# Patient Record
Sex: Female | Born: 1960 | Race: White | Hispanic: No | Marital: Married | Smoking: Never smoker
Health system: Southern US, Community
[De-identification: ages and names within clinical notes are randomized; demographics above are authoritative.]

## PROBLEM LIST (undated history)

## (undated) DIAGNOSIS — G478 Other sleep disorders: Secondary | ICD-10-CM

## (undated) DIAGNOSIS — R569 Unspecified convulsions: Secondary | ICD-10-CM

## (undated) DIAGNOSIS — D219 Benign neoplasm of connective and other soft tissue, unspecified: Secondary | ICD-10-CM

## (undated) DIAGNOSIS — G43909 Migraine, unspecified, not intractable, without status migrainosus: Secondary | ICD-10-CM

## (undated) DIAGNOSIS — L718 Other rosacea: Secondary | ICD-10-CM

## (undated) DIAGNOSIS — R748 Abnormal levels of other serum enzymes: Secondary | ICD-10-CM

## (undated) DIAGNOSIS — L439 Lichen planus, unspecified: Secondary | ICD-10-CM

## (undated) DIAGNOSIS — I341 Nonrheumatic mitral (valve) prolapse: Secondary | ICD-10-CM

## (undated) DIAGNOSIS — K5790 Diverticulosis of intestine, part unspecified, without perforation or abscess without bleeding: Secondary | ICD-10-CM

## (undated) DIAGNOSIS — M1990 Unspecified osteoarthritis, unspecified site: Secondary | ICD-10-CM

## (undated) DIAGNOSIS — K760 Fatty (change of) liver, not elsewhere classified: Secondary | ICD-10-CM

## (undated) DIAGNOSIS — T8859XA Other complications of anesthesia, initial encounter: Secondary | ICD-10-CM

## (undated) DIAGNOSIS — K529 Noninfective gastroenteritis and colitis, unspecified: Secondary | ICD-10-CM

## (undated) DIAGNOSIS — T4145XA Adverse effect of unspecified anesthetic, initial encounter: Secondary | ICD-10-CM

## (undated) DIAGNOSIS — K900 Celiac disease: Secondary | ICD-10-CM

## (undated) DIAGNOSIS — R011 Cardiac murmur, unspecified: Secondary | ICD-10-CM

## (undated) DIAGNOSIS — D510 Vitamin B12 deficiency anemia due to intrinsic factor deficiency: Secondary | ICD-10-CM

## (undated) DIAGNOSIS — G629 Polyneuropathy, unspecified: Secondary | ICD-10-CM

## (undated) DIAGNOSIS — J399 Disease of upper respiratory tract, unspecified: Secondary | ICD-10-CM

## (undated) HISTORY — DX: Vitamin B12 deficiency anemia due to intrinsic factor deficiency: D51.0

## (undated) HISTORY — DX: Noninfective gastroenteritis and colitis, unspecified: K52.9

## (undated) HISTORY — DX: Fatty (change of) liver, not elsewhere classified: K76.0

## (undated) HISTORY — PX: KNEE SURGERY: SHX244

## (undated) HISTORY — DX: Polyneuropathy, unspecified: G62.9

## (undated) HISTORY — DX: Abnormal levels of other serum enzymes: R74.8

## (undated) HISTORY — DX: Celiac disease: K90.0

## (undated) HISTORY — DX: Migraine, unspecified, not intractable, without status migrainosus: G43.909

## (undated) HISTORY — DX: Nonrheumatic mitral (valve) prolapse: I34.1

## (undated) HISTORY — DX: Benign neoplasm of connective and other soft tissue, unspecified: D21.9

## (undated) HISTORY — PX: TONSILLECTOMY: SUR1361

## (undated) HISTORY — DX: Diverticulosis of intestine, part unspecified, without perforation or abscess without bleeding: K57.90

## (undated) HISTORY — DX: Cardiac murmur, unspecified: R01.1

## (undated) HISTORY — DX: Other rosacea: L71.8

## (undated) HISTORY — PX: MOHS SURGERY: SUR867

## (undated) HISTORY — PX: ANKLE SURGERY: SHX546

## (undated) HISTORY — PX: INCISION AND DRAINAGE: SHX5863

## (undated) HISTORY — DX: Lichen planus, unspecified: L43.9

---

## 2002-12-13 ENCOUNTER — Other Ambulatory Visit: Admission: RE | Admit: 2002-12-13 | Discharge: 2002-12-13 | Payer: Self-pay | Admitting: Obstetrics & Gynecology

## 2003-06-04 ENCOUNTER — Emergency Department (HOSPITAL_COMMUNITY): Admission: AD | Admit: 2003-06-04 | Discharge: 2003-06-04 | Payer: Self-pay | Admitting: Family Medicine

## 2004-07-08 ENCOUNTER — Other Ambulatory Visit: Admission: RE | Admit: 2004-07-08 | Discharge: 2004-07-08 | Payer: Self-pay | Admitting: Obstetrics & Gynecology

## 2004-08-23 ENCOUNTER — Ambulatory Visit (HOSPITAL_COMMUNITY): Admission: RE | Admit: 2004-08-23 | Discharge: 2004-08-23 | Payer: Self-pay | Admitting: Obstetrics & Gynecology

## 2010-07-30 ENCOUNTER — Emergency Department (HOSPITAL_COMMUNITY): Payer: 59

## 2010-07-30 ENCOUNTER — Emergency Department (HOSPITAL_COMMUNITY)
Admission: EM | Admit: 2010-07-30 | Discharge: 2010-07-30 | Disposition: A | Discharge status: Home / Self Care | Payer: 59 | Attending: Emergency Medicine | Admitting: Emergency Medicine

## 2010-07-30 DIAGNOSIS — K900 Celiac disease: Secondary | ICD-10-CM | POA: Insufficient documentation

## 2010-07-30 DIAGNOSIS — M79609 Pain in unspecified limb: Secondary | ICD-10-CM | POA: Insufficient documentation

## 2010-07-30 DIAGNOSIS — M25519 Pain in unspecified shoulder: Secondary | ICD-10-CM | POA: Insufficient documentation

## 2010-07-30 DIAGNOSIS — M719 Bursopathy, unspecified: Secondary | ICD-10-CM | POA: Insufficient documentation

## 2010-07-30 DIAGNOSIS — M67919 Unspecified disorder of synovium and tendon, unspecified shoulder: Secondary | ICD-10-CM | POA: Insufficient documentation

## 2010-07-30 DIAGNOSIS — I059 Rheumatic mitral valve disease, unspecified: Secondary | ICD-10-CM | POA: Insufficient documentation

## 2010-07-30 LAB — CBC
HCT: 44 % (ref 36.0–46.0)
Hemoglobin: 14.8 g/dL (ref 12.0–15.0)
MCH: 34.5 pg — ABNORMAL HIGH (ref 26.0–34.0)
MCHC: 33.6 g/dL (ref 30.0–36.0)
MCV: 102.6 fL — ABNORMAL HIGH (ref 78.0–100.0)
Platelets: 251 10*3/uL (ref 150–400)
RBC: 4.29 MIL/uL (ref 3.87–5.11)
RDW: 12.9 % (ref 11.5–15.5)
WBC: 9.4 10*3/uL (ref 4.0–10.5)

## 2010-07-30 LAB — D-DIMER, QUANTITATIVE (NOT AT ARMC): D-Dimer, Quant: <0.22 ug/mL-FEU (ref 0.00–0.48)

## 2010-11-29 ENCOUNTER — Other Ambulatory Visit: Payer: Self-pay | Admitting: Gastroenterology

## 2010-11-29 DIAGNOSIS — R7989 Other specified abnormal findings of blood chemistry: Secondary | ICD-10-CM

## 2010-12-02 ENCOUNTER — Ambulatory Visit
Admission: RE | Admit: 2010-12-02 | Discharge: 2010-12-02 | Disposition: A | Discharge status: Home / Self Care | Payer: 59 | Source: Ambulatory Visit | Attending: Gastroenterology | Admitting: Gastroenterology

## 2010-12-02 DIAGNOSIS — R7989 Other specified abnormal findings of blood chemistry: Secondary | ICD-10-CM

## 2010-12-09 ENCOUNTER — Ambulatory Visit (HOSPITAL_COMMUNITY)
Admission: RE | Admit: 2010-12-09 | Discharge: 2010-12-09 | Disposition: A | Discharge status: Home / Self Care | Payer: 59 | Source: Ambulatory Visit | Attending: Gastroenterology | Admitting: Gastroenterology

## 2010-12-09 ENCOUNTER — Other Ambulatory Visit: Payer: Self-pay | Admitting: Gastroenterology

## 2010-12-09 DIAGNOSIS — K2990 Gastroduodenitis, unspecified, without bleeding: Secondary | ICD-10-CM | POA: Insufficient documentation

## 2010-12-09 DIAGNOSIS — K2970 Gastritis, unspecified, without bleeding: Secondary | ICD-10-CM | POA: Insufficient documentation

## 2010-12-09 DIAGNOSIS — K2980 Duodenitis without bleeding: Secondary | ICD-10-CM | POA: Insufficient documentation

## 2010-12-09 DIAGNOSIS — K219 Gastro-esophageal reflux disease without esophagitis: Secondary | ICD-10-CM | POA: Insufficient documentation

## 2011-05-01 ENCOUNTER — Other Ambulatory Visit: Payer: Self-pay | Admitting: Dermatology

## 2011-08-13 ENCOUNTER — Encounter (HOSPITAL_COMMUNITY): Payer: Self-pay

## 2011-08-13 ENCOUNTER — Ambulatory Visit (HOSPITAL_COMMUNITY): Admit: 2011-08-13 | Payer: Self-pay | Admitting: Gastroenterology

## 2011-08-13 SURGERY — EGD (ESOPHAGOGASTRODUODENOSCOPY)
Anesthesia: Moderate Sedation

## 2011-10-09 DIAGNOSIS — M76829 Posterior tibial tendinitis, unspecified leg: Secondary | ICD-10-CM | POA: Insufficient documentation

## 2011-10-09 DIAGNOSIS — M722 Plantar fascial fibromatosis: Secondary | ICD-10-CM | POA: Insufficient documentation

## 2012-11-09 ENCOUNTER — Telehealth: Payer: Self-pay | Admitting: Certified Nurse Midwife

## 2012-11-09 NOTE — Telephone Encounter (Signed)
Continue medication one more month and then advise, due scant amount in February

## 2012-11-09 NOTE — Telephone Encounter (Signed)
prescription question for a nurse

## 2012-11-09 NOTE — Telephone Encounter (Signed)
PATIENT STATES HAS HAD 3 MONTH OF NO CYCLE. STATES IN FEB HAD SCANT AMOUNT NOT ENOUGH TO U SE PANTI LINER . STATES IF SHE HAS NO CYCLE BY Saturday SHOULD SHE CONTINUE ON MEDICATION PRESCRIBED BY DR. Tresa Res AFTER PUS WAS DONE. OR STOP MEDICATION OR FOLLOW UP WITH YOU. CHART IN YOUR OFFICE.

## 2012-11-09 NOTE — Telephone Encounter (Signed)
Patient notified of need to continue medication per Ortencia Kick. Patient understands this.

## 2012-12-10 ENCOUNTER — Telehealth: Payer: Self-pay | Admitting: Certified Nurse Midwife

## 2012-12-10 NOTE — Telephone Encounter (Signed)
LMTCB  aa 

## 2012-12-10 NOTE — Telephone Encounter (Signed)
No cycle for 4 months

## 2012-12-10 NOTE — Telephone Encounter (Signed)
Spoke with pt about lack of menses. Pt was told by CR if she went 3 months with no bleeding, she was probably menopausal and would not be having periods anymore. Pt happy about that. Pt had a scant amt in February, and DL had her continue her medication in May just to be sure. No menses in May or June. If pt to continue the medication in July, she would need to start July 1. Pt just wanted to see if she is "done," or if she needs to continue for July.

## 2012-12-11 NOTE — Telephone Encounter (Signed)
Need chart please

## 2012-12-14 NOTE — Telephone Encounter (Signed)
LMTCB  aa 

## 2012-12-14 NOTE — Telephone Encounter (Signed)
Spoke with pt about SM advice. Pt happy. Pt will call with any bleeding or spotting. Pt knows that would be abnormal.

## 2012-12-14 NOTE — Telephone Encounter (Signed)
Reviewed chart.  She was to take Provera monthly.  If she has indeed taken it for three consecutive months and had not had bleeding, she does not need to take it anymore.  If she has any future bleeding, she needs to call as that is considered abnormal.

## 2012-12-29 DIAGNOSIS — H0011 Chalazion right upper eyelid: Secondary | ICD-10-CM | POA: Insufficient documentation

## 2013-02-18 ENCOUNTER — Ambulatory Visit: Payer: Self-pay | Admitting: Certified Nurse Midwife

## 2013-03-31 ENCOUNTER — Encounter: Payer: Self-pay | Admitting: Certified Nurse Midwife

## 2013-04-05 ENCOUNTER — Ambulatory Visit: Payer: Self-pay | Admitting: Certified Nurse Midwife

## 2013-05-04 ENCOUNTER — Ambulatory Visit: Payer: 59 | Admitting: Cardiovascular Disease

## 2013-05-25 ENCOUNTER — Encounter: Payer: Self-pay | Admitting: Cardiovascular Disease

## 2013-05-25 ENCOUNTER — Ambulatory Visit (INDEPENDENT_AMBULATORY_CARE_PROVIDER_SITE_OTHER): Payer: BC Managed Care – PPO | Admitting: Cardiovascular Disease

## 2013-05-25 VITALS — BP 130/90 | HR 88 | Ht 63.0 in | Wt 227.4 lb

## 2013-05-25 DIAGNOSIS — Z8249 Family history of ischemic heart disease and other diseases of the circulatory system: Secondary | ICD-10-CM

## 2013-05-25 DIAGNOSIS — E559 Vitamin D deficiency, unspecified: Secondary | ICD-10-CM

## 2013-05-25 DIAGNOSIS — R002 Palpitations: Secondary | ICD-10-CM

## 2013-05-25 DIAGNOSIS — G4733 Obstructive sleep apnea (adult) (pediatric): Secondary | ICD-10-CM

## 2013-05-25 DIAGNOSIS — I341 Nonrheumatic mitral (valve) prolapse: Secondary | ICD-10-CM

## 2013-05-25 DIAGNOSIS — I059 Rheumatic mitral valve disease, unspecified: Secondary | ICD-10-CM

## 2013-05-25 DIAGNOSIS — Z13220 Encounter for screening for lipoid disorders: Secondary | ICD-10-CM

## 2013-05-25 DIAGNOSIS — D510 Vitamin B12 deficiency anemia due to intrinsic factor deficiency: Secondary | ICD-10-CM

## 2013-05-25 DIAGNOSIS — R0683 Snoring: Secondary | ICD-10-CM

## 2013-05-25 DIAGNOSIS — R0609 Other forms of dyspnea: Secondary | ICD-10-CM

## 2013-05-25 DIAGNOSIS — R0989 Other specified symptoms and signs involving the circulatory and respiratory systems: Secondary | ICD-10-CM

## 2013-05-25 DIAGNOSIS — R5383 Other fatigue: Secondary | ICD-10-CM

## 2013-05-25 DIAGNOSIS — Z136 Encounter for screening for cardiovascular disorders: Secondary | ICD-10-CM

## 2013-05-25 DIAGNOSIS — L719 Rosacea, unspecified: Secondary | ICD-10-CM

## 2013-05-25 DIAGNOSIS — IMO0001 Reserved for inherently not codable concepts without codable children: Secondary | ICD-10-CM

## 2013-05-25 DIAGNOSIS — R5381 Other malaise: Secondary | ICD-10-CM

## 2013-05-25 NOTE — Patient Instructions (Signed)
Your physician recommends that you return for lab work fasting.  Your physician has recommended that you have a sleep study. This test records several body functions during sleep, including: brain activity, eye movement, oxygen and carbon dioxide blood levels, heart rate and rhythm, breathing rate and rhythm, the flow of air through your mouth and nose, snoring, body muscle movements, and chest and belly movement.  Your physician has requested that you have an echocardiogram. Echocardiography is a painless test that uses sound waves to create images of your heart. It provides your doctor with information about the size and shape of your heart and how well your heart's chambers and valves are working. This procedure takes approximately one hour. There are no restrictions for this procedure.  Your physician has requested that you have an exercise tolerance test. For further information please visit [email or web address] Please also follow instruction sheet, as given.  Your physician recommends that you schedule a follow-up appointment in: 6 WEEKS.

## 2013-05-26 LAB — TSH: TSH: 3.061 u[IU]/mL (ref 0.350–4.500)

## 2013-05-26 LAB — COMPREHENSIVE METABOLIC PANEL
ALT: 105 U/L — ABNORMAL HIGH (ref 0–35)
AST: 92 U/L — ABNORMAL HIGH (ref 0–37)
Albumin: 4 g/dL (ref 3.5–5.2)
Alkaline Phosphatase: 89 U/L (ref 39–117)
BUN: 14 mg/dL (ref 6–23)
CO2: 27 mEq/L (ref 19–32)
Calcium: 9.3 mg/dL (ref 8.4–10.5)
Chloride: 105 mEq/L (ref 96–112)
Creat: 0.66 mg/dL (ref 0.50–1.10)
Glucose, Bld: 99 mg/dL (ref 70–99)
Potassium: 5 mEq/L (ref 3.5–5.3)
Sodium: 140 mEq/L (ref 135–145)
Total Bilirubin: 0.6 mg/dL (ref 0.3–1.2)
Total Protein: 7.4 g/dL (ref 6.0–8.3)

## 2013-05-26 LAB — LIPID PANEL
Cholesterol: 165 mg/dL (ref 0–200)
HDL: 45 mg/dL (ref >39)
LDL Cholesterol: 103 mg/dL — ABNORMAL HIGH (ref 0–99)
Total CHOL/HDL Ratio: 3.7 Ratio
Triglycerides: 85 mg/dL (ref <150)
VLDL: 17 mg/dL (ref 0–40)

## 2013-05-26 LAB — CBC WITH DIFFERENTIAL/PLATELET
Basophils Absolute: 0 10*3/uL (ref 0.0–0.1)
Basophils Relative: 1 % (ref 0–1)
Eosinophils Absolute: 0.2 10*3/uL (ref 0.0–0.7)
Eosinophils Relative: 3 % (ref 0–5)
HCT: 45.4 % (ref 36.0–46.0)
Hemoglobin: 15.3 g/dL — ABNORMAL HIGH (ref 12.0–15.0)
Lymphocytes Relative: 38 % (ref 12–46)
Lymphs Abs: 3.1 10*3/uL (ref 0.7–4.0)
MCH: 34.2 pg — ABNORMAL HIGH (ref 26.0–34.0)
MCHC: 33.7 g/dL (ref 30.0–36.0)
MCV: 101.3 fL — ABNORMAL HIGH (ref 78.0–100.0)
Monocytes Absolute: 0.7 10*3/uL (ref 0.1–1.0)
Monocytes Relative: 8 % (ref 3–12)
Neutro Abs: 4.2 10*3/uL (ref 1.7–7.7)
Neutrophils Relative %: 50 % (ref 43–77)
Platelets: 241 10*3/uL (ref 150–400)
RBC: 4.48 MIL/uL (ref 3.87–5.11)
RDW: 13.5 % (ref 11.5–15.5)
WBC: 8.2 10*3/uL (ref 4.0–10.5)

## 2013-05-26 LAB — SEDIMENTATION RATE: Sed Rate: 15 mm/hr (ref 0–22)

## 2013-05-26 LAB — VITAMIN B12: Vitamin B-12: 368 pg/mL (ref 211–911)

## 2013-05-30 LAB — VITAMIN D 1,25 DIHYDROXY
Vitamin D 1, 25 (OH)2 Total: 51 pg/mL (ref 18–72)
Vitamin D2 1, 25 (OH)2: 27 pg/mL
Vitamin D3 1, 25 (OH)2: 24 pg/mL

## 2013-06-01 DIAGNOSIS — M545 Low back pain, unspecified: Secondary | ICD-10-CM | POA: Insufficient documentation

## 2013-06-02 ENCOUNTER — Telehealth: Payer: Self-pay | Admitting: Cardiovascular Disease

## 2013-06-02 ENCOUNTER — Encounter: Payer: Self-pay | Admitting: Cardiovascular Disease

## 2013-06-02 DIAGNOSIS — Z8249 Family history of ischemic heart disease and other diseases of the circulatory system: Secondary | ICD-10-CM | POA: Insufficient documentation

## 2013-06-02 DIAGNOSIS — R002 Palpitations: Secondary | ICD-10-CM | POA: Insufficient documentation

## 2013-06-02 DIAGNOSIS — D510 Vitamin B12 deficiency anemia due to intrinsic factor deficiency: Secondary | ICD-10-CM | POA: Insufficient documentation

## 2013-06-02 DIAGNOSIS — G4733 Obstructive sleep apnea (adult) (pediatric): Secondary | ICD-10-CM | POA: Insufficient documentation

## 2013-06-02 DIAGNOSIS — IMO0001 Reserved for inherently not codable concepts without codable children: Secondary | ICD-10-CM | POA: Insufficient documentation

## 2013-06-02 DIAGNOSIS — I341 Nonrheumatic mitral (valve) prolapse: Secondary | ICD-10-CM | POA: Insufficient documentation

## 2013-06-02 DIAGNOSIS — E559 Vitamin D deficiency, unspecified: Secondary | ICD-10-CM | POA: Insufficient documentation

## 2013-06-02 DIAGNOSIS — H01009 Unspecified blepharitis unspecified eye, unspecified eyelid: Secondary | ICD-10-CM | POA: Insufficient documentation

## 2013-06-02 NOTE — Progress Notes (Signed)
Patient ID: Helen Jackson, female   DOB: 05/26/1961, 52 y.o.   MRN: [id number]     PATIENT PROFILE: Helen Jackson is a 52 year old female who is self-referred for evaluation of shortness of breath, mitral valve prolapse, concerns for sleep apnea, and family history for peripheral arterial disease.   HPI:  Helen Jackson was diagnosed with mitral valve prolapse in her 25s. While she was in college, she was found to have pernicious anemia. Her mother suffered a stroke in her 5s as did her maternal grandmother and  her mother suffered myocardial infarctions in her 67s and 41s. She states that over the past 10 years, she has gained approximately 80 pounds. She is now menopausal over the past 6 months. 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 Recently, she has noticed episodes of shortness of breath as well as leg swelling. Addition, over the past 6-8 weeks she believes that she has become much more short of breath and has gained 12 pounds over the 6 week period. She also has begun to notice significant difficulty with sleep. Typically she goes to bed at 10:30 and wakes up at 7 AM. She believes her sleep is nonrestorative. 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 He also notes some occasional palpitations. She also has had issues with vitamin D insufficiency and has required 50,000 units of vitamin D supplementation weekly. Because of these multiple concerns, she now presents for evaluation  Past Medical History  Diagnosis Date  . Pernicious anemia   . Celiac disease   . Peripheral neuropathy   . Ocular rosacea   . MVP (mitral valve prolapse)   . Heart murmur     Past Surgical History  Procedure Laterality Date  . Ankle surgery    . Knee surgery      bilateral  . Cesarean section    . Incision and drainage      bartholin cyst  . Tonsillectomy      Allergies  Allergen Reactions  . Almond Oil     almonds  . Eggs Or Egg-Derived Products   . Erythromycin    . Gentamicin   . Indomethacin   . Pyridium [Phenazopyridine Hcl]   . Soybean-Containing Drug Products   . Wheat Bran     Current Outpatient Prescriptions  Medication Sig Dispense Refill  . clotrimazole (MYCELEX) 10 MG troche Take 10 mg by mouth daily.      . Cyanocobalamin (VITAMIN B 12 PO) Take by mouth daily.      . metroNIDAZOLE (METROGEL) 1 % gel Apply 1 application topically at bedtime.      . Cholecalciferol (VITAMIN D PO) Take 5,000 Int'l Units by mouth daily.       No current facility-administered medications for this visit.    Social history is notable in that she is married for 31 years. She lives at home with her spouse and son. There is no history of any tobacco or alcohol use. 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 She does walk but recently has not been able to do this secondary to plantar fasciitis.  Family History  Problem Relation Age of Onset  . Osteoporosis Mother   . CVA Mother   . Cancer Father     pancreatic  . Breast cancer Paternal Aunt     ROS is negative for fever chills or night sweats. She admits to an 80 pound  weight gain over the past 10 years and a 12 pound weight gain over the past 6 weeks. 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 She also had difficulty with dry eyes. She denies hearing changes. She denies lymphadenopathy. She denies cough or increased sputum production. She does admit to shortness of breath particularly with minimal activity. Positive for intermittent palpitations. She denies chest pressure. There is no PND or orthopnea. She admits to obesity. She denies nausea vomiting or diarrhea. She is unaware of blood in her stool or urine. She now is in menopause since June 2014. She denies claudication. She denies myalgias. She does note lower extremity edema. She has musculoskeletal issues with  plantar fasciitis. She also is to see an ankle doctor at [facility]. She is unaware of any seizure activity. Hematologically she does have a history of pernicious anemia. There is also a history of vitamin D insufficiency. She is unaware of diabetes. She is unaware of hypothyroidism. From a sleep prospective she does admit to loud snoring, bruxism, and nonrestorative sleep as well as excessive daytime sleepiness. Other comprehensive 14 point system review is negative.  PE BP 130/90  Pulse 88  Ht 5\' 3"  (1.6 m)  Wt 227 lb 6.4 oz (103.148 kg)  BMI 40.29 kg/m2 General: Alert, oriented, no distress. Morbidly obese by BMI standards. Skin: normal turgor, no rashes HEENT: Normocephalic, atraumatic. Pupils round and reactive; sclera anicteric; Fundi without hemorrhages or exudate Nose without nasal septal hypertrophy Mouth/Parynx benign; Mallinpatti scale 3/4 Neck: No JVD, no carotid bruits Lungs: clear to ausculatation and percussion; no wheezing or rales Chest wall: without tenderness to palpitation Heart: RRR, s1 s2 normal 1/6 systolic murmur. No definitive click. Abdomen: Moderate obesity. soft, nontender; no hepatosplenomehaly, BS+; abdominal aorta nontender and not dilated by palpation. Back: no CVA tenderness Pulses 2+ Extremities: no clubbinbg cyanosis or edema, Homan's sign negative  Neurologic: grossly nonfocal; Cranial nerves grossly wnl Psychologic: Normal mood and affect    ECG: Sinus rhythm at 88 beats per minute several isolated unifocal PVCs with retrograde P wave after the PVC. QTc interval 442 ms  LABS:  BMET    Component Value Date/Time   NA 140 05/25/2013 0802   K 5.0 05/25/2013 0802   CL 105 05/25/2013 0802   CO2 27 05/25/2013 0802   GLUCOSE 99 05/25/2013 0802   BUN 14 05/25/2013 0802   CREATININE 0.66 05/25/2013 0802   CALCIUM 9.3 05/25/2013 0802     Hepatic Function Panel     Component Value Date/Time   PROT 7.4 05/25/2013 0802   ALBUMIN 4.0  05/25/2013 0802   AST 92* 05/25/2013 0802   ALT 105* 05/25/2013 0802   ALKPHOS 89 05/25/2013 0802   BILITOT 0.6 05/25/2013 0802     CBC    Component Value Date/Time   WBC 8.2 05/25/2013 0802   RBC 4.48 05/25/2013 0802   HGB 15.3* 05/25/2013 0802   HCT 45.4 05/25/2013 0802   PLT 241 05/25/2013 0802   MCV 101.3* 05/25/2013 0802   MCH 34.2* 05/25/2013 0802   MCHC 33.7 05/25/2013 0802   RDW 13.5 05/25/2013 0802   LYMPHSABS 3.1 05/25/2013 0802   MONOABS 0.7 05/25/2013 0802   EOSABS 0.2 05/25/2013 0802   BASOSABS 0.0 05/25/2013 0802     BNP No results found for this basename: probnp    Lipid Panel     Component Value Date/Time   CHOL 165 05/25/2013  0802   TRIG 85 05/25/2013 0802   HDL 45 05/25/2013 0802   CHOLHDL 3.7 05/25/2013 0802   VLDL 17 05/25/2013 0802   LDLCALC 103* 05/25/2013 0802     RADIOLOGY: No results found.   ASSESSMENT AND PLAN: I had a lengthy discussion with Helen Jackson. She is a 52 year old morbidly obese female who has a diagnosis of mitral valve prolapse for approximately 25 years, a history of pernicious anemia, who as gained approximately 80 pounds over the last 10 year period. She does have PVCs noted on ECG which may explain her palpitations. She has begun to notice a decline in exercise tolerance with shortness of breath. Her history is also highly suggestive of obstructive sleep apnea particularly now that she is postmenopausal which increases the likelihood for sleep apnea development. Presently, I'm recommending a comprehensive laboratory assessment consisting of B12 level, CBC with differential, CMP, and erythrocyte sedimentation rate, fasting lipid panel, TSH level, and vitamin D level. I am scheduling her for a 2-D echo Doppler study to reassess her LV systolic and diastolic function and mitral valve disorder. I'm scheduling her for a routine treadmill test for initial CAD screening. I'm also scheduling her for a diagnostic polysomnogram to  assess for obstructive sleep apnea. These tests will be done over the next several weeks and I will see her in 6 weeks for followup neurologic evaluation.   Lennette Bihari, MD, [facility] 06/02/2013 2:00 PM

## 2013-06-02 NOTE — Telephone Encounter (Signed)
Spoke with patient informing her that Dr. Tresa Endo hasn't reviewed her studies. Once he has they can be released to her ion my chart. He will typically go over them at her follow up appointment unless they are really abnormal. Patient voiced understanding and will look for them on my chart.

## 2013-06-02 NOTE — Telephone Encounter (Signed)
Would like lab results from 12-11 please.

## 2013-06-03 ENCOUNTER — Other Ambulatory Visit: Payer: Self-pay | Admitting: *Deleted

## 2013-06-03 ENCOUNTER — Telehealth: Payer: Self-pay | Admitting: *Deleted

## 2013-06-03 DIAGNOSIS — R7989 Other specified abnormal findings of blood chemistry: Secondary | ICD-10-CM

## 2013-06-03 NOTE — Telephone Encounter (Signed)
Left message to return phone call.

## 2013-06-03 NOTE — Telephone Encounter (Signed)
Message copied by Gaynelle Cage on Fri Jun 03, 2013 10:08 AM ------      Message from: Nicki Guadalajara A      Created: Thu Jun 02, 2013  5:08 PM       Labs ok X inc LFT's  ? Fatty liver; consider [place] of liver to assess. ------

## 2013-06-06 ENCOUNTER — Ambulatory Visit (HOSPITAL_COMMUNITY)
Admission: RE | Admit: 2013-06-06 | Discharge: 2013-06-06 | Disposition: A | Discharge status: Home / Self Care | Payer: BC Managed Care – PPO | Source: Ambulatory Visit | Attending: Cardiology | Admitting: Cardiology

## 2013-06-06 DIAGNOSIS — I341 Nonrheumatic mitral (valve) prolapse: Secondary | ICD-10-CM

## 2013-06-06 DIAGNOSIS — I059 Rheumatic mitral valve disease, unspecified: Secondary | ICD-10-CM | POA: Insufficient documentation

## 2013-06-06 DIAGNOSIS — Z8249 Family history of ischemic heart disease and other diseases of the circulatory system: Secondary | ICD-10-CM | POA: Insufficient documentation

## 2013-06-06 NOTE — Progress Notes (Signed)
2D Echo Performed 06/06/2013    Sian Rockers, RCS  

## 2013-06-07 ENCOUNTER — Ambulatory Visit (HOSPITAL_COMMUNITY)
Admission: RE | Admit: 2013-06-07 | Discharge: 2013-06-07 | Disposition: A | Discharge status: Home / Self Care | Payer: BC Managed Care – PPO | Source: Ambulatory Visit | Attending: Cardiovascular Disease | Admitting: Cardiovascular Disease

## 2013-06-07 DIAGNOSIS — R5381 Other malaise: Secondary | ICD-10-CM

## 2013-06-07 DIAGNOSIS — R5383 Other fatigue: Secondary | ICD-10-CM

## 2013-06-07 DIAGNOSIS — Z8249 Family history of ischemic heart disease and other diseases of the circulatory system: Secondary | ICD-10-CM

## 2013-06-17 ENCOUNTER — Ambulatory Visit
Admission: RE | Admit: 2013-06-17 | Discharge: 2013-06-17 | Disposition: A | Discharge status: Home / Self Care | Payer: BC Managed Care – PPO | Source: Ambulatory Visit | Attending: Cardiovascular Disease | Admitting: Cardiovascular Disease

## 2013-06-17 DIAGNOSIS — R7989 Other specified abnormal findings of blood chemistry: Secondary | ICD-10-CM

## 2013-06-24 ENCOUNTER — Telehealth: Payer: Self-pay | Admitting: *Deleted

## 2013-06-27 ENCOUNTER — Ambulatory Visit: Payer: Self-pay | Admitting: Cardiovascular Disease

## 2013-07-13 ENCOUNTER — Encounter: Payer: Self-pay | Admitting: Cardiovascular Disease

## 2013-07-13 ENCOUNTER — Ambulatory Visit (INDEPENDENT_AMBULATORY_CARE_PROVIDER_SITE_OTHER): Payer: BC Managed Care – PPO | Admitting: Cardiovascular Disease

## 2013-07-13 VITALS — BP 135/80 | HR 90 | Ht 63.25 in | Wt 229.5 lb

## 2013-07-13 DIAGNOSIS — Z13220 Encounter for screening for lipoid disorders: Secondary | ICD-10-CM

## 2013-07-13 DIAGNOSIS — D510 Vitamin B12 deficiency anemia due to intrinsic factor deficiency: Secondary | ICD-10-CM

## 2013-07-13 DIAGNOSIS — I059 Rheumatic mitral valve disease, unspecified: Secondary | ICD-10-CM

## 2013-07-13 DIAGNOSIS — Z8249 Family history of ischemic heart disease and other diseases of the circulatory system: Secondary | ICD-10-CM

## 2013-07-13 DIAGNOSIS — Z79899 Other long term (current) drug therapy: Secondary | ICD-10-CM

## 2013-07-13 DIAGNOSIS — Z87898 Personal history of other specified conditions: Secondary | ICD-10-CM

## 2013-07-13 DIAGNOSIS — G478 Other sleep disorders: Secondary | ICD-10-CM

## 2013-07-13 DIAGNOSIS — E8881 Metabolic syndrome: Secondary | ICD-10-CM

## 2013-07-13 DIAGNOSIS — I341 Nonrheumatic mitral (valve) prolapse: Secondary | ICD-10-CM

## 2013-07-13 DIAGNOSIS — R002 Palpitations: Secondary | ICD-10-CM

## 2013-07-13 DIAGNOSIS — Z8679 Personal history of other diseases of the circulatory system: Secondary | ICD-10-CM

## 2013-07-13 NOTE — Patient Instructions (Signed)
No changes were made today in your therapy.Your physician recommends that you schedule a follow-up appointment in: 3 months.  Your physician recommends that you return for lab work in: 3 months.

## 2013-07-14 ENCOUNTER — Encounter: Payer: Self-pay | Admitting: Cardiovascular Disease

## 2013-07-14 DIAGNOSIS — G478 Other sleep disorders: Secondary | ICD-10-CM | POA: Insufficient documentation

## 2013-07-14 NOTE — Progress Notes (Signed)
Patient ID: Helen Jackson, female   DOB: 03-17-1961, 53 y.o.   MRN: [id number]     HPI: Helen Jackson is a 53 year old female who presents for followup Cardiologic evaluation. I saw her one month ago for evaluation of shortness of breath, mitral valve prolapse, concerns for sleep apnea, and family history for peripheral arterial disease.   Helen Jackson was diagnosed with mitral valve prolapse in her 32s. While she was in college, she was found to have pernicious anemia. Her mother suffered a stroke in her 67s as did her maternal grandmother and  her mother suffered myocardial infarctions in her 77s and 17s. She states that over the past 10 years, she has gained approximately 80 pounds. She is now menopausal over the past 6 months. 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 Recently, she has noticed episodes of shortness of breath as well as leg swelling. Over the past 2 months she believes that she has become much more short of breath and has gained 12 pounds over the 6 week period. She also has begun to notice significant difficulty with sleep. Typically she goes to bed at 10:30 and wakes up at 7 AM. She believes her sleep is nonrestorative. 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 He also notes some occasional palpitations. She also has had issues with vitamin D insufficiency and has required 50,000 units of vitamin D supplementation weekly. Because of these multiple concerns,  presents for evaluation as month.  She underwent a routine treadmill test which was negative for ischemia. She was able to exercise to an 8.6 met workload and achieved a maximal heart rate of 173 beats per minute. There were nonspecific ST-T changes not diagnostic of ischemia.  An echo Doppler study was done on 06/11/2013. This showed an ejection fraction at 60-65%. There was systolic bowing of her mitral valve without definitive prolapse seen on this study. There is trivial mitral regurgitation.  She had normal diastolic parameters.  She underwent a sleep study which suggested increased upper airway resistance syndrome (UARS) without definitive sleep apnea. Her AHI was 2.0. However, her respiratory disturbance index was 13.9. Her lowest oxygen desaturation was 90%. She did have rare periodic limb movements with an index of 1.1 per hour.  She tells me that she'll be undergoing nerve conduction velocity studies at [facility] to her lower extremities next week.  She also had laboratory which did reveal increased liver function studies with an ALT of 105 and an AST of 92. Her exercise sedimentation rate was 15. Vitamin D level was normal at 51. Her B12 level was normal. However, she did have macrocytic indices. Because of an index of suspicion for fatty liver, artery further for an abdominal ultrasound. The liver did appear echogenic diffusely and was felt consistent with a fatty liver pattern. No acute findings were demonstrated.   Past Medical History  Diagnosis Date  . Pernicious anemia   . Celiac disease   . Peripheral neuropathy   . Ocular rosacea   . MVP (mitral valve prolapse)   . Heart murmur     Past Surgical History  Procedure Laterality Date  . Ankle surgery    . Knee surgery      bilateral  . Cesarean section    . Incision and drainage      bartholin cyst  . Tonsillectomy      Allergies  Allergen Reactions  . Almond Oil     almonds  . Eggs  Or Egg-Derived Products   . Erythromycin   . Gentamicin   . Indomethacin   . Pyridium [Phenazopyridine Hcl]   . Soybean-Containing Drug Products   . Wheat Bran     Current Outpatient Prescriptions  Medication Sig Dispense Refill  . cephALEXin (KEFLEX) 500 MG capsule Take 500 mg by mouth. Take 1 hour dental work      . Cholecalciferol (VITAMIN D PO) Take 5,000 Int'l Units by mouth daily.      . clotrimazole (MYCELEX) 10 MG troche Take 10 mg by mouth as needed.      . metroNIDAZOLE (METROGEL) 1 % gel Apply 1  application topically at bedtime.      . Cyanocobalamin (VITAMIN B 12 PO) Take by mouth daily.       No current facility-administered medications for this visit.    Social history is notable in that she is married for 31 years. She lives at home with her spouse and son. There is no history of any tobacco or alcohol use. 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 She does walk but recently has not been able to do this secondary to plantar fasciitis.  Family History  Problem Relation Age of Onset  . Osteoporosis Mother   . CVA Mother   . Cancer Father     pancreatic  . Breast cancer Paternal Aunt     ROS is negative for fever chills or night sweats. She admits to an 80 pound weight gain over the past 10 years and a 12 pound weight gain over the past 6 weeks. 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 She also had difficulty with dry eyes. She denies hearing changes. She denies lymphadenopathy. She denies cough or increased sputum production. She does admit to shortness of breath particularly with minimal activity. Positive for intermittent palpitations. She denies chest pressure. There is no PND or orthopnea. She admits to obesity. She denies nausea vomiting or diarrhea. She is unaware of blood in her stool or urine. She now is in menopause since June 2014. She denies claudication. She denies myalgias. She does note lower extremity edema. She has musculoskeletal issues with plantar fasciitis. She also is to see an ankle doctor at [facility]. She is unaware of any seizure activity. Hematologically she does have a history of pernicious anemia. There is also a history of vitamin D insufficiency. She is unaware of diabetes. She is unaware of hypothyroidism. From a sleep prospective she does admit to loud snoring, bruxism, and nonrestorative sleep as well  as excessive daytime sleepiness. Other comprehensive 14 point system review is negative.  PE BP 135/80  Pulse 90  Ht 5' 3.25" (1.607 m)  Wt 229 lb 8 oz (104.101 kg)  BMI 40.31 kg/m2 General: Alert, oriented, no distress. Morbidly obese by BMI standards. Skin: normal turgor, no rashes HEENT: Normocephalic, atraumatic. Pupils round and reactive; sclera anicteric; Fundi without hemorrhages or exudate Nose without nasal septal hypertrophy Mouth/Parynx benign; Mallinpatti scale 3/4 Neck: No JVD, no carotid bruits Lungs: clear to ausculatation and percussion; no wheezing or rales Chest wall: without tenderness to palpitation Heart: RRR, s1 s2 normal 1/6 systolic murmur. No definitive click. Abdomen: Moderate obesity. soft, nontender; no hepatosplenomehaly, BS+; abdominal aorta nontender and not dilated by palpation. Back: no CVA tenderness Pulses 2+ Extremities: no clubbinbg cyanosis or edema, Homan's sign negative  Neurologic:  grossly nonfocal; Cranial nerves grossly wnl Psychologic: Normal mood and affect    ECG from 06/06/2013 (independently read by me): Sinus rhythm at 88 beats per minute several isolated unifocal PVCs with retrograde P wave after the PVC. QTc interval 442 ms  LABS:  BMET    Component Value Date/Time   NA 140 05/25/2013 0802   K 5.0 05/25/2013 0802   CL 105 05/25/2013 0802   CO2 27 05/25/2013 0802   GLUCOSE 99 05/25/2013 0802   BUN 14 05/25/2013 0802   CREATININE 0.66 05/25/2013 0802   CALCIUM 9.3 05/25/2013 0802     Hepatic Function Panel     Component Value Date/Time   PROT 7.4 05/25/2013 0802   ALBUMIN 4.0 05/25/2013 0802   AST 92* 05/25/2013 0802   ALT 105* 05/25/2013 0802   ALKPHOS 89 05/25/2013 0802   BILITOT 0.6 05/25/2013 0802     CBC    Component Value Date/Time   WBC 8.2 05/25/2013 0802   RBC 4.48 05/25/2013 0802   HGB 15.3* 05/25/2013 0802   HCT 45.4 05/25/2013 0802   PLT 241 05/25/2013 0802   MCV 101.3* 05/25/2013 0802   MCH  34.2* 05/25/2013 0802   MCHC 33.7 05/25/2013 0802   RDW 13.5 05/25/2013 0802   LYMPHSABS 3.1 05/25/2013 0802   MONOABS 0.7 05/25/2013 0802   EOSABS 0.2 05/25/2013 0802   BASOSABS 0.0 05/25/2013 0802     BNP No results found for this basename: probnp    Lipid Panel     Component Value Date/Time   CHOL 165 05/25/2013 0802   TRIG 85 05/25/2013 0802   HDL 45 05/25/2013 0802   CHOLHDL 3.7 05/25/2013 0802   VLDL 17 05/25/2013 0802   LDLCALC 103* 05/25/2013 0802     RADIOLOGY: No results found.   ASSESSMENT AND PLAN: She is a 53 year old morbidly obese female who has a diagnosis of mitral valve prolapse for approximately 25 years, a history of pernicious anemia, who as gained approximately 80 pounds over the last 10 year period. I did review with her in detail her exercise treadmill test, 2-D echo Doppler study, as well as pulmonary results of her sleep study. Her stress test did not show any ischemic ST-T changes and she was able to exercise to a peak heart rate of 173 beats per minute, representing 102% of age-predicted maximal heart rate heard there was no chest pain or ECG changes of ischemia. Her echo Doppler study confirms normal systolic function and she had trivial mitral regurgitation with systolic bowing of her mitral valve without definitive prolapse on this study. She has gained a significant amount of weight over the past 10 years. Her liver function studies are elevated and most likely due to capacity and areola. I discussed with her the importance of this significant weight loss and exercise. If her liver functions do not improve, be worthwhile to refer her for GI evaluation. In 3 months, I am re\re checking laboratory with a copy has a metabolic panel and we'll also check lipids, and insulin level, as well as CBC. She does have increased upper airway resistance syndrome but at present does not meet criteria for CPAP administration. This should improve with weight loss and  alternatives for snoring may be considered such as an oral dental appliance if necessary. I will see her in the office in 3 months for followup evaluation.  Troy Sine, MD, [facility] 07/14/2013 10:11 PM

## 2013-07-20 ENCOUNTER — Encounter: Payer: Self-pay | Admitting: Cardiovascular Disease

## 2013-08-15 ENCOUNTER — Telehealth: Payer: Self-pay | Admitting: Certified Nurse Midwife

## 2013-08-15 NOTE — Telephone Encounter (Signed)
Patient wants to know when her TSH was last tested and what the results were.

## 2013-08-15 NOTE — Telephone Encounter (Signed)
Spoke with pt who is having some eye issues and was calling around to her different doctors to get TSH results per her eye MD's request. Gave result from [facility] for 05-2013 which was 3.061. Paper chart result from 10-2010 was 3.050. Pt appreciative.

## 2013-10-11 LAB — COMPREHENSIVE METABOLIC PANEL
ALT: 80 U/L — ABNORMAL HIGH (ref 0–35)
AST: 64 U/L — ABNORMAL HIGH (ref 0–37)
Albumin: 3.7 g/dL (ref 3.5–5.2)
Alkaline Phosphatase: 72 U/L (ref 39–117)
BUN: 16 mg/dL (ref 6–23)
CO2: 27 mEq/L (ref 19–32)
Calcium: 9 mg/dL (ref 8.4–10.5)
Chloride: 105 mEq/L (ref 96–112)
Creat: 0.65 mg/dL (ref 0.50–1.10)
Glucose, Bld: 87 mg/dL (ref 70–99)
Potassium: 4.7 mEq/L (ref 3.5–5.3)
Sodium: 139 mEq/L (ref 135–145)
Total Bilirubin: 0.6 mg/dL (ref 0.2–1.2)
Total Protein: 6.5 g/dL (ref 6.0–8.3)

## 2013-10-11 LAB — CBC
HCT: 43.2 % (ref 36.0–46.0)
Hemoglobin: 14.5 g/dL (ref 12.0–15.0)
MCH: 33.5 pg (ref 26.0–34.0)
MCHC: 33.6 g/dL (ref 30.0–36.0)
MCV: 99.8 fL (ref 78.0–100.0)
Platelets: 219 10*3/uL (ref 150–400)
RBC: 4.33 MIL/uL (ref 3.87–5.11)
RDW: 13.7 % (ref 11.5–15.5)
WBC: 7.7 10*3/uL (ref 4.0–10.5)

## 2013-10-11 LAB — LIPID PANEL
Cholesterol: 155 mg/dL (ref 0–200)
HDL: 42 mg/dL (ref >39)
LDL Cholesterol: 95 mg/dL (ref 0–99)
Total CHOL/HDL Ratio: 3.7 Ratio
Triglycerides: 91 mg/dL (ref <150)
VLDL: 18 mg/dL (ref 0–40)

## 2013-10-12 LAB — INSULIN, FASTING: Insulin fasting, serum: 37 u[IU]/mL — ABNORMAL HIGH (ref 3–28)

## 2013-10-14 NOTE — Telephone Encounter (Signed)
Encounter Closed 10/14/13 TP 

## 2013-11-16 ENCOUNTER — Ambulatory Visit: Payer: Self-pay | Admitting: Cardiovascular Disease

## 2014-01-02 ENCOUNTER — Ambulatory Visit (INDEPENDENT_AMBULATORY_CARE_PROVIDER_SITE_OTHER): Payer: BC Managed Care – PPO | Admitting: Cardiovascular Disease

## 2014-01-02 ENCOUNTER — Encounter: Payer: Self-pay | Admitting: Cardiovascular Disease

## 2014-01-02 VITALS — BP 130/84 | HR 79 | Ht 63.25 in | Wt 225.5 lb

## 2014-01-02 DIAGNOSIS — R945 Abnormal results of liver function studies: Secondary | ICD-10-CM

## 2014-01-02 DIAGNOSIS — G4733 Obstructive sleep apnea (adult) (pediatric): Secondary | ICD-10-CM

## 2014-01-02 DIAGNOSIS — I341 Nonrheumatic mitral (valve) prolapse: Secondary | ICD-10-CM

## 2014-01-02 DIAGNOSIS — R0789 Other chest pain: Secondary | ICD-10-CM

## 2014-01-02 DIAGNOSIS — R7989 Other specified abnormal findings of blood chemistry: Secondary | ICD-10-CM

## 2014-01-02 DIAGNOSIS — D510 Vitamin B12 deficiency anemia due to intrinsic factor deficiency: Secondary | ICD-10-CM

## 2014-01-02 DIAGNOSIS — G478 Other sleep disorders: Secondary | ICD-10-CM

## 2014-01-02 DIAGNOSIS — I059 Rheumatic mitral valve disease, unspecified: Secondary | ICD-10-CM

## 2014-01-02 DIAGNOSIS — Z8249 Family history of ischemic heart disease and other diseases of the circulatory system: Secondary | ICD-10-CM

## 2014-01-02 DIAGNOSIS — R002 Palpitations: Secondary | ICD-10-CM

## 2014-01-02 NOTE — Patient Instructions (Addendum)
Your physician recommends that you schedule a follow-up appointment in:SIX Months with Dr. Claiborne Billings  Your physician recommends that you return for lab work.

## 2014-01-02 NOTE — Progress Notes (Signed)
Patient ID: Helen Jackson, female   DOB: 1960/10/13, 53 y.o.   MRN: [id number]     HPI: Helen Jackson is a 53 year old female who presents for 6 month followup Cardiologic evaluation.  Helen Jackson was diagnosed with mitral valve prolapse in her 89s. While  in college, she was found to have pernicious anemia. Her mother suffered a stroke in her 54s as did her maternal grandmother and  her mother suffered myocardial infarctions in her 51s and 65s. She states that over the past 10 years, she has gained approximately 80 pounds. She is now menopausal. 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 Recently, she has noticed episodes of shortness of breath as well as leg swelling. Over the past 2 months she believes that she has become much more short of breath and has gained 12 pounds over the 6 week period. She also has begun to notice significant difficulty with sleep. Typically she goes to bed at 10:30 and wakes up at 7 AM. She believes her sleep is nonrestorative. 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 He also notes some occasional palpitations. She also has had issues with vitamin D insufficiency and has required 50,000 units of vitamin D supplementation weekly. Because of these multiple concerns,  presents for evaluation as month.  She underwent a routine treadmill test which was negative for ischemia. She was able to exercise to an 8.6 met workload and achieved a maximal heart rate of 173 beats per minute. There were nonspecific ST-T changes not diagnostic of ischemia.  An echo Doppler study on 06/11/2013 showed an ejection fraction at 60-65%. There was systolic bowing of her mitral valve without definitive prolapse seen on this study. There is trivial mitral regurgitation. She had normal diastolic parameters.  She underwent a sleep study which suggested increased upper airway resistance syndrome (UARS) without definitive sleep apnea. Her AHI was 2.0. However, her  respiratory disturbance index was 13.9. Her lowest oxygen desaturation was 90%. She did have rare periodic limb movements with an index of 1.1 per hour.  I initially saw her she  had laboratory which did reveal increased liver function studies with an ALT of 105 and an AST of 92. Her exercise sedimentation rate was 15. Vitamin D level was normal at 51. Her B12 level was normal. However, she did have macrocytic indices. Because of an index of suspicion for fatty liver, artery further for an abdominal ultrasound. The liver did appear echogenic diffusely and was felt consistent with a fatty liver pattern. No acute findings were demonstrated.  Over the past 6 months, she has now been exercising regularly.  She has lost 2 inches in approximately 4 pounds.  She still notes some weakness particularly with upper arm exercises.  She denies chest pressure.  She is markedly changed her diet and now significantly tries to avoid carbohydrates and sugars.  She is now moved to the [place] area.  Since I last saw her.  She also underwent nerve conduction studies at [facility].  Repeat blood work was done 2 months ago, which did show elevated insulin level at 37.  LFTs were still elevated, but improved with an ALT at 80, down from 105, an AST of 64, improved from 92.  Hemoglobin 14.5, hematocrit 43.2.   Past Medical History  Diagnosis Date  . Pernicious anemia   . Celiac disease   . Peripheral neuropathy   . Ocular rosacea   . MVP (mitral valve prolapse)   .  Heart murmur     Past Surgical History  Procedure Laterality Date  . Ankle surgery    . Knee surgery      bilateral  . Cesarean section    . Incision and drainage      bartholin cyst  . Tonsillectomy      Allergies  Allergen Reactions  . Almond Oil     almonds  . Eggs Or Egg-Derived Products   . Erythromycin   . Gentamicin   . Indomethacin   . Pyridium [Phenazopyridine Hcl]   . Soybean-Containing Drug Products   . Wheat Bran      Current Outpatient Prescriptions  Medication Sig Dispense Refill  . cephALEXin (KEFLEX) 500 MG capsule Take 500 mg by mouth. Take 1 hour dental work      . Cholecalciferol (VITAMIN D PO) Take 5,000 Int'l Units by mouth daily.      . clotrimazole (MYCELEX) 10 MG troche Take 10 mg by mouth as needed.      . Cyanocobalamin (VITAMIN B 12 PO) Take by mouth daily.      . metroNIDAZOLE (METROGEL) 1 % gel Apply 1 application topically at bedtime.       No current facility-administered medications for this visit.    Social history is notable in that she is married for 31 years. She lives at home with her spouse and son. There is no history of any tobacco or alcohol use. 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 She does walk but recently has not been able to do this secondary to plantar fasciitis.  Family History  Problem Relation Age of Onset  . Osteoporosis Mother   . CVA Mother   . Cancer Father     pancreatic  . Breast cancer Paternal Aunt    ROS General: Negative; No fevers, chills, or night sweats; notable for previous 80 pounds weight gain.  She has lost 4 pounds and recently, 2 inches. HEENT: Positive for ocular rosacea and dry.  Eyes; No changes in hearing, sinus congestion, difficulty swallowing Pulmonary: Negative; No cough, wheezing, shortness of breath, hemoptysis Cardiovascular: Negative; No chest pain, presyncope, syncope, palpaiations GI: Negative; No nausea, vomiting, diarrhea, or abdominal pain GU: Negative; No dysuria, hematuria, or difficulty voiding Musculoskeletal: History of plantar fasciitis.; no myalgias, joint pain, or weakness Hematologic/Oncology: Positive for pernicious anemia; no easy bruising, bleeding Endocrine: Negative; no heat/cold intolerance; no diabetes History of vitamin D insufficiency Neuro: Negative; no changes in balance, headaches Skin: Negative; No rashes or skin  lesions Psychiatric: Negative; No behavioral problems, depression Sleep: Positive for increased upper airway resistance syndrome;  snoring, mild bruxism; no daytime sleepiness, hypersomnolence,  restless legs, hypnogognic hallucinations, no cataplexy Other comprehensive 14 point system review is negative.   PE BP 130/84  Pulse 79  Ht 5' 3.25" (1.607 m)  Wt 225 lb 8 oz (102.286 kg)  BMI 39.61 kg/m2 General: Alert, oriented, no distress. Morbidly obese by BMI standards. Skin: normal turgor, no rashes HEENT: Normocephalic, atraumatic. Pupils round and reactive; sclera anicteric; Fundi without hemorrhages or exudate Nose without nasal septal hypertrophy Mouth/Parynx benign; Mallinpatti scale 3/4 Neck: No JVD, no carotid bruits; normal carotid upstroke Lungs: clear to ausculatation and percussion; no wheezing or rales Chest wall: without tenderness to palpitation Heart: RRR, s1 s2 normal 1/6 systolic murmur. No definitive click. Abdomen: Moderate obesity. soft, nontender; no hepatosplenomehaly, BS+; abdominal aorta nontender and not dilated by palpation. Back: no CVA  tenderness Pulses 2+ Extremities: no clubbinbg cyanosis or edema, Homan's sign negative  Neurologic: grossly nonfocal; Cranial nerves grossly wnl Psychologic: Normal mood and affect  ECG (independently read by me): Normal sinus rhythm at 79 beats per minute.  No ectopy.  ECG from 06/06/2013 (independently read by me): Sinus rhythm at 88 beats per minute several isolated unifocal PVCs with retrograde P wave after the PVC. QTc interval 442 Helen  LABS:  BMET    Component Value Date/Time   NA 139 10/11/2013 0819   K 4.7 10/11/2013 0819   CL 105 10/11/2013 0819   CO2 27 10/11/2013 0819   GLUCOSE 87 10/11/2013 0819   BUN 16 10/11/2013 0819   CREATININE 0.65 10/11/2013 0819   CALCIUM 9.0 10/11/2013 0819     Hepatic Function Panel     Component Value Date/Time   PROT 6.5 10/11/2013 0819   ALBUMIN 3.7 10/11/2013 0819   AST 64*  10/11/2013 0819   ALT 80* 10/11/2013 0819   ALKPHOS 72 10/11/2013 0819   BILITOT 0.6 10/11/2013 0819     CBC    Component Value Date/Time   WBC 7.7 10/11/2013 0819   RBC 4.33 10/11/2013 0819   HGB 14.5 10/11/2013 0819   HCT 43.2 10/11/2013 0819   PLT 219 10/11/2013 0819   MCV 99.8 10/11/2013 0819   MCH 33.5 10/11/2013 0819   MCHC 33.6 10/11/2013 0819   RDW 13.7 10/11/2013 0819   LYMPHSABS 3.1 05/25/2013 0802   MONOABS 0.7 05/25/2013 0802   EOSABS 0.2 05/25/2013 0802   BASOSABS 0.0 05/25/2013 0802     BNP No results found for this basename: probnp    Lipid Panel     Component Value Date/Time   CHOL 155 10/11/2013 0819   TRIG 91 10/11/2013 0819   HDL 42 10/11/2013 0819   CHOLHDL 3.7 10/11/2013 0819   VLDL 18 10/11/2013 0819   LDLCALC 95 10/11/2013 0819     RADIOLOGY: No results found.   ASSESSMENT AND PLAN: Helen Jackson is a 53 year old morbidly obese female who has a diagnosis of mitral valve prolapse for approximately 25 years, a history of pernicious anemia, who as gained approximately 80 pounds over the last 10 year period.  A routine treadmill test was negative for ischemia.  Echo Doppler study showed normal systolic function.  She does have increased upper resistance syndrome without definitive sleep apnea on her diagnostic polysomnogram.  Since I last saw her, she has made a significant contribution in change of diet.  She also has been exercising.  She has lost 2 inches in approximately 4 pounds.  I have recommended additional size, particularly with reference to abdominal core strength.  She did have increased LFTs noted in the past, reflective of hepato-stearrhea contributed by her obesity.  I am recommending followup laboratory be obtained with a conference of metabolic panel for reassessment.  Now that she has lost weight and has increased exercise.  She now is [place] area.  I suggested a referral to [facility] to establish primary care.  We discussed the need for  continued weight loss.  I will contact her regarding her repeat blood work.  I will see her in 6 months for reevaluation.  Troy Sine, MD, [facility] 01/02/2014 10:35 AM

## 2014-01-09 ENCOUNTER — Telehealth: Payer: Self-pay | Admitting: Cardiovascular Disease

## 2014-01-09 NOTE — Telephone Encounter (Signed)
Pt says she just had a sleep study in [place] she still need another one? She said it was on her to do list.

## 2014-01-09 NOTE — Telephone Encounter (Signed)
Returned a call to patient informed her that Dr. Evette Georges 01/02/14 office note does not mention anything about having a repeat sleep study. She can cross it off of her list for now unless he recommends this at a later date.

## 2014-02-07 ENCOUNTER — Telehealth: Payer: Self-pay | Admitting: Certified Nurse Midwife

## 2014-02-07 NOTE — Telephone Encounter (Signed)
Pt rescheduled her appointment for wed at 1:00 giving less than 24 hour notice. She is aware that she will be charged a fee and she said "whatever because something came up at work".

## 2014-02-08 ENCOUNTER — Ambulatory Visit: Payer: Self-pay | Admitting: Certified Nurse Midwife

## 2014-02-21 ENCOUNTER — Ambulatory Visit (INDEPENDENT_AMBULATORY_CARE_PROVIDER_SITE_OTHER): Payer: BC Managed Care – PPO | Admitting: Certified Nurse Midwife

## 2014-02-21 ENCOUNTER — Encounter: Payer: Self-pay | Admitting: Certified Nurse Midwife

## 2014-02-21 VITALS — BP 110/70 | HR 72 | Resp 16 | Ht 63.25 in | Wt 226.0 lb

## 2014-02-21 DIAGNOSIS — Z124 Encounter for screening for malignant neoplasm of cervix: Secondary | ICD-10-CM

## 2014-02-21 DIAGNOSIS — Z01419 Encounter for gynecological examination (general) (routine) without abnormal findings: Secondary | ICD-10-CM

## 2014-02-21 DIAGNOSIS — B3731 Acute candidiasis of vulva and vagina: Secondary | ICD-10-CM

## 2014-02-21 DIAGNOSIS — B373 Candidiasis of vulva and vagina: Secondary | ICD-10-CM

## 2014-02-21 DIAGNOSIS — Z0000 Encounter for general adult medical examination without abnormal findings: Secondary | ICD-10-CM

## 2014-02-21 LAB — POCT URINALYSIS DIPSTICK
Bilirubin, UA: NEGATIVE
Blood, UA: NEGATIVE
Glucose, UA: NEGATIVE
Ketones, UA: NEGATIVE
Leukocytes, UA: NEGATIVE
Nitrite, UA: NEGATIVE
Protein, UA: NEGATIVE
Urobilinogen, UA: NEGATIVE
pH, UA: 5

## 2014-02-21 MED ORDER — FLUCONAZOLE 150 MG PO TABS: ORAL_TABLET | ORAL | Status: DC

## 2014-02-21 NOTE — Patient Instructions (Signed)

## 2014-02-21 NOTE — Progress Notes (Signed)
53 y.o. G29P1001 Married Caucasian Fe here for annual exam. Menopausal no HRT. Denies vaginal bleeding or vaginal dryness. Patient seeing [facility] for aex and Endocrine for insulin resistance. Being followed for fatty liver and being seen at [facility] for Rosacea of her eyes. No new problems this year. Complaining today of vaginal itching with slight increase discharge. No new personal products. Plans colonoscopy this year with Dr. Collene Mares. No other health issues today.   Patient's last menstrual period was 07/17/2012.          Sexually active: Yes.    The current method of family planning is post menopausal status.    Exercising: Yes.    exercise Smoker:  no  Health Maintenance: Pap:  02-18-12 neg HPV HR neg MMG:  03-18-13 composition category a, birads category 1:neg Colonoscopy:  1980"s plans to schedule this year with PCP BMD:   2015 TDaP:  Unsure per patient, UTD with PCP Labs: Poct urine-neg Self breast exam: not done   reports that she has never smoked. She has never used smokeless tobacco. She reports that she does not drink alcohol or use illicit drugs.  Past Medical History  Diagnosis Date  . Pernicious anemia   . Celiac disease   . Peripheral neuropathy   . Ocular rosacea   . MVP (mitral valve prolapse)   . Heart murmur   . Fibroid     Past Surgical History  Procedure Laterality Date  . Ankle surgery    . Knee surgery      bilateral  . Cesarean section    . Incision and drainage      bartholin cyst  . Tonsillectomy      Current Outpatient Prescriptions  Medication Sig Dispense Refill  . cephALEXin (KEFLEX) 500 MG capsule Take 500 mg by mouth. Take 1 hour dental work      . Cholecalciferol (VITAMIN D PO) Take 5,000 Int'l Units by mouth daily.      . Cyanocobalamin (VITAMIN B 12 PO) Take by mouth daily.      . metroNIDAZOLE (METROGEL) 1 % gel Apply 1 application topically at bedtime.      Marland Kitchen EPIPEN 2-PAK 0.3 MG/0.3ML SOAJ injection as needed.       No current  facility-administered medications for this visit.    Family History  Problem Relation Age of Onset  . Osteoporosis Mother   . CVA Mother   . Cancer Father     pancreatic  . Breast cancer Paternal Aunt     ROS:  Pertinent items are noted in HPI.  Otherwise, a comprehensive ROS was negative.  Exam:   BP 110/70  Pulse 72  Resp 16  Ht 5' 3.25" (1.607 m)  Wt 226 lb (102.513 kg)  BMI 39.70 kg/m2  LMP 07/17/2012 Height: 5' 3.25" (160.7 cm)  Ht Readings from Last 3 Encounters:  02/21/14 5' 3.25" (1.607 m)  01/02/14 5' 3.25" (1.607 m)  07/13/13 5' 3.25" (1.607 m)    General appearance: alert, cooperative and appears stated age Head: Normocephalic, without obvious abnormality, atraumatic Neck: no adenopathy, supple, symmetrical, trachea midline and thyroid normal to inspection and palpation Lungs: clear to auscultation bilaterally Breasts: normal appearance, no masses or tenderness, No nipple retraction or dimpling, No nipple discharge or bleeding, No axillary or supraclavicular adenopathy Heart: regular rate and rhythm Abdomen: soft, non-tender; no masses,  no organomegaly Extremities: extremities normal, atraumatic, no cyanosis or edema Skin: Skin color, texture, turgor normal. No rashes or lesions Lymph nodes: Cervical,  supraclavicular, and axillary nodes normal. No abnormal inguinal nodes palpated Neurologic: Grossly normal   Pelvic: External genitalia:  no lesions, slight increase redness, no exudate wet prep taken              Urethra:  normal appearing urethra with no masses, tenderness or lesions              Bartholin's and Skene's: normal                 Vagina: normal appearing vagina with normal color and white thick discharge, no lesions ph. 4.0 wet prep taken              Cervix: normal, non tender, no lesions              Pap taken: Yes.   Bimanual Exam:  Uterus:  normal size, contour, position, consistency, mobility, non-tender and retroverted               Adnexa: normal adnexa and no mass, fullness, tenderness               Rectovaginal: Confirms               Anus:  normal sphincter tone, no lesions  A:  Well Woman with normal exam  Menopausal no HRT  Yeast vaginitis/vulvitis  History of fatty liver under follow up  Endocrine issues under MD management   Ocular Rosacea with [facility] management     P:   Reviewed health and wellness pertinent to exam  Aware of need to evaluate if occurs  Reviewed findings and etiology. Try to change underwear frequently if becoming moist to help with prevention.  Rx Diflucan see order  Continue follow up with other medical problems as indicated.  Pap smear taken today with HPV reflex   counseled on breast self exam, mammography screening, menopause, adequate intake of calcium and vitamin D, diet and exercise  return annually or prn  An After Visit Summary was printed and given to the patient.

## 2014-02-24 LAB — COMPREHENSIVE METABOLIC PANEL
ALT: 80 U/L — ABNORMAL HIGH (ref 0–35)
AST: 66 U/L — ABNORMAL HIGH (ref 0–37)
Albumin: 4 g/dL (ref 3.5–5.2)
Alkaline Phosphatase: 84 U/L (ref 39–117)
BUN: 14 mg/dL (ref 6–23)
CO2: 28 mEq/L (ref 19–32)
Calcium: 9.1 mg/dL (ref 8.4–10.5)
Chloride: 106 mEq/L (ref 96–112)
Creat: 0.67 mg/dL (ref 0.50–1.10)
Glucose, Bld: 88 mg/dL (ref 70–99)
Potassium: 4.8 mEq/L (ref 3.5–5.3)
Sodium: 141 mEq/L (ref 135–145)
Total Bilirubin: 0.5 mg/dL (ref 0.2–1.2)
Total Protein: 6.8 g/dL (ref 6.0–8.3)

## 2014-02-27 LAB — IPS PAP TEST WITH REFLEX TO HPV

## 2014-02-28 ENCOUNTER — Telehealth: Payer: Self-pay | Admitting: *Deleted

## 2014-02-28 NOTE — Telephone Encounter (Signed)
Message copied by Lauralee Evener on Tue Feb 28, 2014  2:43 PM ------      Message from: [name] A      Created: Tue Feb 28, 2014 12:26 PM       LFT's remain elvated; no change from 4 mo but better from previously. Not on any meds to cause. ? Fatty liver; consider [place] of liver to asses. ------

## 2014-02-28 NOTE — Telephone Encounter (Signed)
OK. No need to repeat US. Continue with weight loss and strict diet.

## 2014-02-28 NOTE — Telephone Encounter (Signed)
Called to give patient lab results and recommendation's. She informed me that she had a  abdominal ultrasound done in January that did show fatty liver. She states that she has appointment scheduled to see Dr. Meriel Pica in October. Recommended that she keep that appointment and notify Dr. Collene Mares of Dr. Evette Georges findings. Patient agreed with plan and will discuss with Dr. Collene Mares at her October appointment.

## 2014-03-05 NOTE — Progress Notes (Signed)
Reviewed personally.  M. Suzanne Denisse Whitenack, MD.  

## 2014-04-17 ENCOUNTER — Encounter: Payer: Self-pay | Admitting: Certified Nurse Midwife

## 2014-07-03 ENCOUNTER — Encounter (HOSPITAL_COMMUNITY): Payer: Self-pay | Admitting: *Deleted

## 2014-07-12 ENCOUNTER — Other Ambulatory Visit: Payer: Self-pay | Admitting: Gastroenterology

## 2014-07-13 ENCOUNTER — Ambulatory Visit (HOSPITAL_COMMUNITY)
Admission: RE | Admit: 2014-07-13 | Payer: BLUE CROSS/BLUE SHIELD | Source: Ambulatory Visit | Admitting: Gastroenterology

## 2014-07-13 HISTORY — DX: Disease of upper respiratory tract, unspecified: J39.9

## 2014-07-13 HISTORY — DX: Unspecified osteoarthritis, unspecified site: M19.90

## 2014-07-13 SURGERY — COLONOSCOPY WITH PROPOFOL
Anesthesia: Monitor Anesthesia Care

## 2014-09-15 ENCOUNTER — Ambulatory Visit: Payer: Self-pay | Admitting: Cardiovascular Disease

## 2014-10-16 ENCOUNTER — Ambulatory Visit: Payer: Self-pay | Admitting: Cardiovascular Disease

## 2014-11-20 ENCOUNTER — Encounter (HOSPITAL_COMMUNITY): Payer: Self-pay | Admitting: *Deleted

## 2014-11-27 ENCOUNTER — Other Ambulatory Visit: Payer: Self-pay | Admitting: Gastroenterology

## 2014-11-27 NOTE — Anesthesia Preprocedure Evaluation (Addendum)
Anesthesia Evaluation  Patient identified by MRN, date of birth, ID band Patient awake    Reviewed: Allergy & Precautions, H&P , NPO status , Patient's Chart, lab work & pertinent test results  Airway Mallampati: II  TM Distance: >3 FB Neck ROM: full    Dental no notable dental hx. 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 surgery entering recovery and in recovery negative psych ROS   GI/Hepatic negative GI ROS, Neg liver ROS,   Endo/Other  negative endocrine ROSMorbid obesity  Renal/GU negative Renal ROS  negative genitourinary   Musculoskeletal   Abdominal (+) + obese,   Peds  Hematology negative hematology ROS (+) anemia , pernicous anemia   Anesthesia Other Findings   Reproductive/Obstetrics negative OB ROS                           Anesthesia Physical Anesthesia Plan  ASA: III  Anesthesia Plan: MAC   Post-op Pain Management:    Induction:   Airway Management Planned:   Additional Equipment:   Intra-op Plan:   Post-operative Plan:   Informed Consent: I have reviewed the patients History and Physical, chart, labs and discussed the procedure including the risks, benefits and alternatives for the proposed anesthesia with the patient or authorized representative who has indicated his/her understanding and acceptance.   Dental Advisory Given  Plan Discussed with: CRNA and Surgeon  Anesthesia Plan Comments:         Anesthesia Quick Evaluation

## 2014-11-28 ENCOUNTER — Encounter (HOSPITAL_COMMUNITY): Payer: Self-pay

## 2014-11-28 ENCOUNTER — Ambulatory Visit (HOSPITAL_COMMUNITY)
Admission: RE | Admit: 2014-11-28 | Discharge: 2014-11-28 | Disposition: A | Discharge status: Home / Self Care | Payer: BLUE CROSS/BLUE SHIELD | Source: Ambulatory Visit | Attending: Gastroenterology | Admitting: Gastroenterology

## 2014-11-28 ENCOUNTER — Encounter (HOSPITAL_COMMUNITY)
Admission: RE | Disposition: A | Discharge status: Home / Self Care | Payer: Self-pay | Source: Ambulatory Visit | Attending: Gastroenterology

## 2014-11-28 ENCOUNTER — Ambulatory Visit (HOSPITAL_COMMUNITY): Payer: BLUE CROSS/BLUE SHIELD | Admitting: Anesthesiology

## 2014-11-28 DIAGNOSIS — Z91018 Allergy to other foods: Secondary | ICD-10-CM | POA: Diagnosis not present

## 2014-11-28 DIAGNOSIS — I341 Nonrheumatic mitral (valve) prolapse: Secondary | ICD-10-CM | POA: Diagnosis not present

## 2014-11-28 DIAGNOSIS — Z887 Allergy status to serum and vaccine status: Secondary | ICD-10-CM | POA: Insufficient documentation

## 2014-11-28 DIAGNOSIS — Z6836 Body mass index (BMI) 36.0-36.9, adult: Secondary | ICD-10-CM | POA: Insufficient documentation

## 2014-11-28 DIAGNOSIS — Z79899 Other long term (current) drug therapy: Secondary | ICD-10-CM | POA: Diagnosis not present

## 2014-11-28 DIAGNOSIS — M1990 Unspecified osteoarthritis, unspecified site: Secondary | ICD-10-CM | POA: Insufficient documentation

## 2014-11-28 DIAGNOSIS — R569 Unspecified convulsions: Secondary | ICD-10-CM | POA: Diagnosis not present

## 2014-11-28 DIAGNOSIS — L718 Other rosacea: Secondary | ICD-10-CM | POA: Insufficient documentation

## 2014-11-28 DIAGNOSIS — G4730 Sleep apnea, unspecified: Secondary | ICD-10-CM | POA: Diagnosis not present

## 2014-11-28 DIAGNOSIS — G629 Polyneuropathy, unspecified: Secondary | ICD-10-CM | POA: Insufficient documentation

## 2014-11-28 DIAGNOSIS — Z91012 Allergy to eggs: Secondary | ICD-10-CM | POA: Insufficient documentation

## 2014-11-28 DIAGNOSIS — R1310 Dysphagia, unspecified: Secondary | ICD-10-CM | POA: Insufficient documentation

## 2014-11-28 DIAGNOSIS — Z1211 Encounter for screening for malignant neoplasm of colon: Secondary | ICD-10-CM | POA: Insufficient documentation

## 2014-11-28 DIAGNOSIS — K900 Celiac disease: Secondary | ICD-10-CM | POA: Diagnosis not present

## 2014-11-28 DIAGNOSIS — Z881 Allergy status to other antibiotic agents status: Secondary | ICD-10-CM | POA: Diagnosis not present

## 2014-11-28 DIAGNOSIS — D510 Vitamin B12 deficiency anemia due to intrinsic factor deficiency: Secondary | ICD-10-CM | POA: Insufficient documentation

## 2014-11-28 HISTORY — PX: ESOPHAGOGASTRODUODENOSCOPY (EGD) WITH PROPOFOL: SHX5813

## 2014-11-28 HISTORY — DX: Other complications of anesthesia, initial encounter: T88.59XA

## 2014-11-28 HISTORY — DX: Adverse effect of unspecified anesthetic, initial encounter: T41.45XA

## 2014-11-28 HISTORY — DX: Unspecified convulsions: R56.9

## 2014-11-28 HISTORY — PX: COLONOSCOPY WITH PROPOFOL: SHX5780

## 2014-11-28 SURGERY — ESOPHAGOGASTRODUODENOSCOPY (EGD) WITH PROPOFOL
Anesthesia: Monitor Anesthesia Care

## 2014-11-28 MED ORDER — FENTANYL CITRATE (PF) 100 MCG/2ML IJ SOLN: 25.0000 ug | INTRAMUSCULAR | Status: DC | PRN

## 2014-11-28 MED ORDER — ONDANSETRON HCL 4 MG/2ML IJ SOLN
INTRAMUSCULAR | Status: DC | PRN
  Administered 2014-11-28: 4 mg via INTRAVENOUS

## 2014-11-28 MED ORDER — PROPOFOL 10 MG/ML IV BOLUS
INTRAVENOUS | Status: AC
  Filled 2014-11-28: qty 20

## 2014-11-28 MED ORDER — LACTATED RINGERS IV SOLN
INTRAVENOUS | Status: DC
  Administered 2014-11-28: 1000 mL via INTRAVENOUS

## 2014-11-28 MED ORDER — PROPOFOL 10 MG/ML IV BOLUS
INTRAVENOUS | Status: DC | PRN
  Administered 2014-11-28 (×2): 20 mg via INTRAVENOUS

## 2014-11-28 MED ORDER — EPHEDRINE SULFATE 50 MG/ML IJ SOLN
INTRAMUSCULAR | Status: AC
  Filled 2014-11-28: qty 1

## 2014-11-28 MED ORDER — SODIUM CHLORIDE 0.9 % IV SOLN: INTRAVENOUS | Status: DC

## 2014-11-28 MED ORDER — ONDANSETRON HCL 4 MG/2ML IJ SOLN
INTRAMUSCULAR | Status: AC
  Filled 2014-11-28: qty 2

## 2014-11-28 MED ORDER — LIDOCAINE HCL (CARDIAC) 20 MG/ML IV SOLN
INTRAVENOUS | Status: AC
  Filled 2014-11-28: qty 5

## 2014-11-28 MED ORDER — LACTATED RINGERS IV SOLN
INTRAVENOUS | Status: DC
  Administered 2014-11-28: 07:00:00 via INTRAVENOUS

## 2014-11-28 MED ORDER — LIDOCAINE HCL (CARDIAC) 20 MG/ML IV SOLN
INTRAVENOUS | Status: DC | PRN
  Administered 2014-11-28: 80 mg via INTRAVENOUS

## 2014-11-28 MED ORDER — SODIUM CHLORIDE 0.9 % IJ SOLN
INTRAMUSCULAR | Status: AC
  Filled 2014-11-28: qty 10

## 2014-11-28 MED ORDER — PROPOFOL INFUSION 10 MG/ML OPTIME
INTRAVENOUS | Status: DC | PRN
  Administered 2014-11-28: 120 ug/kg/min via INTRAVENOUS

## 2014-11-28 SURGICAL SUPPLY — 24 items

## 2014-11-28 NOTE — Op Note (Signed)
[facility] [place], [id number]   OPERATIVE PROCEDURE REPORT  PATIENT :Helen Jackson, Helen Jackson  MR#: [id number] BIRTHDATE :1961-03-14 GENDER: female ENDOSCOPIST: Edmonia James, MD ASSISTANT:   Sharon Mt, technician & Hilma Favors, RN PROCEDURE DATE: 12-09-14 PRE-PROCEDURE PREPERATION: Patient fasted for 4 hours prior to procedure. PRE-PROCEDURE PHYSICAL: Patient has stable vital signs.  Neck is supple.  There is no JVD, thyromegaly or LAD.  Chest clear to auscultation.  S1 and S2 regular.  Abdomen soft, non-distended, non-tender with NABS. PROCEDURE:     EGD with esophageal  biopsies. ASA CLASS:     Class III INDICATIONS:     Dysphagia. MEDICATIONS:     Monitored anesthesia care TOPICAL ANESTHETIC:   none given.  DESCRIPTION OF PROCEDURE: After the risks benefits and alternatives of the procedure were thoroughly explained, informed consent was obtained.  The Pentax Gastroscope [id number]  was introduced through the mouth and advanced to the second portion of the duodenum , without limitations. The instrument was slowly withdrawn as the mucosa was fully examined. Estimated blood loss is zero unless otherwise noted in this procedure report.      EXAM: The esophagus and gastroesophageal junction were completely normal in appearance.  The stomach was entered and closely examined.The antrum, angularis, and lesser curvature were well visualized, including a retroflexed view of the cardia and fundus. The stomach wall was normally distensable.  The scope passed easily through the pylorus into the duodenum. Biopsies were done from the esophagus to rule out Eosinophilic Esophagitis. There were no ulcers, erosions, masses or polyps noted. Retroflexed views revealed no abnormalities. The scope was then withdrawn from the patient and the procedure terminated. The patient tolerated the procedure without immediate complications.  IMPRESSION:  Normal  appearing esophagus and GE junction, the stomach was well visualized and normal in appearance, normal appearing duodenum RECOMMENDATIONS:     1.  Await pathology results. 2.  Anti-reflux regimen to be followed. 3.  Continue current medications. 4.  Avoid NSAIDS for two weeks. 5.  OP follow-up in 2 weeks.  REPEAT EXAM:  no repeat exam necessary; no recall planned  DISCHARGE INSTRUCTIONS: standard discharge instructions given _______________________________ eSignedEdmonia James, MD December 09, 2014 7:57 AM   CPT CODES:     [phone] Upper gastrointestinal endoscopy including esophagus, stomach, and either the duodenum and/or jejunum as appropriate; with biopsy, single or multiple DIAGNOSIS CODES:     R13.10 Dysphagia,unspecified  The ICD and CPT codes recommended by this software are interpretations from the data that the clinical staff has captured with the software.  The verification of the translation of this report to the ICD and CPT codes and modifiers is the sole responsibility of the health care institution and practicing physician where this report was generated.  [employer]. will not be held responsible for the validity of the ICD and CPT codes included on this report.  AMA assumes no liability for data contained or not contained herein. CPT is a Designer, television/film set of the [employer].   CC: Reatha Harps, NP CC  [name], MD .  PATIENT NAME:  Helen Jackson, Helen Jackson MR#: [id number]

## 2014-11-28 NOTE — Transfer of Care (Signed)
Immediate Anesthesia Transfer of Care Note  Patient: Helen Jackson  Procedure(s) Performed: Procedure(s): ESOPHAGOGASTRODUODENOSCOPY (EGD) WITH PROPOFOL (N/A) COLONOSCOPY WITH PROPOFOL (N/A)  Patient Location: endoscopy  Anesthesia Type:MAC  Level of Consciousness:  awake, patient cooperative and responds to stimulation  Airway & Oxygen Therapy:Patient Spontanous Breathing and Patient connected to face mask oxgen  Post-op Assessment:  Report given to PACU RN and Post -op Vital signs reviewed and stable  Post vital signs:  Reviewed and stable  Last Vitals:  Filed Vitals:   11/28/14 0638  BP: 152/80  Pulse: 79  Temp: 37.1 C  Resp: 14    Complications: No apparent anesthesia complications

## 2014-11-28 NOTE — Anesthesia Postprocedure Evaluation (Signed)
  Anesthesia Post-op Note  Patient: Helen Jackson  Procedure(s) Performed: Procedure(s) (LRB): ESOPHAGOGASTRODUODENOSCOPY (EGD) WITH PROPOFOL (N/A) COLONOSCOPY WITH PROPOFOL (N/A)  Patient Location: PACU  Anesthesia Type: MAC  Level of Consciousness: awake and alert   Airway and Oxygen Therapy: Patient Spontanous Breathing  Post-op Pain: mild  Post-op Assessment: Post-op Vital signs reviewed, Patient's Cardiovascular Status Stable, Respiratory Function Stable, Patent Airway and No signs of Nausea or vomiting  Last Vitals:  Filed Vitals:   11/28/14 0830  BP:   Pulse: 66  Temp:   Resp: 13    Post-op Vital Signs: stable   Complications: No apparent anesthesia complications

## 2014-11-28 NOTE — Op Note (Signed)
[facility] [place], [address]   OPERATIVE PROCEDURE REPORT  PATIENT: Helen Jackson, Helen Jackson  MR#: [id number] BIRTHDATE: 1961/01/06 GENDER: female ENDOSCOPIST: Edmonia James, MD ASSISTANT:   Sharon Mt, technician & Hilma Favors, RN. PROCEDURE DATE: Dec 24, 2014 PRE-PROCEDURE PREPARATION: Patient fasted for 4 hours prior to procedure. The patient was prepped with a gallon of Golytely the night prior to the procedure.  The patient has fasted for 4 hours prior to the procedure PRE-PROCEDURE PHYSICAL: Patient has stable vital signs.  Neck is supple.  There is no JVD, thyromegaly or LAD.  Chest clear to auscultation.  S1 and S2 regular.  Abdomen soft, non-distended, non-tender with NABS. PROCEDURE:     Colonoscopy, diagnostic ASA CLASS:     Class III INDICATIONS:     1.  average risk patient for colon cancer. MEDICATIONS:     Monitored anesthesia care  DESCRIPTION OF PROCEDURE:   After the risks, benefits, and alternatives of the procedure were thoroughly explained [including a 10% missed rate of cancer and polyps], informed consent was obtained.  Digital rectal exam was performed.  The Pentax Ped Colon [id number]  was introduced through the anus  and advanced to the cecum, which was identified by both the appendix and ileocecal valve , limited by No adverse events experienced.   The quality of the prep was fair. . Multiple washes were done. Small lesions could be missed. The instrument was then slowly withdrawn as the colon was fully examined. Estimated blood loss is zero unless otherwise noted in this procedure report.     COLON FINDINGS: A normal appearing cecum, ileocecal valve, and appendiceal orifice were identified.  The ascending, transverse, descending, sigmoid colon, and rectum appeared unremarkable.  The entire colonic mucosa appeared healthy with a normal vascular pattern.  No masses, polyps, diverticula or AVMs were noted.  The appendiceal orifice and the ICV were identified and photographed. Retroflexed views revealed no abnormalities. The patient tolerated the procedure without immediate complications.  The scope was then withdrawn from the patient and the procedure terminated. There was a lot of residual stool in the colon; multiple washes were done; small polyups/lesions could be missed.  TIME TO CECUM:   5 minutes 00 seconds WITHDRAW TIME:  6 minutes 00 seconds  IMPRESSION:     Normal colonoscopy upto the cecum; there was a lot of residual stool in the colon; small lesions could be missed.   RECOMMENDATIONS:     1.  Continue current medications 2.  Continue surveillance 3.  High fiber diet with liberal fluid intake. 4.  Out patient follow-up in 2 weeks.  REPEAT EXAM:      In 5 years  for a repeat colonoscopy with a 5 day prep.  If the patient has any abnormal GI symptoms in the interim, she/he have been advised to contact the office as soon as possible for further recommendations.   REFERRED PP:IRJJO Hollice Espy, NP [name], MD eSigned:  Edmonia James, MD Dec 24, 2014 8:06 AM  CPT CODES:     [id number] Colonoscopy, flexible, proximal to splenic flexure; diagnostic, with or without collection of specimen(s) by brushing or washing, with or without colon decompression (separate procedure) ICD CODES:     Z12.11 Encounter for screening for malignant neoplasm of colon  The ICD and CPT codes recommended by this software are interpretations from the data that the clinical staff has captured with the software.  The verification of the translation of this report to the ICD and  CPT codes and modifiers is the sole responsibility of the health care institution and practicing physician where this report was generated.  [employer] will not be held responsible for the validity of the ICD and CPT codes included on this report.  AMA assumes no liability for data contained or not contained  herein. CPT is a Designer, television/film set of the [employer].  PATIENT NAME:  Helen Jackson, Helen Jackson MR#: [id number]

## 2014-11-28 NOTE — H&P (Signed)
Helen Jackson is an 54 y.o. female.    Chief Complaint: Difficulty swallowing/Colorectal cancer screening.  HPI: 54 year old white female with a history of dysphagia, here for an EGD/Colonoscopy. See office notes for details.   Past Medical History  Diagnosis Date  . Celiac disease   . Peripheral neuropathy   . Ocular rosacea   . MVP (mitral valve prolapse)   . Fibroid   . Arthritis   . Upper respiratory disease     upper respiratory airway disease  . Seizures     see notes in anesthesia  . Heart murmur     Dr. Kelly,cardiolgy follows  . Pernicious anemia     injections every 4 to 6 weeks -last injestion 09-15-14  . Complication of anesthesia     Slow to awaken. X2 past episodes  of seizures s/p anesthesia following surgery- entering recovery and once in recovery   Past Surgical History  Procedure Laterality Date  . Ankle surgery    . Knee surgery      bilateral  . Cesarean section    . Incision and drainage      bartholin cyst  . Tonsillectomy     Family History  Problem Relation Age of Onset  . Osteoporosis Mother   . CVA Mother   . Cancer Father     pancreatic  . Breast cancer Paternal Aunt    Social History:  reports that she has never smoked. She has never used smokeless tobacco. She reports that she does not drink alcohol or use illicit drugs.  Allergies:  Allergies  Allergen Reactions  . Gentamicin Anaphylaxis  . Influenza Vaccines Anaphylaxis  . Other Anaphylaxis    Almond-  . Almond Oil     almonds  . Amoxicillin Hives  . Doxycycline     Cant remember  . Eggs Or Egg-Derived Products Nausea And Vomiting    11-20-14 Pt. States "had anaphylaxis with flu vaccine shot with egg based"  . Erythromycin Hives  . Indomethacin Other (See Comments)    Stomach pain  . Pyridium [Phenazopyridine Hcl]     Cant remember  . Soybean-Containing Drug Products Other (See Comments)    Swelling of the tongue  . Tetracyclines & Related Hives    swelling  . Tramadol  Itching    severe  . Wheat Bran     Cant remember   Medications Prior to Admission  Medication Sig Dispense Refill  . acetaminophen (TYLENOL) 500 MG tablet Take 500 mg by mouth every 6 (six) hours as needed for moderate pain or headache.    . Cholecalciferol (VITAMIN D PO) Take 5,000 Units by mouth every morning.     . metroNIDAZOLE (METROGEL) 1 % gel Apply 1 application topically at bedtime.    . Multiple Vitamin (MULTIVITAMIN WITH MINERALS) TABS tablet Take 1 tablet by mouth every morning.    Marland Kitchen albuterol (PROVENTIL HFA;VENTOLIN HFA) 108 (90 BASE) MCG/ACT inhaler Inhale 1-2 puffs into the lungs every 6 (six) hours as needed for wheezing or shortness of breath.    . B Complex Vitamins (VITAMIN B COMPLEX IJ) Inject as directed every 30 (thirty) days.    . cephALEXin (KEFLEX) 500 MG capsule Take 1,000 mg by mouth. Take 1 hour dental work    . EPIPEN 2-PAK 0.3 MG/0.3ML SOAJ injection Inject 0.3 mg as directed as needed (for allergies/never used).     . fluconazole (DIFLUCAN) 150 MG tablet Take one tablet and. Repeat one in 5 days (Patient  not taking: Reported on 07/05/2014) 2 tablet 0   Review of Systems  Constitutional: Negative.   Eyes: Negative.   Respiratory: Negative.   Cardiovascular: Negative.   Musculoskeletal: Positive for joint pain.  Skin: Negative.   Neurological: Positive for headaches.  Endo/Heme/Allergies: Negative.   Psychiatric/Behavioral: Negative.    Blood pressure 152/80, pulse 79, temperature 98.8 F (37.1 C), temperature source Oral, resp. rate 14, height 5' 3.75" (1.619 m), weight 95.255 kg (210 lb), last menstrual period 07/17/2012, SpO2 100 %. Physical Exam  Constitutional: She is oriented to person, place, and time. She appears well-developed and well-nourished.  HENT:  Head: Normocephalic and atraumatic.  Eyes: Conjunctivae and EOM are normal. Pupils are equal, round, and reactive to light.  Neck: Normal range of motion. Neck supple.  Cardiovascular: Normal  rate and regular rhythm.   GI: Soft. Bowel sounds are normal. She exhibits no distension and no mass. There is no tenderness. There is no rebound and no guarding.  Musculoskeletal: Normal range of motion.  Neurological: She is alert and oriented to person, place, and time.  Skin: Skin is warm and dry.  Psychiatric: She has a normal mood and affect. Her behavior is normal. Judgment and thought content normal.    Assessment/Plan Colorectal cancer screening/Difficulty swallowing; proceed with an EGD/Colonoscopy at this time.   Helen Jackson 11/28/2014, 7:12 AM

## 2014-11-29 ENCOUNTER — Encounter (HOSPITAL_COMMUNITY): Payer: Self-pay | Admitting: Gastroenterology

## 2014-12-04 ENCOUNTER — Ambulatory Visit: Payer: Self-pay | Admitting: Cardiovascular Disease

## 2015-02-09 ENCOUNTER — Emergency Department (HOSPITAL_COMMUNITY): Payer: BLUE CROSS/BLUE SHIELD

## 2015-02-09 ENCOUNTER — Encounter (HOSPITAL_COMMUNITY): Payer: Self-pay | Admitting: *Deleted

## 2015-02-09 ENCOUNTER — Emergency Department (HOSPITAL_COMMUNITY)
Admission: EM | Admit: 2015-02-09 | Discharge: 2015-02-09 | Disposition: A | Discharge status: Home / Self Care | Payer: BLUE CROSS/BLUE SHIELD | Attending: Emergency Medicine | Admitting: Emergency Medicine

## 2015-02-09 DIAGNOSIS — Y998 Other external cause status: Secondary | ICD-10-CM | POA: Diagnosis not present

## 2015-02-09 DIAGNOSIS — W460XXA Contact with hypodermic needle, initial encounter: Secondary | ICD-10-CM | POA: Diagnosis not present

## 2015-02-09 DIAGNOSIS — Z8679 Personal history of other diseases of the circulatory system: Secondary | ICD-10-CM | POA: Insufficient documentation

## 2015-02-09 DIAGNOSIS — Y9389 Activity, other specified: Secondary | ICD-10-CM | POA: Diagnosis not present

## 2015-02-09 DIAGNOSIS — Y9289 Other specified places as the place of occurrence of the external cause: Secondary | ICD-10-CM | POA: Insufficient documentation

## 2015-02-09 DIAGNOSIS — Z79899 Other long term (current) drug therapy: Secondary | ICD-10-CM | POA: Diagnosis not present

## 2015-02-09 DIAGNOSIS — Z8719 Personal history of other diseases of the digestive system: Secondary | ICD-10-CM | POA: Diagnosis not present

## 2015-02-09 DIAGNOSIS — Z8739 Personal history of other diseases of the musculoskeletal system and connective tissue: Secondary | ICD-10-CM | POA: Diagnosis not present

## 2015-02-09 DIAGNOSIS — IMO0002 Reserved for concepts with insufficient information to code with codable children: Secondary | ICD-10-CM

## 2015-02-09 DIAGNOSIS — Z86018 Personal history of other benign neoplasm: Secondary | ICD-10-CM | POA: Insufficient documentation

## 2015-02-09 DIAGNOSIS — S6991XA Unspecified injury of right wrist, hand and finger(s), initial encounter: Secondary | ICD-10-CM | POA: Diagnosis present

## 2015-02-09 DIAGNOSIS — Z880 Allergy status to penicillin: Secondary | ICD-10-CM | POA: Insufficient documentation

## 2015-02-09 DIAGNOSIS — Z8709 Personal history of other diseases of the respiratory system: Secondary | ICD-10-CM | POA: Diagnosis not present

## 2015-02-09 DIAGNOSIS — Z872 Personal history of diseases of the skin and subcutaneous tissue: Secondary | ICD-10-CM | POA: Insufficient documentation

## 2015-02-09 DIAGNOSIS — Z8669 Personal history of other diseases of the nervous system and sense organs: Secondary | ICD-10-CM | POA: Diagnosis not present

## 2015-02-09 DIAGNOSIS — R011 Cardiac murmur, unspecified: Secondary | ICD-10-CM | POA: Diagnosis not present

## 2015-02-09 DIAGNOSIS — Z862 Personal history of diseases of the blood and blood-forming organs and certain disorders involving the immune mechanism: Secondary | ICD-10-CM | POA: Insufficient documentation

## 2015-02-09 MED ORDER — NITROGLYCERIN 2 % TD OINT
0.5000 [in_us] | TOPICAL_OINTMENT | Freq: Four times a day (QID) | TRANSDERMAL | Status: DC
Start: 2015-02-09 — End: 2015-02-10
  Administered 2015-02-09: 0.5 [in_us] via TOPICAL
  Filled 2015-02-09: qty 30

## 2015-02-09 MED ORDER — IBUPROFEN 800 MG PO TABS: 800.0000 mg | ORAL_TABLET | Freq: Three times a day (TID) | ORAL | Status: DC

## 2015-02-09 MED ORDER — SULFAMETHOXAZOLE-TRIMETHOPRIM 800-160 MG PO TABS: 1.0000 | ORAL_TABLET | Freq: Two times a day (BID) | ORAL | Status: AC

## 2015-02-09 NOTE — ED Notes (Addendum)
Received call from [place] at [facility] for an update.  She is going to speak with their toxicologist and let us know where to proceed from this point. The type of pain the patient is describing is not typical.    Per patient, pain is 8/10 and feels like a "fracture".  The pain is now limited to the thumb only. There is a bruise noted around the puncture site.  Thumb is still pale and painful.  She continues to soak her hand in a warm basin of water.

## 2015-02-09 NOTE — Progress Notes (Signed)
Pt confirms pcp as Helen Jackson States he recently ordered her epipens for her

## 2015-02-09 NOTE — ED Provider Notes (Signed)
CSN: [id number]     Arrival date & time 02/09/15  1417 History   First MD Initiated Contact with Patient 02/09/15 1502     Chief Complaint  Patient presents with  . Hand Pain    right thumb - accidental injection with epi pen     (Consider location/radiation/quality/duration/timing/severity/associated sxs/prior Treatment) HPI Comments: The pt reports accidentally sticking her R thumb with the needle of an active Epi Pen - she was trying to get rid of it and accidentally discharged in her thumb - this occurred 1.5 hours prior to evaluation - she has ongoing constant pain in the R thumb with associated pallor of the thumb - pain extends up into the hand.  Patient is a 54 y.o. female presenting with hand pain. The history is provided by the patient and the spouse.  Hand Pain    Past Medical History  Diagnosis Date  . Celiac disease   . Peripheral neuropathy   . Ocular rosacea   . MVP (mitral valve prolapse)   . Fibroid   . Arthritis   . Upper respiratory disease     upper respiratory airway disease  . Seizures     see notes in anesthesia  . Heart murmur     Dr. Kelly,cardiolgy follows  . Pernicious anemia     injections every 4 to 6 weeks -last injestion 09-15-14  . Complication of anesthesia     Slow to awaken. X2 past episodes  of seizures s/p anesthesia following surgery- entering recovery and once in recovery   Past Surgical History  Procedure Laterality Date  . Ankle surgery    . Knee surgery      bilateral  . Cesarean section    . Incision and drainage      bartholin cyst  . Tonsillectomy    . Esophagogastroduodenoscopy (egd) with propofol N/A 11/28/2014    Procedure: ESOPHAGOGASTRODUODENOSCOPY (EGD) WITH PROPOFOL;  Surgeon: Juanita Craver, MD;  Location: [facility];  Service: Endoscopy;  Laterality: N/A;  . Colonoscopy with propofol N/A 11/28/2014    Procedure: COLONOSCOPY WITH PROPOFOL;  Surgeon: Juanita Craver, MD;  Location: [facility];  Service: Endoscopy;   Laterality: N/A;   Family History  Problem Relation Age of Onset  . Osteoporosis Mother   . CVA Mother   . Cancer Father     pancreatic  . Breast cancer Paternal Aunt    Social History  Substance Use Topics  . Smoking status: Never Smoker   . Smokeless tobacco: Never Used  . Alcohol Use: No   OB History    Gravida Para Term Preterm AB TAB SAB Ectopic Multiple Living   1 1 1       1      Review of Systems  Gastrointestinal: Negative for nausea and vomiting.  Musculoskeletal: Negative for back pain, joint swelling and neck pain.  Skin: Positive for color change and pallor.  Neurological: Negative for weakness and numbness.      Allergies  Gentamicin; Influenza vaccines; Other; Almond oil; Amoxicillin; Doxycycline; Eggs or egg-derived products; Erythromycin; Indomethacin; Pyridium; Soybean-containing drug products; Tetracyclines & related; Tramadol; and Wheat bran  Home Medications   Prior to Admission medications   Medication Sig Start Date End Date Taking? Authorizing Provider  B Complex Vitamins (VITAMIN B COMPLEX IJ) Inject as directed every 30 (thirty) days.   Yes Historical Provider, MD  cephALEXin (KEFLEX) 500 MG capsule Take 1,000 mg by mouth. Take 1 hour dental work   Yes Management consultant, MD  Cholecalciferol (VITAMIN D PO) Take 5,000 Units by mouth every morning.    Yes Historical Provider, MD  EPIPEN 2-PAK 0.3 MG/0.3ML SOAJ injection Inject 0.3 mg as directed as needed (for allergies/never used).  01/20/14  Yes Historical Provider, MD  metroNIDAZOLE (METROGEL) 1 % gel Apply 1 application topically at bedtime as needed (irritation).    Yes Historical Provider, MD  ibuprofen (ADVIL,MOTRIN) 800 MG tablet Take 1 tablet (800 mg total) by mouth 3 (three) times daily. 02/09/15   Noemi Chapel, MD  sulfamethoxazole-trimethoprim (BACTRIM DS,SEPTRA DS) 800-160 MG per tablet Take 1 tablet by mouth 2 (two) times daily. 02/09/15 02/16/15  Noemi Chapel, MD   BP 124/74 mmHg  Pulse  80  Temp(Src) 98.2 F (36.8 C) (Oral)  Resp 16  SpO2 99%  LMP 07/17/2012 Physical Exam  Constitutional: She appears well-developed and well-nourished. No distress.  HENT:  Head: Normocephalic and atraumatic.  Eyes: Conjunctivae are normal. No scleral icterus.  Cardiovascular: Normal rate, regular rhythm and intact distal pulses.   Pulmonary/Chest: Effort normal and breath sounds normal.  Musculoskeletal: She exhibits tenderness ( pallor and pain in the R thumb - there is some blanching and return of circulation within 3 seconds.  other hand has brisk refill.). She exhibits no edema.  Normal ROM of the IP of the R thumb and normal ROM of the MTP - has normal intact extensor and flexor tendons of the thumb on the R.  Neurological: She is alert.  Skin: Skin is warm and dry. No rash noted. She is not diaphoretic.  Nursing note and vitals reviewed.   ED Course  Procedures (including critical care time) Labs Review Labs Reviewed - No data to display  Imaging Review Dg Finger Thumb Right  02/09/2015   CLINICAL DATA:  Patient was attempting to throw away and expired epi pen in accidentally stabbed herself in the distal thumb. Unsure if needle is in the finger. Bruising to the distal right thumb.  EXAM: RIGHT THUMB 2+V  COMPARISON:  06/04/2003  FINDINGS: Soft tissue swelling about the distal aspect of the right first finger. Suggestion of slight soft tissue gas. Changes are consistent with history of penetrating injury. No radiopaque soft tissue foreign bodies identified. No evidence of acute fracture or dislocation.  IMPRESSION: Soft tissue swelling and slight soft tissue gas in the distal right first finger. No radiopaque foreign bodies.   Electronically Signed   By: Lucienne Capers M.D.   On: 02/09/2015 19:18   I have personally reviewed and evaluated these images and lab results as part of my medical decision-making.    MDM   Final diagnoses:  Needle stick injury    Pain at  palpation of the injection site - has pain with manipulation and palpation.    Improved with nitro paste - improved with hot soaks  D/w poison control X 2 - recommendations followed with improvement - VS normal - stable for d/c. Pt expresses understanding to the indications for return. Meds given in ED:  Medications  nitroGLYCERIN (NITROGLYN) 2 % ointment 0.5 inch (0.5 inches Topical Given 02/09/15 1958)    New Prescriptions   IBUPROFEN (ADVIL,MOTRIN) 800 MG TABLET    Take 1 tablet (800 mg total) by mouth 3 (three) times daily.   SULFAMETHOXAZOLE-TRIMETHOPRIM (BACTRIM DS,SEPTRA DS) 800-160 MG PER TABLET    Take 1 tablet by mouth 2 (two) times daily.      Noemi Chapel, MD 02/09/15 2101

## 2015-02-09 NOTE — ED Notes (Signed)
Bed: ZX28 Expected date:  Expected time:  Means of arrival:  Comments: EMS-epipen

## 2015-02-09 NOTE — Discharge Instructions (Signed)
Return for worsening pain / swelling, pus drainage or fevers Use a small amount of topical nitro paste every hour - change once an hour with 20 minutes of paste free time in between. See your doctor in 1-2 days for recheck  Please obtain all of your results from medical records or have your doctors office obtain the results - share them with your doctor - you should be seen at your doctors office in the next 2 days. Call today to arrange your follow up. Take the medications as prescribed. Please review all of the medicines and only take them if you do not have an allergy to them. Please be aware that if you are taking birth control pills, taking other prescriptions, ESPECIALLY ANTIBIOTICS may make the birth control ineffective - if this is the case, either do not engage in sexual activity or use alternative methods of birth control such as condoms until you have finished the medicine and your family doctor says it is OK to restart them. If you are on a blood thinner such as COUMADIN, be aware that any other medicine that you take may cause the coumadin to either work too much, or not enough - you should have your coumadin level rechecked in next 7 days if this is the case.  ?  It is also a possibility that you have an allergic reaction to any of the medicines that you have been prescribed - Everybody reacts differently to medications and while MOST people have no trouble with most medicines, you may have a reaction such as nausea, vomiting, rash, swelling, shortness of breath. If this is the case, please stop taking the medicine immediately and contact your physician.  ?  You should return to the ER if you develop severe or worsening symptoms.

## 2015-02-09 NOTE — ED Notes (Signed)
Patient was attempting to discard expired epi-pen and she accidentally stuck herself in her right thumb

## 2015-02-09 NOTE — ED Notes (Signed)
Received call from [employer] at [employer].  They recommend: 1- pain control 2-continue warm soaks 3-xray to check for foreign body (tip of epi-pen) 4-low dose nitropaste to help open vessels  Continue to monitor patient for additional 2 hours.  They will followup with [place] at that time.  If xray is negative and pain is under control, she could be discharged; otherwise, when they contact us again in 2 hours a revised plan may be needed.  (Toxicologist - Matt Stripp)

## 2015-02-27 ENCOUNTER — Ambulatory Visit: Payer: Self-pay | Admitting: Certified Nurse Midwife

## 2015-03-02 ENCOUNTER — Ambulatory Visit: Payer: Self-pay | Admitting: Certified Nurse Midwife

## 2015-05-04 ENCOUNTER — Ambulatory Visit: Payer: Self-pay | Admitting: Certified Nurse Midwife

## 2015-05-31 DIAGNOSIS — K760 Fatty (change of) liver, not elsewhere classified: Secondary | ICD-10-CM | POA: Insufficient documentation

## 2015-05-31 DIAGNOSIS — E8881 Metabolic syndrome: Secondary | ICD-10-CM | POA: Insufficient documentation

## 2015-08-16 ENCOUNTER — Ambulatory Visit: Payer: Self-pay | Admitting: Certified Nurse Midwife

## 2015-08-23 ENCOUNTER — Ambulatory Visit: Payer: Self-pay | Admitting: Certified Nurse Midwife

## 2015-08-30 ENCOUNTER — Encounter: Payer: Self-pay | Admitting: Certified Nurse Midwife

## 2015-08-30 ENCOUNTER — Telehealth: Payer: Self-pay

## 2015-08-30 ENCOUNTER — Ambulatory Visit (INDEPENDENT_AMBULATORY_CARE_PROVIDER_SITE_OTHER): Payer: BLUE CROSS/BLUE SHIELD | Admitting: Certified Nurse Midwife

## 2015-08-30 VITALS — BP 120/78 | HR 70 | Resp 16 | Ht 63.25 in | Wt 218.0 lb

## 2015-08-30 DIAGNOSIS — R413 Other amnesia: Secondary | ICD-10-CM

## 2015-08-30 DIAGNOSIS — N951 Menopausal and female climacteric states: Secondary | ICD-10-CM | POA: Diagnosis not present

## 2015-08-30 DIAGNOSIS — Z818 Family history of other mental and behavioral disorders: Secondary | ICD-10-CM

## 2015-08-30 NOTE — Patient Instructions (Signed)

## 2015-08-30 NOTE — Telephone Encounter (Signed)
Referral placed to [facility] for evaluation of recent memory changes. Has a family  Medical history of dementia. Needs an appointment late afternoon after 3 pm if possible.

## 2015-08-30 NOTE — Progress Notes (Signed)
55 y.o.  [id number] Married Caucasian female here to discuss intitation of HRT.  LMP: Patient's last menstrual period was 07/17/2012.Marland Kitchen  Patient reports she's having hot flashes again, no night sweats. These have been present for the past months. Patient was doing well with menopausal symptoms of hot flashes and night sweats, that had resolved. Sees Cardiology only yearly. Sees PCP prn, Dr. Ardeth Perfect. Patient has not had aex since 2015. Patient has been seen in weight loss program with [facility]. Patient lost weight over the past year, with minimal loss and was told she was insulin resistance. Had labs with them last year. Declined medication for weight loss. I just need to know if memory is problem.  I have to make lists, check clocks constantly, forget where I put things, this is not me. Patient due for aex here, but declines labs until we check labs from weight loss center. Mother had dementia and patient is very concerned she is having same symptoms.  Past Medical History  Diagnosis Date  . Celiac disease   . Peripheral neuropathy ([place])   . Ocular rosacea   . MVP (mitral valve prolapse)   . Fibroid   . Arthritis   . Upper respiratory disease     upper respiratory airway disease  . Seizures ([place])     see notes in anesthesia  . Heart murmur     Dr. Kelly,cardiolgy follows  . Pernicious anemia     injections every 4 to 6 weeks -last injestion 09-15-14  . Complication of anesthesia     Slow to awaken. X2 past episodes  of seizures s/p anesthesia following surgery- entering recovery and once in recovery   O: Healthy WDWN  Affect: normal, orientation  Assessment:  Symptomatic menopausal symptoms reoccurrence Memory changes at work and at home Aex needed Lab review from weight loss center  Plan: Discussed hot flashes can return, but may be related to thyroid or stress changes. Would like to do labs to assess, but patient has had recent ones and will obtain copy and review. If not  ones needed will come in for labs. Discussed memory changes are a concern and fee she needs referral to neurology, patient relieved, she feels she needs evaluation too. Will be called with appointment information. Patient will schedule aex soon.  Rv prn, aex   Face to face discussion regarding menopausal symptoms and memory changes for 36 minutes of time.

## 2015-08-30 NOTE — Telephone Encounter (Signed)
Spoke with [facility]. Appointment scheduled for 09/10/2015 at 3:30 pm with 3 pm arrival with Dr.Ahern. Patient is agreeable to appointment date and time. Address and telephone number provided as seen below.   [facility] [address] [place], [place] [address] [phone]  Routing to provider for final review. Patient agreeable to disposition. Will close encounter.

## 2015-08-31 NOTE — Progress Notes (Signed)
Encounter reviewed Neveen Daponte, MD   

## 2015-09-10 ENCOUNTER — Ambulatory Visit (INDEPENDENT_AMBULATORY_CARE_PROVIDER_SITE_OTHER): Payer: BLUE CROSS/BLUE SHIELD | Admitting: Neurology

## 2015-09-10 ENCOUNTER — Encounter: Payer: Self-pay | Admitting: Neurology

## 2015-09-10 VITALS — BP 123/82 | HR 82 | Ht 63.25 in | Wt 220.0 lb

## 2015-09-10 DIAGNOSIS — E538 Deficiency of other specified B group vitamins: Secondary | ICD-10-CM | POA: Diagnosis not present

## 2015-09-10 DIAGNOSIS — R413 Other amnesia: Secondary | ICD-10-CM

## 2015-09-10 DIAGNOSIS — R4789 Other speech disturbances: Secondary | ICD-10-CM | POA: Diagnosis not present

## 2015-09-10 DIAGNOSIS — R4189 Other symptoms and signs involving cognitive functions and awareness: Secondary | ICD-10-CM | POA: Diagnosis not present

## 2015-09-10 NOTE — Progress Notes (Signed)
[facility]    Provider:  Dr Jaynee Jackson Referring Provider: [name], MD Primary Care Physician:  Helen Hatchet, MD  CC:  Memory problems  HPI:  Helen Jackson is a 55 y.o. female here as a referral from Helen. Ardeth Jackson for memory problems. She has a past medical history of mitral valve prolapse, sensorineural hearing loss, dry eyes, fatty liver, rosacea, actinic keratosis.  She has pernicious anemia. She gets B12 shots and has not been as compliant as she should be. In the last 6-8 months something is not right. She was very sick in her 53s and had a complete workup at [facility] and got better (she had eye movements problems, fatigue, pain,memory problems,difficulty swallowing and eventually diagnosed with severe Vitamin D Deficiency). But this is different. She is starting to forget what time it it, she can't keep up with time. She started misplacing things 6-8 months ago, missing bills, needs to make lists now, she needs more post-it notes, cognitive changes progressive over the last 6 months. 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 She did very well on the MMSE today but she feels like she hesitated and wasn't as quickly she should've been. The symptoms have progressed over the last 6-8 months, difficulty expressing herslef, word-finding difficulty, forgetting things like tasks she has to do. She is forgetting things people have asked her, forgetting conversations. She is repeating herself at home. No accidents at home or in the car. History of early-onset Alzheimers in the family in the 41s. Mother and grandmother. Denies confusion. Denies hallucination or delusions, no behavioral changes. She is frustrated with her weight. But she denies stress or depression or any mood changes that may be significantly affecting her perceived memory. She has upper airway respirator disease, not OSA, she had a  sleep study in the past.  She has occ headaches not significant. No significant alcohol use. Never smoked.   Reviewed notes, labs and imaging from outside physicians, which showed: Patient was evaluated at [facility] in 2014 through 2016 for: for low back pain and left foot pain and paresthesias in the foot as far as focal weakness. Evaluation showed that radiculopathy was less likely given absence of any nerve impingement on MRI. She was referred to nerve conduction EMG regarding these symptoms also thought possible piriformis syndrome. EMG was unremarkable and she was given exercises to perform at home and a left piriformis muscle injection was suggested. 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 For her mild proptosis TSH she was checked in 2015 was normal and she was followed by ophthalmology. She also complained of fluctuating ptosis which she reported that the be bad for several weeks at a time usually worse in the a.m., no diplopia but had in the past, reported previous problems with dysphasia, single fiber EMG to eval for myasthenia was negative.  Reviewed EMG nerve conduction study performed 12/12/2014: Patient was complaining of chronic, intermittent history of difficulty eye opening in the morning and with bright lights. She exhibited no fatigable ptosis or disconjugate extraocular movements. Intermittent forced eye closure was noted at several points during testing. Single fiber EMG was negative for possible ocular myasthenia gravis.  Apr 23 2015 [facility]:   TSH  1.640 CBC unremarkable HgbA1c 5.7 CMP with slightly elevated AST and ALT 78 and 85 Creatinine 0.62  Last B12 05/2013 368  MRI of the lumbar spine without contrast December 2014: Unremarkable except for minimal disc bulge without  spinal canal or foraminal stenosis at L2-L3. Other than that no significant focal abnormality or any other level.  EMG nerve conduction study in the lower extremities for paresthesias February 2015 was unremarkable.   Review of Systems: Patient complains of symptoms per HPI as well as the following symptoms: Weight gain, fatigue, blurred vision, anemia, chest pain, palpitations, swelling in legs, shortness of breath, snoring, feeling cold, increased thirst, snoring and hearing loss, trouble swallowing, joint pain, cramps, aching muscles, allergies, skin sensitivity, memory loss, numbness, weakness, difficulty swallowing, tremor, snoring.. Pertinent negatives per HPI. All others negative.   Social History   Social History  . Marital Status: Married    Spouse Name: Helen Jackson   . Number of Children: 1  . Years of Education: 16   Occupational History  . [employer]     Social History Main Topics  . Smoking status: Never Smoker   . Smokeless tobacco: Never Used  . Alcohol Use: No  . Drug Use: No  . Sexual Activity:    Partners: Male    Birth Control/ Protection: Post-menopausal   Other Topics Concern  . Not on file   Social History Narrative   Lives with spouse and son   Caffeine use: 2 to 4 cups     Family History  Problem Relation Age of Onset  . Osteoporosis Mother   . CVA Mother   . Dementia Mother   . Stroke Mother   . Cancer Father     pancreatic  . Breast cancer Paternal Aunt   . Chorea Maternal Grandmother     Past Medical History  Diagnosis Date  . Celiac disease   . Peripheral neuropathy ([place])   . Ocular rosacea   . MVP (mitral valve prolapse)   . Fibroid   . Arthritis   . Upper respiratory disease     upper respiratory airway disease  . Seizures ([place])     see notes in anesthesia  . Heart murmur     Helen. Kelly,cardiolgy follows  . Pernicious anemia     injections every 4 to 6 weeks -last injestion 09-15-14  . Complication of  anesthesia     Slow to awaken. X2 past episodes  of seizures s/p anesthesia following surgery- entering recovery and once in recovery    Past Surgical History  Procedure Laterality Date  . Ankle surgery    . Knee surgery      bilateral  . Cesarean section    . Incision and drainage      bartholin cyst  . Tonsillectomy    . Esophagogastroduodenoscopy (egd) with propofol N/A 11/28/2014    Procedure: ESOPHAGOGASTRODUODENOSCOPY (EGD) WITH PROPOFOL;  Surgeon: Juanita Craver, MD;  Location: [facility];  Service: Endoscopy;  Laterality: N/A;  . Colonoscopy with propofol N/A 11/28/2014    Procedure: COLONOSCOPY WITH PROPOFOL;  Surgeon: Juanita Craver, MD;  Location: [facility];  Service: Endoscopy;  Laterality: N/A;    Current Outpatient Prescriptions  Medication Sig Dispense Refill  . cephALEXin (KEFLEX) 500 MG capsule Take 1,000 mg by mouth. Take 1 hour dental work    . Cholecalciferol (VITAMIN D PO) Take 5,000 Units by mouth every morning.     . Cyanocobalamin (B-12) 1000 MCG/ML KIT Inject as directed. Every  4-6 weeks    . EPIPEN 2-PAK 0.3 MG/0.3ML SOAJ injection Inject 0.3 mg as directed as needed (for allergies/never used). Reported on 08/30/2015    . metroNIDAZOLE (METROGEL) 1 % gel Apply 1 application topically at bedtime as needed (irritation).      No current facility-administered medications for this visit.    Allergies as of 09/10/2015 - Review Complete 09/10/2015  Allergen Reaction Noted  . Gentamicin Anaphylaxis 03/31/2013  . Influenza vaccines Anaphylaxis 07/05/2014  . Other Anaphylaxis 07/05/2014  . Almond oil  05/25/2013  . Amoxicillin Hives 02/21/2014  . Doxycycline  02/21/2014  . Eggs or egg-derived products Nausea And Vomiting 05/25/2013  . Ibuprofen Itching 08/30/2015  . Indomethacin Other (See Comments) 03/31/2013  . Pyridium [phenazopyridine hcl]  03/31/2013  . Soybean-containing drug products Other (See Comments) 05/25/2013  . Tetracyclines & related Hives  02/21/2014  . Tramadol Itching 02/21/2014  . Wheat bran  05/25/2013  . Erythromycin base Rash 08/30/2015    Vitals: BP 123/82 mmHg  Pulse 82  Ht 5' 3.25" (1.607 m)  Wt 220 lb (99.791 kg)  BMI 38.64 kg/m2  LMP 07/17/2012 Last Weight:  Wt Readings from Last 1 Encounters:  09/10/15 220 lb (99.791 kg)   Last Height:   Ht Readings from Last 1 Encounters:  09/10/15 5' 3.25" (1.607 m)   Physical exam: Exam: Gen: NAD, conversant, well nourised, obese, well groomed                     CV: RRR, no MRG. No Carotid Bruits. No peripheral edema, warm, nontender Eyes: Conjunctivae clear without exudates or hemorrhage  Neuro: Detailed Neurologic Exam   Speech:    Speech is normal; fluent and spontaneous with normal comprehension.  Cognition:  MMSE - Mini Mental State Exam 09/10/2015  Orientation to time 5  Orientation to Place 5  Registration 3  Attention/ Calculation 5  Recall 3  Language- name 2 objects 2  Language- repeat 1  Language- follow 3 step command 3  Language- read & follow direction 1  Write a sentence 1  Copy design 1  Total score 30      The patient is oriented to person, place, and time;     recent and remote memory intact;     language fluent;     normal attention, concentration,     fund of knowledge Cranial Nerves:    The pupils are equal, round, and reactive to light. The fundi are normal and spontaneous venous pulsations are present. Visual fields are full to finger confrontation. Extraocular movements are intact. Trigeminal sensation is intact and the muscles of mastication are normal. The face is symmetric. The palate elevates in the midline. Hearing intact. Voice is normal. Shoulder shrug is normal. The tongue has normal motion without fasciculations.   Coordination:    Normal finger to nose and heel to shin.   Gait:    Heel-toe and tandem gait are normal.   Motor Observation:    No asymmetry, no atrophy, and no involuntary movements  noted. Tone:    Normal muscle tone.    Posture:    Posture is normal. normal erect    Strength:    Strength is V/V in the upper and lower limbs.      Sensation: intact to LT and pinprick bilaterally and symmetrically     Reflex Exam:  DTR's:    Deep tendon reflexes in the upper and lower extremities are normal bilaterally.   Toes:  The toes are downgoing bilaterally.   Clonus:    Clonus is absent.       Assessment/Plan:  55 year old female with a family history of early onset Alzheimer's here for concern of progressive memory problems. Mini-Mental status exam was 30 out of 30. No significant deficits were noted clinically and neurologic exam was nonfocal.  MRI brain wo contrast Neurocognitive testing Labs: B12 and folate, methylmalonic acid,  RPR and HIV   Sarina Ill, MD  [facility] [address] [place], [place] [address]  Phone [phone] Fax [phone]

## 2015-09-10 NOTE — Patient Instructions (Addendum)
Remember to drink plenty of fluid, eat healthy meals and do not skip any meals. Try to eat protein with a every meal and eat a healthy snack such as fruit or nuts in between meals. Try to keep a regular sleep-wake schedule and try to exercise daily, particularly in the form of walking, 20-30 minutes a day, if you can.   As far as diagnostic testing: MRI brain and labs  Our phone number is [phone]. We also have an after hours call service for urgent matters and there is a physician on-call for urgent questions. For any emergencies you know to call 911 or go to the nearest emergency room

## 2015-09-11 ENCOUNTER — Telehealth: Payer: Self-pay | Admitting: Neurology

## 2015-09-11 ENCOUNTER — Other Ambulatory Visit (INDEPENDENT_AMBULATORY_CARE_PROVIDER_SITE_OTHER): Payer: Self-pay

## 2015-09-11 DIAGNOSIS — Z0289 Encounter for other administrative examinations: Secondary | ICD-10-CM

## 2015-09-11 DIAGNOSIS — R413 Other amnesia: Secondary | ICD-10-CM

## 2015-09-11 DIAGNOSIS — E538 Deficiency of other specified B group vitamins: Secondary | ICD-10-CM

## 2015-09-11 DIAGNOSIS — R4189 Other symptoms and signs involving cognitive functions and awareness: Secondary | ICD-10-CM

## 2015-09-11 DIAGNOSIS — R4789 Other speech disturbances: Secondary | ICD-10-CM

## 2015-09-11 NOTE — Telephone Encounter (Signed)
Called and spoke to patient and she does not want to wait June or July for Dr. Valentina Shaggy until June or July . Patient would like Dr. Vikki Ports office to call her because of her schedule. [phone].

## 2015-09-12 NOTE — Telephone Encounter (Signed)
[place] , ADAM T - Telephone [phone] -fax [phone] . All records and insurance have been sent and patient is aware of all details and she has contact information for [place].

## 2015-09-13 ENCOUNTER — Telehealth: Payer: Self-pay | Admitting: *Deleted

## 2015-09-13 NOTE — Telephone Encounter (Signed)
LVM for pt to call about results. Gave [facility] phone number.  

## 2015-09-13 NOTE — Telephone Encounter (Signed)
Called and spoke to pt about lab results per Dr Jaynee Eagles note. She will continue to take Vit B12 supplement. Told her to call with any ques. She verbalized understanding.

## 2015-09-13 NOTE — Telephone Encounter (Signed)
Patient returned Emma's call ° °

## 2015-09-13 NOTE — Telephone Encounter (Signed)
-----   Message from Melvenia Beam, MD sent at 09/13/2015 12:19 PM EDT ----- Patient's B12 was 320. 2 years ago it was 368. Normal range is 211-946. RPR and HIV negative. She should continue to take her B12 supplements. thanks

## 2015-09-15 ENCOUNTER — Ambulatory Visit
Admission: RE | Admit: 2015-09-15 | Discharge: 2015-09-15 | Disposition: A | Discharge status: Home / Self Care | Payer: BLUE CROSS/BLUE SHIELD | Source: Ambulatory Visit | Attending: Neurology | Admitting: Neurology

## 2015-09-15 DIAGNOSIS — R4189 Other symptoms and signs involving cognitive functions and awareness: Secondary | ICD-10-CM

## 2015-09-15 DIAGNOSIS — R4789 Other speech disturbances: Secondary | ICD-10-CM | POA: Diagnosis not present

## 2015-09-15 DIAGNOSIS — E538 Deficiency of other specified B group vitamins: Secondary | ICD-10-CM

## 2015-09-15 DIAGNOSIS — R413 Other amnesia: Secondary | ICD-10-CM

## 2015-09-17 ENCOUNTER — Telehealth: Payer: Self-pay | Admitting: *Deleted

## 2015-09-17 LAB — METHYLMALONIC ACID, SERUM: Methylmalonic Acid: 181 nmol/L (ref 0–378)

## 2015-09-17 LAB — B12 AND FOLATE PANEL
Folate: 13.1 ng/mL (ref >3.0)
Vitamin B-12: 320 pg/mL (ref 211–946)

## 2015-09-17 LAB — RPR: RPR Ser Ql: NONREACTIVE

## 2015-09-17 LAB — HIV ANTIBODY (ROUTINE TESTING W REFLEX): HIV Screen 4th Generation wRfx: NONREACTIVE

## 2015-09-17 NOTE — Telephone Encounter (Signed)
-----   Message from Melvenia Beam, MD sent at 09/15/2015  6:02 PM EDT ----- MRI of the brain is normal for age. Incidental mild chronic sinusitis noted. thanks

## 2015-09-17 NOTE — Telephone Encounter (Signed)
LVM for pt to call back. Gave [facility] phone number.  

## 2015-09-17 NOTE — Telephone Encounter (Signed)
LVM for pt to call about results. Gave [facility] phone number.  

## 2015-09-17 NOTE — Telephone Encounter (Signed)
Pt called office back. Spoke to her about normal MRI brain for her age. Relayed it did show incidental mild chronic sinusitis. Told her to f/u with PCP if she is having sinus problems. She verbalized understanding.

## 2015-09-17 NOTE — Telephone Encounter (Signed)
Pt returned call about results °

## 2015-09-19 ENCOUNTER — Other Ambulatory Visit: Payer: Self-pay | Admitting: Certified Nurse Midwife

## 2015-09-19 ENCOUNTER — Telehealth: Payer: Self-pay | Admitting: Certified Nurse Midwife

## 2015-09-19 DIAGNOSIS — N951 Menopausal and female climacteric states: Secondary | ICD-10-CM

## 2015-09-19 NOTE — Telephone Encounter (Signed)
Orders placed.

## 2015-09-19 NOTE — Telephone Encounter (Signed)
Patient called and said, "I am supposed to come in and have some hormone testing but I was told to call and give an update on my visit with the neurologist first."

## 2015-09-19 NOTE — Telephone Encounter (Signed)
Spoke with patient. States that she was seen with Dr.Ahern for neurology evaluation. Reports her MRI and lab work returned normal. She is scheduled for cognitive testing on 12/14/2015. Reports that she is having intermittent hot flashes that vary in intensity. "Debbie said she wanted me to have my hormones checked, but she wanted me to see the neurologist first."  Advised we can proceed with scheduling her lab appointment and I will let Melvia Heaps CNM know how her neurology appointment went. She is agreeable. Lab appointment scheduled for 09/24/2015 at 4 pm. She is agreeable to date and time.  Melvia Heaps CNM, patient will need orders placed for her lab visit.

## 2015-09-21 ENCOUNTER — Other Ambulatory Visit: Payer: BLUE CROSS/BLUE SHIELD

## 2015-09-21 DIAGNOSIS — N951 Menopausal and female climacteric states: Secondary | ICD-10-CM

## 2015-09-22 LAB — THYROID PANEL WITH TSH
Free Thyroxine Index: 2 (ref 1.4–3.8)
T3 Uptake: 23 % (ref 22–35)
T4, Total: 8.9 ug/dL (ref 4.5–12.0)
TSH: 2.02 mIU/L

## 2015-09-22 LAB — ESTRADIOL: Estradiol: 26 pg/mL

## 2015-09-24 ENCOUNTER — Other Ambulatory Visit: Payer: BLUE CROSS/BLUE SHIELD

## 2015-09-25 ENCOUNTER — Telehealth: Payer: Self-pay | Admitting: Certified Nurse Midwife

## 2015-09-25 NOTE — Telephone Encounter (Signed)
Routing to [employer] CNM for review and advise of labs from 09/21/2015.

## 2015-09-25 NOTE — Telephone Encounter (Signed)
Patient is calling to see if her results are in.

## 2015-09-26 NOTE — Telephone Encounter (Signed)
Left message to call [place] at [phone].  Notes Recorded by Regina Eck, CNM on 09/25/2015 at 4:50 PM Notify patient that her estradiol level is normal for menopause  TSH and panel normal

## 2015-09-26 NOTE — Telephone Encounter (Signed)
Note sent with results to be notified

## 2015-09-26 NOTE — Telephone Encounter (Signed)
She may want to try Evening Primrose, but she needs to make sure no contraindications with other medications. She also can start  Vitamin B complex twice daily to see if this helps.

## 2015-09-26 NOTE — Telephone Encounter (Signed)
Spoke with patient. Advised of results as seen below from [place]. She is agreeable and verbalizes understanding. She is requesting recommendations for OTC non-soy products she can try to help with her hot flashes. Advised I will speak with Melvia Heaps CNM and return call with further recommendations. She is agreeable.

## 2015-09-27 NOTE — Telephone Encounter (Signed)
Left message to call Kaitlyn at [phone]. 

## 2015-10-01 NOTE — Telephone Encounter (Signed)
Spoke with patient. Advised of message as seen below from [place]. She is agreeable and verbalizes understanding.  Routing to provider for final review. Patient agreeable to disposition. Will close encounter.

## 2015-11-14 DIAGNOSIS — H5213 Myopia, bilateral: Secondary | ICD-10-CM | POA: Insufficient documentation

## 2015-11-14 DIAGNOSIS — H52203 Unspecified astigmatism, bilateral: Secondary | ICD-10-CM | POA: Insufficient documentation

## 2015-11-15 ENCOUNTER — Telehealth: Payer: Self-pay | Admitting: Neurology

## 2015-12-10 ENCOUNTER — Encounter: Payer: Self-pay | Admitting: Neurology

## 2015-12-10 ENCOUNTER — Encounter: Payer: Self-pay | Admitting: Psychology

## 2015-12-10 ENCOUNTER — Ambulatory Visit: Payer: BLUE CROSS/BLUE SHIELD | Attending: Neurology | Admitting: Psychology

## 2015-12-10 DIAGNOSIS — R4789 Other speech disturbances: Secondary | ICD-10-CM

## 2015-12-10 DIAGNOSIS — R413 Other amnesia: Secondary | ICD-10-CM

## 2015-12-10 DIAGNOSIS — R4189 Other symptoms and signs involving cognitive functions and awareness: Secondary | ICD-10-CM

## 2015-12-10 NOTE — Progress Notes (Addendum)
[facility]  [address]   Telephone [phone] [address] Fax [phone] [place], [place] [address]  Initial Contact Note  Name:  Helen Jackson Date of Birth; March 19, 1961 MRN:  [id number] Date:  12/10/2015  HIEN LADEAU is an 55 y.o. female who was referred for neuropsychological evaluation by Sarina Ill MD due to cognitive difficulties.    A total of 5 hours was spent today reviewing medical records, interviewing (CPT [phone]) Silas Sacramento and administering and scoring neurocognitive tests (CPTs [id number], [place]).  Preliminary Diagnostic Impressions: Memory difficulties [R41.3] Word finding difficulty [R47.89]  There were no concerns expressed or behaviors displayed by Silas Sacramento that would require immediate attention.   A full report will follow once the planned testing has been completed. Her next appointment is scheduled for 12/13/15.    Jamey Ripa, Ph.D Licensed Psychologist 12/10/2015

## 2015-12-13 ENCOUNTER — Ambulatory Visit: Payer: BLUE CROSS/BLUE SHIELD | Admitting: Psychology

## 2015-12-14 ENCOUNTER — Ambulatory Visit: Payer: BLUE CROSS/BLUE SHIELD | Admitting: Psychology

## 2015-12-20 ENCOUNTER — Ambulatory Visit: Payer: BLUE CROSS/BLUE SHIELD | Attending: Psychology | Admitting: Psychology

## 2015-12-20 ENCOUNTER — Encounter: Payer: Self-pay | Admitting: Psychology

## 2015-12-20 DIAGNOSIS — R413 Other amnesia: Secondary | ICD-10-CM | POA: Diagnosis not present

## 2015-12-20 NOTE — Progress Notes (Signed)
[facility]  [address]   Telephone [phone] [address] Fax [phone], [place] [address]   NEUROPSYCHOLOGICAL EVALUATION  *CONFIDENTIAL* This report should not be released without the consent of the client  Name:   Helen Jackson    Date of Birth:  December 28, 2060 [facility] MR#:  [id number] Dates of Evaluation: 12/10/15 & 12/20/15  Reason for Referral Helen Jackson is a 55 year-old right-handed woman who was referred for neuropsychological evaluation by Sarina Ill, MD of [facility]. Helen Jackson has reported a decline in her memory over the past few months. Of concern, she has a family history of early onset Alzheimer's disease. Her neurologic exam on   was non focal. A brain MRI on 09/15/15 showed normal brain volume and  "very minimal" age-appropriate chronic microvascular ischemic change.  Sources of Information Electronic medical records from the [place] were reviewed. Helen Jackson was interviewed.   Chief Complaints Helen Jackson cited her chief concern as difficulty with word finding that began in fall 2016 and has since become more frequent. She reported that she often "goes blank" while speaking and sometimes loses track of the conversational topic. In addition, she has noticed occasionally using grammar incorrectly, such as substituting "who" for "whom". She reported that her son has told her that her reading speed has slowed. She did not cite any other problems with expressing or understand language. She reported having "good days and bad days" with some days not experiencing any such problems. She also reported difficulties with memory over the past few months though described them as less frequent and less disruptive than her word finding difficulty. For example, she reported that within the past six months she has had more difficulty recalling the date, has uncharacteristically forgotten two appointments and forgot  to pay a power bill. Otherwise she was not aware of misplacing objects, forgetting conversations or exhibiting other signs of memory loss. 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 She did not report having had problems performing her basic or instrumental activities of daily living, including cooking and driving.  She reported that she first noticed having cognitive difficulties around the same time that she began to experience resumption of menopause-related hot flashes. She reported earlier having had hot flashes after she started menopause in late 2014 that went away. Another concurrent change was having resumed working fulltime as an Automotive engineer in September 2016. She claimed that her word finding difficulties began prior to starting that job, however.  She was not aware of any changes in her health, medication usage, mood or social situation coincident with the onset of her cognitive difficulties. She reported a prior experience of memory difficulties as well as problems with eye movement, fatigue and difficulty swallowing while in her forties. She was eventually diagnosed with pernicious anemia and a severe Vitamin D deficiency.   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   She described herself as happy in her life. She "loves" her job. The only concern other than with her cognitive functioning  she  expressed was about being overweight due to weight gain over the past ten years. She denied  feeling stressed, anxious or depressed.  Background  She lives with her husband of 91 four years. They have a 64 year old son who lives at home and attends college.  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   She reported that she earned a Dietitian in [employer].  She did not report having had school-based problems with attention or learning.  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  Her current medications include Vitamin D supplements and Vitamin B-12 injections.   She did not report history of unusual childhood illnesses or developmental delays. She reported that she suffered minor head trauma at the age of [age] due to a fall with possible loss of consciousness lasting about one minute. She did not recall having experienced any subsequent symptoms or problems. She reported no history of other head injury or loss of consciousness, stroke-like symptoms or exposure to toxic chemicals. She has had two seizures while awakening from anesthesia following surgery.  She reported rare alcohol use without past history of abuse. She reported that she has never used illicit drugs or tobacco products.   She reported no history of emotional difficulties, use of psychiatric medication or mental health contacts.  With regards to family history, she reported that her mother and maternal grandmother both displayed signs of memory loss in their late forties and were eventually diagnosed with early-onset dementia. Her mother also  suffered a stroke in her senior years.  Observations She appeared as an appropriately dressed and groomed woman in no apparent distress. Gait appeared normal. She interacted in a pleasant and cooperative manner. She spoke in a normal tone of voice, maintained good eye contact and responded to all questions. No problems were evident for speech articulation, prosody, word finding, word selection, message coherence or language comprehension. Her affect appeared within a wide range and without lability. Her mood seemed mildly anxious. She seemed to be a reliable informant. Her thought processes were coherent and organized without loose associations, verbal perseverations or flight of ideas. Her thought content was devoid of unusual or bizarre ideas.  Evaluation Procedures In addition to a review of medical records as well as clinical interviews, the following tests or questionnaires were administered: Animal Naming Test  Beck Anxiety Inventory Beck Depression Inventory-II Boston Naming Test Controlled Oral Word Association Test Rey Complex Figure: Copy Trail Making A & B Wechsler Adult Intelligence Scale-IV:  Music therapist, Coding, Digit Span, Similarities & Symbol Search  Wechsler Memory Scale-IV: Older Adult Battery Wide Range Achievement Test-4: Word Reading Wisconsin Card Sorting Test  Assessment Results Test Validity & Interpretive Considerations It was concluded that the test results represented a valid measure of her current cognitive functioning. She did not display signs of physical or emotional distress. She did later admit to some test anxiety. She did not report or display problems with vision (she wore her eyeglasses) or motor skills. At one point, she decided to move to the left side of the tester given her diminished left ear hearing and thereafter  reported hearing better. She appeared consistently alert and persisted to task. There were no signs of careless, impulsive or  perseverative responding. She appeared to expend maximal effort.   Her pre-morbid intellectual potential was estimated to fall within the Average range based on her educational background (i.e., college graduate) coupled with a measure of word reading (Wide Range Achievement Test-4) that is usually resistant to the effects of neurological and psychiatric disorder.  Her test scores were corrected to reflect norms for her age and, whenever possible, her gender and educational level (i.e., 16 years). A listing of test results can be found at the end of this report.  Speed of Processing & Executive function Her performances on tests of processing speed that required transcription of symbols to match digits using a key (Wechsler Adult Intelligence Scale-IV (WAIS-IV) Coding) or drawing lines to connect randomly arrayed numbers in sequence (Trails A) fell within the Average range.   Her performances on measures of short-term attentional capacity (i.e., working memory) that required mentally rearranging digits in reverse or ascending order (WAIS-IV Digit Span) or immediately recalling spatial locations within a grid (WMS-IV Spatial Addition) were within normal limits.   She completed a test that required visual scanning coupled with set shifting (Trails B) within an Average amount of time and did not deviate from the correct sequence.   Measures of word generative fluency (Controlled Oral Word Association Test) and categorical naming fluency (Animal Naming Test) were within the Average range.   Her ability to express abstract verbal ideas (WAIS-IV Similarities) was within the Average range. She also performed within the Average range on a test of nonverbal problem-solving [employer]) as she efficiently inferred the initial principle to sort geometric designs, adequately maintained set and shifted to a new strategy in response to ongoing feedback.    Learning & Memory A measure of her  immediate memory (WMS-IV Immediate Memory Index) fell within the Average range at the 32nd percentile. Her immediate memory subtest scores ranged from the 16th to the 63rd percentile. It is possible that her left ear hearing loss contributed to her Low Average performance on a word-pair learning task as her performance on this task dramatically improved after she repositioned herself so her right ear was closer to the speaker.   A measure of her delayed memory (WMS-IV Delayed Memory Index) fell within the Borderline range at the 5th percentile. The bulk of the decline in delayed memory was due to her especially poor delayed free recall of figural drawings. That her performance on a recognition format was within the Average range suggested that she had stored these designs in memory but could not spontaneously retrieve them. In addition, while measures of her delayed recall of stories and of designs and their spatial locations were within the Average range as compared to persons her age, they were slightly lower than expected given her level of initial learning.   Language Her performance on a measure of confrontational naming [facility] Naming Test) fell at the lower boundary of the Average range. Her phonemic fluency, as measured by her ability to generate words to designated letters (Controlled Oral Word Association Test), fell within the Average range. Her ability to read words (Wide Range Achievement Test-4: Word Reading) fell within the Average range.  Visual-Spatial Perception & Organization There were no signs of spatial inattention or problems with visual recognition. Her score on a test of visuospatial organization that required assembly of two-dimensional block designs from models Product manager) fell  within the Average. Her copy of a spatially complex geometric figure (Rey Complex Figure) was within acceptable limits.     Emotional Status On the Beck Depression Inventory-II, her score of 3  fell within the minimal range. She endorses only a lower energy level, sleeping somewhat less and a having a somewhat greater appetite. Of note, she did not endorse having had any suicidal thoughts within the past two weeks. On the [facility], her score of 14 was within the mild range though essentially was negligible as she attributed many of the somatic symptoms she endorsed to menopause.    Summary & Conclusions Helen Jackson is a 55 year-old woman who stated her chief concern as progressive difficulty with word finding that began in fall 2016. She also cited difficulties with memory over the past few months though described them as less frequent and less disruptive than her word finding difficulty.  Neuropsychological testing indicated mild deficiency for delayed memory amongst otherwise normal performances on measures of learning and non-memory cognitive skills. Given that she first became aware of having subjective cognitive difficulties coincident with the onset of menopausal symptoms, it is probable that she is experiencing cognitive difficulties associated with menopause. Moreover, the effects of her sensorineural hearing loss might also be mildly interfering with her ability to absorb new auditory information. Finally, her transition back to fulltime employment last year might have resulted in more frequent cognitive errors as a function of the higher demands on her cognitive and physical functioning. Of course, her family history of early-onset dementia is of concern though at this point there is insufficient evidence to suspect an underlying neurological disorder.   Diagnostic Impression Memory difficulties [R41.3]  Recommendations 1. Given her report of especially reduced left ear hearing, she was advised to position herself to the left side while facing a speaker whenever possible to maximize her hearing.  2. The current test results have value as a baseline of her  neurocognitive functioning. Should she report or demonstrate substantial cognitive decline in the future, then a repeat neuropsychological evaluation should be considered as a means to track her neurocognitive functioning.   I have appreciated the opportunity to evaluate Helen Jackson. Please feel free to contact me with any comments or questions.     ______________________ Jamey Ripa, Ph.D Licensed Psychologist       ADDENDUM-NEUROPSYCHOLOGICAL TEST RESULTS  Animal Naming Test Score= 26 75th (adjusted for age, gender and educational level)   Boston Naming Test Score= [phone] 25th (adjusted for age, gender and educational level)   Controlled Oral Word Association Test Score=  46 words/0 repetitions  53rd (adjusted for age, gender and educational level)   Rey Complex Figure: copy  Score= 34/36 Normal   Trails A Score=  25s 0e 47th (adjusted for age, gender and educational level)  Trails B Score= 58s 0e 47th (adjusted for age, gender and educational level)   Wechsler Adult Intelligence Scale-IV   Subtest Scaled Score Percentile  Block Design   9 37th   Similarities  11 63rd    Digit Span  Forward                Backward                Sequencing  11  13  11   7  63rd             84th        50th      16th  Coding    11 63rd       Wechsler Memory Scale-IV   Index Index Score Percentile  Immediate Memory  93     32nd        Auditory Memory  93    32nd           Visual Memory  81    10th         Delayed Memory  76      5th         Spatial Addition   Scaled score= 13    84th           Wide Range Achievement Test-4 Subtest  Raw score Standard score Percentile  Word Reading 64/70 104  61st        Wisconsin Card Sorting Test  Total errors= 15   50th (adjusted for age and education)  Perseverative errors=   7   50th     Categories=  6 >16th     Trials to first category= 10 >16th   Failure to maintain set   0 >16th   Learning to learn= -5.71 6th - 10th

## 2015-12-26 ENCOUNTER — Ambulatory Visit: Payer: BLUE CROSS/BLUE SHIELD | Admitting: Neurology

## 2016-01-08 ENCOUNTER — Encounter: Payer: Self-pay | Admitting: Neurology

## 2016-01-08 ENCOUNTER — Ambulatory Visit (INDEPENDENT_AMBULATORY_CARE_PROVIDER_SITE_OTHER): Payer: BLUE CROSS/BLUE SHIELD | Admitting: Neurology

## 2016-01-08 ENCOUNTER — Telehealth: Payer: Self-pay | Admitting: Cardiovascular Disease

## 2016-01-08 VITALS — BP 119/73 | HR 72 | Wt 216.4 lb

## 2016-01-08 DIAGNOSIS — R413 Other amnesia: Secondary | ICD-10-CM | POA: Diagnosis not present

## 2016-01-08 NOTE — Patient Instructions (Signed)
Remember to drink plenty of fluid, eat healthy meals and do not skip any meals. Try to eat protein with a every meal and eat a healthy snack such as fruit or nuts in between meals. Try to keep a regular sleep-wake schedule and try to exercise daily, particularly in the form of walking, 20-30 minutes a day, if you can.   I would like to see you back in 1 year, sooner if we need to. Please call us with any interim questions, concerns, problems, updates or refill requests.   Our phone number is [phone]. We also have an after hours call service for urgent matters and there is a physician on-call for urgent questions. For any emergencies you know to call 911 or go to the nearest emergency room   

## 2016-01-08 NOTE — Telephone Encounter (Signed)
New Message  Pt call requesting to speak with RN. Pt wanted to discuss if she was going to need a study study completed this year. Please call back to discuss

## 2016-01-08 NOTE — Progress Notes (Signed)
[facility]    Provider:  Dr Jaynee Eagles Referring Provider: [name], MD Primary Care Physician:  [name], MD  CC:  Memory problems  General history 01/08/2016: [name] returns today to discuss findings from Drs. ZElson is neurocognitive testing. Discussed testing in detail, diagnosis did not include degenerative neurocognitive disorder. Discussion about dementia, her family history of early-onset Alzheimer's, patient was quite relieved that she didn't have dementia although she was a little deflated to hear that menopause and stress potentially has something to do with her cognitive complaints.  HPI:  Helen Jackson is a 55 y.o. female here as a referral from Dr. Ardeth Perfect for memory problems. She has a past medical history of mitral valve prolapse, sensorineural hearing loss, dry eyes, fatty liver, rosacea, actinic keratosis.  She has pernicious anemia. She gets B12 shots and has not been as compliant as she should be. In the last 6-8 months something is not right. 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 But this is different. She is starting to forget what time it it, she can't keep up with time. She started misplacing things 6-8 months ago, missing bills, needs to make lists now, she needs more post-it notes, cognitive changes progressive over the last 6 months. 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 She did very well on the MMSE today but she feels like she hesitated and wasn't as quickly she should've been. The symptoms have progressed over the last 6-8 months, difficulty expressing herslef, word-finding difficulty, forgetting things like tasks she has to do. She is forgetting things  people have asked her, forgetting conversations. She is repeating herself at home. No accidents at home or in the car. History of early-onset Alzheimers in the family in the 38s. Mother and grandmother. Denies confusion. Denies hallucination or delusions, no behavioral changes. She is frustrated with her weight. But she denies stress or depression or any mood changes that may be significantly affecting her perceived memory. She has upper airway respirator disease, not OSA, she had a sleep study in the past.  She has occ headaches not significant. No significant alcohol use. Never smoked.   Reviewed notes, labs and imaging from outside physicians, which showed: Patient was evaluated at [place] in 2014 through 2016 for: for low back pain and left foot pain and paresthesias in the foot as far as focal weakness. Evaluation showed that radiculopathy was less likely given absence of any nerve impingement on MRI. She was referred to nerve conduction EMG regarding these symptoms also thought possible piriformis syndrome. EMG was unremarkable and she was given exercises to perform at home and a left piriformis muscle injection was suggested. 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 For her mild proptosis TSH she was checked in 2015 was normal and she was followed by ophthalmology. She also complained of fluctuating ptosis which she reported that the be bad for several weeks at a time usually worse in the a.m., no diplopia but had in the past, reported previous problems with dysphasia, single fiber EMG to eval for  myasthenia was negative.  Reviewed EMG nerve conduction study performed 12/12/2014: Patient was complaining of chronic, intermittent history of difficulty eye opening in the morning and with bright  lights. She exhibited no fatigable ptosis or disconjugate extraocular movements. Intermittent forced eye closure was noted at several points during testing. Single fiber EMG was negative for possible ocular myasthenia gravis.  Apr 23 2015 [facility]:   TSH 1.640 CBC unremarkable HgbA1c 5.7 CMP with slightly elevated AST and ALT 78 and 85 Creatinine 0.62  Last B12 05/2013 368  MRI of the lumbar spine without contrast December 2014: Unremarkable except for minimal disc bulge without spinal canal or foraminal stenosis at L2-L3. Other than that no significant focal abnormality or any other level.  EMG nerve conduction study in the lower extremities for paresthesias February 2015 was unremarkable.   Review of Systems: Patient complains of symptoms per HPI as well as the following symptoms: Weight gain, fatigue, blurred vision, anemia, chest pain, palpitations, swelling in legs, shortness of breath, snoring, feeling cold, increased thirst, snoring and hearing loss, trouble swallowing, joint pain, cramps, aching muscles, allergies, skin sensitivity, memory loss, numbness, weakness, difficulty swallowing, tremor, snoring.. Pertinent negatives per HPI. All others negative.     Social History   Social History  . Marital status: Married    Spouse name: Herbie Baltimore   . Number of children: 1  . Years of education: 70   Occupational History  . [employer]     Social History Main Topics  . Smoking status: Never Smoker  . Smokeless tobacco: Never Used  . Alcohol use No  . Drug use: No  . Sexual activity: Yes    Partners: Male    Birth control/ protection: Post-menopausal   Other Topics Concern  . Not on file   Social History Narrative   Lives with spouse and son   Caffeine use: 2 to 4 cups     Family History  Problem Relation Age of Onset  . Osteoporosis Mother   . CVA Mother   . Dementia Mother   . Stroke Mother   . Cancer Father     pancreatic  . Breast  cancer Paternal Aunt   . Chorea Maternal Grandmother     Past Medical History:  Diagnosis Date  . Arthritis   . Celiac disease   . Complication of anesthesia    Slow to awaken. X2 past episodes  of seizures s/p anesthesia following surgery- entering recovery and once in recovery  . Fibroid   . Heart murmur    Dr. Kelly,cardiolgy follows  . MVP (mitral valve prolapse)   . Ocular rosacea   . Peripheral neuropathy ([address])   . Pernicious anemia    injections every 4 to 6 weeks -last injestion 09-15-14  . Seizures ([place])    see notes in anesthesia  . Upper respiratory disease    upper respiratory airway disease    Past Surgical History:  Procedure Laterality Date  . ANKLE SURGERY    . CESAREAN SECTION    . COLONOSCOPY WITH PROPOFOL N/A 11/28/2014   Procedure: COLONOSCOPY WITH PROPOFOL;  Surgeon: Juanita Craver, MD;  Location: [facility];  Service: Endoscopy;  Laterality: N/A;  . ESOPHAGOGASTRODUODENOSCOPY (EGD) WITH PROPOFOL N/A 11/28/2014   Procedure: ESOPHAGOGASTRODUODENOSCOPY (EGD) WITH PROPOFOL;  Surgeon: Juanita Craver, MD;  Location: [facility];  Service: Endoscopy;  Laterality: N/A;  . INCISION AND DRAINAGE     bartholin cyst  . KNEE SURGERY     bilateral  .  TONSILLECTOMY      Current Outpatient Prescriptions  Medication Sig Dispense Refill  . cephALEXin (KEFLEX) 500 MG capsule Take 1,000 mg by mouth. Take 1 hour dental work    . Cholecalciferol (VITAMIN D PO) Take 5,000 Units by mouth every morning.     . Cyanocobalamin (B-12) 1000 MCG/ML KIT Inject as directed. Every 4-6 weeks    . EPIPEN 2-PAK 0.3 MG/0.3ML SOAJ injection Inject 0.3 mg as directed as needed (for allergies/never used). Reported on 08/30/2015    . metroNIDAZOLE (METROGEL) 1 % gel Apply 1 application topically at bedtime as needed (irritation).     . Multiple Vitamins-Minerals (MULTIVITAMIN ADULTS PO) Take 1 tablet by mouth daily.     No current facility-administered medications for this visit.     Allergies  as of 01/08/2016 - Review Complete 01/08/2016  Allergen Reaction Noted  . Gentamicin Anaphylaxis 03/31/2013  . Influenza vaccines Anaphylaxis 07/05/2014  . Other Anaphylaxis 07/05/2014  . Almond oil  05/25/2013  . Amoxicillin Hives 02/21/2014  . Doxycycline  02/21/2014  . Eggs or egg-derived products Nausea And Vomiting 05/25/2013  . Ibuprofen Itching 08/30/2015  . Indomethacin Other (See Comments) 03/31/2013  . Pyridium [phenazopyridine hcl]  03/31/2013  . Soybean-containing drug products Other (See Comments) 05/25/2013  . Tetracyclines & related Hives 02/21/2014  . Tramadol Itching 02/21/2014  . Wheat bran  05/25/2013  . Erythromycin base Rash 08/30/2015    Vitals: BP 119/73   Pulse 72   Wt 216 lb 6.4 oz (98.2 kg)   LMP 07/17/2012   BMI 38.03 kg/m  Last Weight:  Wt Readings from Last 1 Encounters:  01/08/16 216 lb 6.4 oz (98.2 kg)   Last Height:   Ht Readings from Last 1 Encounters:  09/10/15 5' 3.25" (1.607 m)     Assessment/Plan:  55 year old female with a family history of early onset Alzheimer's here for concern of progressive memory problems. Mini-Mental status exam was 30 out of 30. No significant deficits were noted clinically and neurologic exam was nonfocal.  MRI brain wo contrast unremarkable Neurocognitive testing not c/w dementia or major cognitive impairment Labs: B12 and folate, methylmalonic acid,  RPR and HIV nml Sarina Ill, MD  [facility] [address] [place], [place] [address]  Phone [phone] Fax [phone]  A total of 30 minutes was spent face-to-face with this patient. Over half this time was spent on counseling patient on the fhx dementia diagnosis and different diagnostic and therapeutic options available.

## 2016-01-08 NOTE — Telephone Encounter (Signed)
Returned call to patient. She saw neurologist today for memory testing who asked if she had ever had a sleep study before. She states she had one in Dec 2014 or Jan 2015 w/Dr. Claiborne Billings. Neurologist recommended that she f/up with Dr. Claiborne Billings. Informed patient that her last visit was 12/2013 and Dr. Claiborne Billings had recommended a 6 month ROV. She apologized that this was missed. 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 Patient scheduled 11/20 @ 0815. Explained that if sleep study needs to be repeated, it will be discussed at this visit. She states her last sleep study did not show OSA

## 2016-01-09 ENCOUNTER — Other Ambulatory Visit: Payer: Self-pay | Admitting: Orthopaedic Surgery

## 2016-01-09 DIAGNOSIS — M4722 Other spondylosis with radiculopathy, cervical region: Secondary | ICD-10-CM

## 2016-01-25 ENCOUNTER — Other Ambulatory Visit: Payer: Self-pay

## 2016-02-01 ENCOUNTER — Other Ambulatory Visit: Payer: Self-pay | Admitting: Gastroenterology

## 2016-02-02 ENCOUNTER — Ambulatory Visit
Admission: RE | Admit: 2016-02-02 | Discharge: 2016-02-02 | Disposition: A | Discharge status: Home / Self Care | Payer: BLUE CROSS/BLUE SHIELD | Source: Ambulatory Visit | Attending: Orthopaedic Surgery | Admitting: Orthopaedic Surgery

## 2016-02-02 ENCOUNTER — Other Ambulatory Visit: Payer: Self-pay

## 2016-02-02 DIAGNOSIS — M4722 Other spondylosis with radiculopathy, cervical region: Secondary | ICD-10-CM

## 2016-05-05 ENCOUNTER — Ambulatory Visit: Payer: BLUE CROSS/BLUE SHIELD | Admitting: Cardiovascular Disease

## 2016-06-12 ENCOUNTER — Encounter (HOSPITAL_COMMUNITY): Payer: Self-pay

## 2016-06-12 ENCOUNTER — Ambulatory Visit (HOSPITAL_COMMUNITY): Admit: 2016-06-12 | Payer: BLUE CROSS/BLUE SHIELD | Admitting: Gastroenterology

## 2016-06-12 SURGERY — COLONOSCOPY WITH PROPOFOL
Anesthesia: Monitor Anesthesia Care

## 2016-11-28 ENCOUNTER — Ambulatory Visit: Payer: BLUE CROSS/BLUE SHIELD | Admitting: Certified Nurse Midwife

## 2016-12-30 ENCOUNTER — Ambulatory Visit: Payer: BLUE CROSS/BLUE SHIELD | Admitting: Certified Nurse Midwife

## 2017-01-07 ENCOUNTER — Ambulatory Visit (INDEPENDENT_AMBULATORY_CARE_PROVIDER_SITE_OTHER): Payer: BLUE CROSS/BLUE SHIELD | Admitting: Neurology

## 2017-01-07 DIAGNOSIS — R4189 Other symptoms and signs involving cognitive functions and awareness: Secondary | ICD-10-CM | POA: Diagnosis not present

## 2017-01-07 DIAGNOSIS — F439 Reaction to severe stress, unspecified: Secondary | ICD-10-CM

## 2017-01-07 NOTE — Patient Instructions (Signed)
Remember to drink plenty of fluid, eat healthy meals and do not skip any meals. Try to eat protein with a every meal and eat a healthy snack such as fruit or nuts in between meals. Try to keep a regular sleep-wake schedule and try to exercise daily, particularly in the form of walking, 20-30 minutes a day, if you can.   As far as your medications are concerned, I would like to suggest  As far as diagnostic testing:   I would like to see you back in XXXXX, sooner if we need to. Please call us with any interim questions, concerns, problems, updates or refill requests.   Please also call us for any test results so we can go over those with you on the phone.  My clinical assistant and will answer any of your questions and relay your messages to me and also relay most of my messages to you.   Our phone number is [phone]. We also have an after hours call service for urgent matters and there is a physician on-call for urgent questions. For any emergencies you know to call 911 or go to the nearest emergency room   

## 2017-01-07 NOTE — Progress Notes (Signed)
[facility]    Provider:  Dr Jaynee Eagles Referring Provider: [name], MD Primary Care Physician:  [name], MD   CC: Memory problems  Interval history 01/07/2017: She feels better as far as her memory. She is trying to lose weight. She feels she may have OCD, she will re-read paragraphs over and over, she re-writes lists. It is more "in my head" and not compulsions like repetitive motions. She rechecks things at school. It bothers her, re-reading and double checking everything. It is putting internal stress in her. Discussed  OCD, reviewed DSM criteria.    General history 01/08/2016: [name] returns today to discuss findings from Drs. ZElson is neurocognitive testing. Discussed testing in detail, diagnosis did not include degenerative neurocognitive disorder. Discussion about dementia, her family history of early-onset Alzheimer's, patient was quite relieved that she didn't have dementia although she was a little deflated to hear that menopause and stress potentially has something to do with her cognitive complaints.  UEA:[id number] M Masonis a 56 y.o.femalehere as a referral from Dr. Elby Beck memory problems. She has a past medical history of mitral valve prolapse, sensorineural hearing loss, dry eyes, fatty liver, rosacea, actinic keratosis. She has pernicious anemia. She gets B12 shots and has not been as compliant as she should be. In the last 6-8 months something is not right. 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 But this is different. She is starting to forget what time it it, she can't keep up with time. She started misplacing things 6-8 months ago, missing bills, needs to make lists now, she needs more post-it notes, cognitive changes progressive over the  last 6 months. 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 She did very well on the MMSE today but she feels like she hesitated and wasn't as quickly she should've been. The symptoms have progressed over the last 6-8 months, difficulty expressing herslef, word-finding difficulty, forgetting things like tasks she has to do. She is forgetting things people have asked her, forgetting conversations. She is repeating herself at home. No accidents at home or in the car. History of early-onset Alzheimers in the family in the 56s. Mother and grandmother. Denies confusion. Denies hallucination or delusions, no behavioral changes. She is frustrated with her weight. But she denies stress or depression or any mood changes that may be significantly affecting her perceived memory. She has upper airway respirator disease, not OSA, she had a sleep study in the past. She has occ headaches not significant. No significant alcohol use. Never smoked.   Reviewed notes, labs and imaging from outside physicians, which showed: Patient was evaluated at [place] in 2014 through 2016 for: for low back pain and left foot pain and paresthesias in the foot as far as focal weakness. Evaluation showed that radiculopathy was less likely given absence of any nerve impingement on MRI. She was referred to nerve conduction EMG regarding these symptoms also thought possible piriformis syndrome. EMG was unremarkable and she was given exercises to perform at home and a left piriformis muscle injection was suggested. 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 For her mild proptosis TSH she was checked in 2015 was normal and she was followed by ophthalmology. She also  complained of fluctuating ptosis which she reported that the be bad for several weeks at a time usually worse in the a.m., no diplopia but had in the past, reported previous problems with dysphasia, single fiber EMG to eval for myasthenia was negative.  Reviewed EMG nerve conduction study performed 12/12/2014: Patient was complaining of chronic, intermittent history of difficulty eye opening in the morning and with bright lights. She exhibited no fatigable ptosis or disconjugate extraocular movements. Intermittent forced eye closure was noted at several points during testing. Single fiber EMG was negative for possible ocular myasthenia gravis.  Apr 23 2015 [facility]:   TSH 1.640 CBC unremarkable HgbA1c 5.7 CMP with slightly elevated AST and ALT 78 and 85 Creatinine 0.62  Last B12 05/2013 368  MRI of the lumbar spine without contrast December 2014: Unremarkable except for minimal disc bulge without spinal canal or foraminal stenosis at L2-L3. Other than that no significant focal abnormality or any other level.  EMG nerve conduction study in the lower extremities for paresthesias February 2015 was unremarkable.   Review of Systems: Patient complains of symptoms per HPI as well as the following symptoms: Weight gain, fatigue, blurred vision, anemia, chest pain, palpitations, swelling in legs, shortness of breath, snoring, feeling cold, increased thirst, snoring and hearing loss, trouble swallowing, joint pain, cramps, aching muscles, allergies, skin sensitivity, memory loss, numbness, weakness, difficulty swallowing, tremor, snoring.. Pertinent negatives per HPI. All others negative.    Social History   Social History  . Marital status: Married    Spouse name: Herbie Baltimore   . Number of children: 1  . Years of education: 60   Occupational History  . [employer]     Social History Main Topics  . Smoking status: Never Smoker  . Smokeless tobacco: Never Used  .  Alcohol use No  . Drug use: No  . Sexual activity: Yes    Partners: Male    Birth control/ protection: Post-menopausal   Other Topics Concern  . Not on file   Social History Narrative   Lives with spouse and son   Caffeine use: 2 to 4 cups     Family History  Problem Relation Age of Onset  . Osteoporosis Mother   . CVA Mother   . Dementia Mother   . Stroke Mother   . Cancer Father        pancreatic  . Breast cancer Paternal Aunt   . Chorea Maternal Grandmother     Past Medical History:  Diagnosis Date  . Arthritis   . Celiac disease   . Complication of anesthesia    Slow to awaken. X2 past episodes  of seizures s/p anesthesia following surgery- entering recovery and once in recovery  . Fibroid   . Heart murmur    Dr. Kelly,cardiolgy follows  . MVP (mitral valve prolapse)   . Ocular rosacea   . Peripheral neuropathy   . Pernicious anemia    injections every 4 to 6 weeks -last injestion 09-15-14  . Seizures ([place])    see notes in anesthesia  . Upper respiratory disease    upper respiratory airway disease    Past Surgical History:  Procedure Laterality Date  . ANKLE SURGERY    . CESAREAN SECTION    . COLONOSCOPY WITH PROPOFOL N/A 11/28/2014   Procedure: COLONOSCOPY WITH PROPOFOL;  Surgeon: Juanita Craver, MD;  Location: [facility];  Service: Endoscopy;  Laterality: N/A;  . ESOPHAGOGASTRODUODENOSCOPY (EGD) WITH PROPOFOL N/A 11/28/2014   Procedure: ESOPHAGOGASTRODUODENOSCOPY (EGD) WITH PROPOFOL;  Surgeon: Juanita Craver, MD;  Location: [facility];  Service: Endoscopy;  Laterality: N/A;  . INCISION AND DRAINAGE     bartholin cyst  . KNEE SURGERY     bilateral  . TONSILLECTOMY      Current Outpatient Prescriptions  Medication Sig Dispense Refill  . cephALEXin (KEFLEX) 500 MG capsule Take 1,000 mg by mouth. Take 1 hour dental work    . Cholecalciferol (VITAMIN D PO) Take 5,000 Units by mouth every morning.     . Cyanocobalamin (B-12) 1000 MCG/ML KIT Inject as  directed. Every 4-6 weeks    . EPIPEN 2-PAK 0.3 MG/0.3ML SOAJ injection Inject 0.3 mg as directed as needed (for allergies/never used). Reported on 08/30/2015    . metroNIDAZOLE (METROGEL) 1 % gel Apply 1 application topically at bedtime as needed (irritation).     . Multiple Vitamins-Minerals (MULTIVITAMIN ADULTS PO) Take 1 tablet by mouth daily.     No current facility-administered medications for this visit.     Allergies as of 01/07/2017 - Review Complete 01/08/2016  Allergen Reaction Noted  . Gentamicin Anaphylaxis 03/31/2013  . Influenza vaccines Anaphylaxis 07/05/2014  . Other Anaphylaxis 07/05/2014  . Almond oil  05/25/2013  . Amoxicillin Hives 02/21/2014  . Doxycycline  02/21/2014  . Eggs or egg-derived products Nausea And Vomiting 05/25/2013  . Ibuprofen Itching 08/30/2015  . Indomethacin Other (See Comments) 03/31/2013  . Pyridium [phenazopyridine hcl]  03/31/2013  . Soybean-containing drug products Other (See Comments) 05/25/2013  . Tetracyclines & related Hives 02/21/2014  . Tramadol Itching 02/21/2014  . Wheat bran  05/25/2013  . Erythromycin base Rash 08/30/2015    Vitals: LMP 07/17/2012  Last Weight:  Wt Readings from Last 1 Encounters:  01/08/17 211 lb (95.7 kg)   Last Height:   Ht Readings from Last 1 Encounters:  01/08/17 5' 3.25" (1.607 m)   Physical exam: Exam: Gen: NAD, conversant, well nourised, obese, well groomed                     CV: RRR, no MRG. No Carotid Bruits. No peripheral edema, warm, nontender Eyes: Conjunctivae clear without exudates or hemorrhage  Neuro: Detailed Neurologic Exam  Speech:    Speech is normal; fluent and spontaneous with normal comprehension.  Cognition:  MMSE - Mini Mental State Exam 09/10/2015  Orientation to time 5  Orientation to Place 5  Registration 3  Attention/ Calculation 5  Recall 3  Language- name 2 objects 2  Language- repeat 1  Language- follow 3 step command 3  Language- read & follow direction  1  Write a sentence 1  Copy design 1  Total score 30      The patient is oriented to person, place, and time;     recent and remote memory intact;     language fluent;     normal attention, concentration,     fund of knowledge Cranial Nerves:    The pupils are equal, round, and reactive to light. The fundi are normal and spontaneous venous pulsations are present. Visual fields are full to finger confrontation. Extraocular movements are intact. Trigeminal sensation is intact and the muscles of mastication are normal. The face is symmetric. The palate elevates in the midline. Hearing intact. Voice is normal. Shoulder shrug is normal. The tongue has normal motion without  fasciculations.   Coordination:    Normal finger to nose and heel to shin. Normal rapid alternating movements.   Gait:    Heel-toe and tandem gait are normal.   Motor Observation:    No asymmetry, no atrophy, and no involuntary movements noted. Tone:    Normal muscle tone.    Posture:    Posture is normal. normal erect    Strength:    Strength is V/V in the upper and lower limbs.      Sensation: intact to LT     Reflex Exam:  DTR's:    Deep tendon reflexes in the upper and lower extremities are normal bilaterally.   Toes:    The toes are downgoing bilaterally.   Clonus:    Clonus is absent.   Assessment/Plan:56 year old female with a family history of early onset Alzheimer's here for concern of progressive memory problems. Mini-Mental status exam was 30 out of 30. No significant deficits were noted clinically and neurologic exam was nonfocal.   Memory: Likely normal cognitive aging and stress OCD: Monitor clinically, can refer to psychiatry if worsens MRI brain wo contrast unremarkable Neurocognitive testing last year not c/w dementia or major cognitive impairment Labs: B12 and folate, methylmalonic acid, RPR and HIV nml F/u as needed. Stress: recommend therapy   Sarina Ill, MD  [facility] [address] [place], [place] [address]  Phone [phone] Fax [phone]  A total of 30 minutes was spent face-to-face with this patient. Over half this time was spent on counseling patient on the fhx dementia diagnosis and different diagnostic and therapeutic options available.

## 2017-01-08 ENCOUNTER — Other Ambulatory Visit (HOSPITAL_COMMUNITY)
Admission: RE | Admit: 2017-01-08 | Discharge: 2017-01-08 | Disposition: A | Discharge status: Home / Self Care | Payer: BLUE CROSS/BLUE SHIELD | Source: Ambulatory Visit | Attending: Certified Nurse Midwife | Admitting: Certified Nurse Midwife

## 2017-01-08 ENCOUNTER — Encounter: Payer: Self-pay | Admitting: Certified Nurse Midwife

## 2017-01-08 ENCOUNTER — Ambulatory Visit (INDEPENDENT_AMBULATORY_CARE_PROVIDER_SITE_OTHER): Payer: BLUE CROSS/BLUE SHIELD | Admitting: Certified Nurse Midwife

## 2017-01-08 VITALS — BP 124/72 | HR 70 | Ht 63.25 in | Wt 211.0 lb

## 2017-01-08 DIAGNOSIS — Z0000 Encounter for general adult medical examination without abnormal findings: Secondary | ICD-10-CM | POA: Diagnosis not present

## 2017-01-08 DIAGNOSIS — Z01419 Encounter for gynecological examination (general) (routine) without abnormal findings: Secondary | ICD-10-CM | POA: Diagnosis not present

## 2017-01-08 DIAGNOSIS — Z124 Encounter for screening for malignant neoplasm of cervix: Secondary | ICD-10-CM

## 2017-01-08 DIAGNOSIS — E559 Vitamin D deficiency, unspecified: Secondary | ICD-10-CM | POA: Insufficient documentation

## 2017-01-08 DIAGNOSIS — N951 Menopausal and female climacteric states: Secondary | ICD-10-CM | POA: Diagnosis not present

## 2017-01-08 NOTE — Patient Instructions (Signed)

## 2017-01-08 NOTE — Progress Notes (Signed)
56 y.o. G61P1001 Married  Caucasian Fe here for annual exam. Menopausal no HRT, denies vaginal bleeding or vaginal dryness. Sees Dr Ardeth Perfect yearly for aex/labs., was referred by me to Dr. Jaynee Eagles for memory changes and evaluation. She was seen per Dr. Jaynee Eagles for by Neuropsychologist in 7/17, no significant problem that required treatment or life style change. Continues on B 12 injections for pernicious anemia. Had MRI with no concerns. Continuing follow up with Dr.Ahern if needed. Saw her yesterday, has been referred to [facility] for weight loss. Excited to try again. Had tried [employer] weight loss, with no change. Plans no visit with PCP this year, so desires labs here today.  Patient's last menstrual period was 07/17/2012.          Sexually active: Yes.    The current method of family planning is post menopausal status.    Exercising: Yes.    walking. Smoker:  no  Health Maintenance: Pap: 02/21/14, Negative  02/18/12, Negative with neg HR HPV  History of Abnormal Pap: no MMG:  08/31/15, 3D, Density Category A, Bi-Rads 1:  Negative Self Breast exams: occasionally Colonoscopy:  11/28/14, Normal-poor prep, repeat in 5 years with 5 day prep BMD:  2017, Dr. Ardeth Perfect office, normal per patient TDaP:  10+ years declines Shingles: none Pneumonia: none Hep C and HIV: yes Labs: fasting labs today   reports that she has never smoked. She has never used smokeless tobacco. She reports that she does not drink alcohol or use drugs.  Past Medical History:  Diagnosis Date  . Arthritis   . Celiac disease   . Complication of anesthesia    Slow to awaken. X2 past episodes  of seizures s/p anesthesia following surgery- entering recovery and once in recovery  . Fibroid   . Heart murmur    Dr. Kelly,cardiolgy follows  . MVP (mitral valve prolapse)   . Ocular rosacea   . Peripheral neuropathy   . Pernicious anemia    injections every 4 to 6 weeks -last injestion 09-15-14  . Seizures ([place])    see notes in  anesthesia  . Upper respiratory disease    upper respiratory airway disease    Past Surgical History:  Procedure Laterality Date  . ANKLE SURGERY    . CESAREAN SECTION    . COLONOSCOPY WITH PROPOFOL N/A 11/28/2014   Procedure: COLONOSCOPY WITH PROPOFOL;  Surgeon: Juanita Craver, MD;  Location: [facility];  Service: Endoscopy;  Laterality: N/A;  . ESOPHAGOGASTRODUODENOSCOPY (EGD) WITH PROPOFOL N/A 11/28/2014   Procedure: ESOPHAGOGASTRODUODENOSCOPY (EGD) WITH PROPOFOL;  Surgeon: Juanita Craver, MD;  Location: [facility];  Service: Endoscopy;  Laterality: N/A;  . INCISION AND DRAINAGE     bartholin cyst  . KNEE SURGERY     bilateral  . TONSILLECTOMY      Current Outpatient Prescriptions  Medication Sig Dispense Refill  . cephALEXin (KEFLEX) 500 MG capsule Take 1,000 mg by mouth. Take 1 hour dental work    . Cholecalciferol (VITAMIN D PO) Take 5,000 Units by mouth every morning.     . Cyanocobalamin (B-12) 1000 MCG/ML KIT Inject as directed. Every 4-6 weeks    . EPIPEN 2-PAK 0.3 MG/0.3ML SOAJ injection Inject 0.3 mg as directed as needed (for allergies/never used). Reported on 08/30/2015    . metroNIDAZOLE (METROGEL) 1 % gel Apply 1 application topically at bedtime as needed (irritation).     . Multiple Vitamins-Minerals (MULTIVITAMIN ADULTS PO) Take 1 tablet by mouth daily.     No current  facility-administered medications for this visit.     Family History  Problem Relation Age of Onset  . Osteoporosis Mother   . CVA Mother   . Dementia Mother   . Stroke Mother   . Cancer Father        pancreatic  . Breast cancer Paternal Aunt   . Chorea Maternal Grandmother     ROS:  Pertinent items are noted in HPI.  Otherwise, a comprehensive ROS was negative.  Exam:   BP 134/80 (BP Location: Right Arm, Patient Position: Sitting, Cuff Size: Large)   Pulse 64   Ht 5' 3.25" (1.607 m)   Wt 211 lb (95.7 kg)   LMP 07/17/2012   BMI 37.08 kg/m  Height: 5' 3.25" (160.7 cm) Ht Readings from  Last 3 Encounters:  01/08/17 5' 3.25" (1.607 m)  09/10/15 5' 3.25" (1.607 m)  08/30/15 5' 3.25" (1.607 m)    General appearance: alert, cooperative and appears stated age Head: Normocephalic, without obvious abnormality, atraumatic Neck: no adenopathy, supple, symmetrical, trachea midline and thyroid normal to inspection and palpation Lungs: clear to auscultation bilaterally Breasts: normal appearance, no masses or tenderness, No nipple retraction or dimpling, No nipple discharge or bleeding, No axillary or supraclavicular adenopathy Heart: regular rate and rhythm Abdomen: soft, non-tender; no masses,  no organomegaly Extremities: extremities normal, atraumatic, no cyanosis or edema Skin: Skin color, texture, turgor normal. No rashes or lesions Lymph nodes: Cervical, supraclavicular, and axillary nodes normal. No abnormal inguinal nodes palpated Neurologic: Grossly normal   Pelvic: External genitalia:  no lesions              Urethra:  normal appearing urethra with no masses, tenderness or lesions              Bartholin's and Skene's: normal                 Vagina: normal appearing vagina with normal color and discharge, no lesions              Cervix: no cervical motion tenderness, no lesions and noted 1 fb in vagina              Pap taken: Yes.   Bimanual Exam:  Uterus:  normal size, contour, position, consistency, mobility, non-tender              Adnexa: normal adnexa and no mass, fullness, tenderness               Rectovaginal: Confirms               Anus:  normal sphincter tone, no lesions  Chaperone present: yes  A:  Well Woman with normal exam  Menopausal no HRT  Weight gain,  History of Vitamin D deficiency  Neurology evaluation for memory changes all negative, still under follow up  Screening labs    P:   Reviewed health and wellness pertinent to exam  Aware of need to evaluate if vaginal bleeding  Patient has appointment with [facility] to  work on weight loss  Will check today with labs  Continue MD follow up as indicated  Labs: CBC, CMP, Hep C, HIV, Lipid panel, TSH,Vit. d  Pap smear: yes   counseled on breast self exam, mammography screening, adequate intake of calcium and vitamin D, diet and exercise  return annually or prn  An After Visit Summary was printed and given to the patient.

## 2017-01-09 ENCOUNTER — Other Ambulatory Visit: Payer: Self-pay | Admitting: Certified Nurse Midwife

## 2017-01-09 DIAGNOSIS — R899 Unspecified abnormal finding in specimens from other organs, systems and tissues: Secondary | ICD-10-CM

## 2017-01-09 LAB — LIPID PANEL
Chol/HDL Ratio: 3 ratio (ref 0.0–4.4)
Cholesterol, Total: 156 mg/dL (ref 100–199)
HDL: 52 mg/dL (ref >39)
LDL Calculated: 88 mg/dL (ref 0–99)
Triglycerides: 80 mg/dL (ref 0–149)
VLDL Cholesterol Cal: 16 mg/dL (ref 5–40)

## 2017-01-09 LAB — CBC
Hematocrit: 41.5 % (ref 34.0–46.6)
Hemoglobin: 13.8 g/dL (ref 11.1–15.9)
MCH: 33.1 pg — ABNORMAL HIGH (ref 26.6–33.0)
MCHC: 33.3 g/dL (ref 31.5–35.7)
MCV: 100 fL — ABNORMAL HIGH (ref 79–97)
Platelets: 229 10*3/uL (ref 150–379)
RBC: 4.17 x10E6/uL (ref 3.77–5.28)
RDW: 13.7 % (ref 12.3–15.4)
WBC: 6.4 10*3/uL (ref 3.4–10.8)

## 2017-01-09 LAB — VITAMIN D 25 HYDROXY (VIT D DEFICIENCY, FRACTURES): Vit D, 25-Hydroxy: 30.6 ng/mL (ref 30.0–100.0)

## 2017-01-09 LAB — COMPREHENSIVE METABOLIC PANEL
ALT: 46 IU/L — ABNORMAL HIGH (ref 0–32)
AST: 37 IU/L (ref 0–40)
Albumin/Globulin Ratio: 1.4 (ref 1.2–2.2)
Albumin: 4 g/dL (ref 3.5–5.5)
Alkaline Phosphatase: 81 IU/L (ref 39–117)
BUN/Creatinine Ratio: 22 (ref 9–23)
BUN: 15 mg/dL (ref 6–24)
Bilirubin Total: 0.4 mg/dL (ref 0.0–1.2)
CO2: 24 mmol/L (ref 20–29)
Calcium: 8.9 mg/dL (ref 8.7–10.2)
Chloride: 104 mmol/L (ref 96–106)
Creatinine, Ser: 0.67 mg/dL (ref 0.57–1.00)
GFR calc Af Amer: 114 mL/min/{1.73_m2} (ref >59)
GFR calc non Af Amer: 99 mL/min/{1.73_m2} (ref >59)
Globulin, Total: 2.8 g/dL (ref 1.5–4.5)
Glucose: 89 mg/dL (ref 65–99)
Potassium: 4.6 mmol/L (ref 3.5–5.2)
Sodium: 141 mmol/L (ref 134–144)
Total Protein: 6.8 g/dL (ref 6.0–8.5)

## 2017-01-09 LAB — HIV ANTIBODY (ROUTINE TESTING W REFLEX): HIV Screen 4th Generation wRfx: NONREACTIVE

## 2017-01-09 LAB — TSH: TSH: 3.16 u[IU]/mL (ref 0.450–4.500)

## 2017-01-09 LAB — HEPATITIS C ANTIBODY: Hep C Virus Ab: <0.1 s/co ratio (ref 0.0–0.9)

## 2017-01-12 LAB — CYTOLOGY - PAP
Diagnosis: NEGATIVE
HPV: NOT DETECTED

## 2017-01-15 ENCOUNTER — Telehealth: Payer: Self-pay | Admitting: *Deleted

## 2017-01-15 NOTE — Telephone Encounter (Signed)
I have attempted to contact this patient by phone with the following results: left message to return call to [place] at [phone] on answering machine (mobile per [facility]).  No personal information given. [phone] (Mobile) *Preferred*

## 2017-01-15 NOTE — Telephone Encounter (Signed)
-----   Message from Regina Eck, CNM sent at 01/09/2017  7:44 AM EDT ----- Notify patient that Vitamin D is borderline normal, Recommend OTC Vitamin D3 1000 IU daily TSH is normal Lipid panel cholesterol 156, triglycerides 80, HDL 552, LDL 88 great profile Liver , kidney glucose profile all normal with exception of ALT at 46, normal< 32 needs repeat in 2 weeks. Avoid all OTC advil, tylenol use, order placed, please schedule

## 2017-01-19 NOTE — Telephone Encounter (Signed)
Pt notified in result note.  Closing encounter. 

## 2017-01-28 ENCOUNTER — Other Ambulatory Visit (INDEPENDENT_AMBULATORY_CARE_PROVIDER_SITE_OTHER): Payer: BLUE CROSS/BLUE SHIELD

## 2017-01-28 DIAGNOSIS — R899 Unspecified abnormal finding in specimens from other organs, systems and tissues: Secondary | ICD-10-CM

## 2017-01-29 ENCOUNTER — Encounter: Payer: Self-pay | Admitting: Obstetrics and Gynecology

## 2017-01-29 LAB — ALT: ALT: 56 IU/L — ABNORMAL HIGH (ref 0–32)

## 2017-01-30 ENCOUNTER — Telehealth: Payer: Self-pay

## 2017-01-30 NOTE — Telephone Encounter (Signed)
-----   Message from Megan Salon, MD sent at 01/30/2017 12:43 AM EDT ----- Please notify pt that her ALT is still mildly elevated.  This is due to her fatty liver disease.  She has seen Dr. Collene Mares for her colonoscopy in 2016 and this was discussed then.  Needs to continue to monitor to watch for chagnes.  Plan to repeat one year.

## 2017-01-30 NOTE — Telephone Encounter (Signed)
Spoke with patient and notified of lab results and Dr.Miller's recommendations.

## 2017-01-30 NOTE — Telephone Encounter (Signed)
Patient returning your call.

## 2017-01-30 NOTE — Telephone Encounter (Signed)
Called patient to review lab results, LMOVM to call me back.

## 2017-06-16 HISTORY — PX: NECK SURGERY: SHX720

## 2017-08-12 NOTE — Telephone Encounter (Signed)
Error

## 2017-10-02 DIAGNOSIS — G8929 Other chronic pain: Secondary | ICD-10-CM | POA: Insufficient documentation

## 2017-10-02 DIAGNOSIS — M25572 Pain in left ankle and joints of left foot: Secondary | ICD-10-CM | POA: Insufficient documentation

## 2017-11-25 ENCOUNTER — Telehealth: Payer: Self-pay | Admitting: *Deleted

## 2017-11-25 NOTE — Telephone Encounter (Signed)
Spoke with pt re: surgical clearance.  Pt has been scheduled to see Dr. Claiborne Billings 12/03/17

## 2017-11-25 NOTE — Telephone Encounter (Signed)
PRIMARY CARDIOLOGIST -- DR [name].      [facility] Pre-operative Risk Assessment    Request for surgical clearance:  1. What type of surgery is being performed?  ACDF ONE LEVEL FUSION ON THE NECK  2. When is this surgery scheduled? June 24,2019   3. What type of clearance is required (medical clearance vs. Pharmacy clearance to hold med vs. Both)? MEDICAL  4. Are there any medications that need to be held prior to surgery and how long?N/A   5. Practice name and name of physician performing surgery? [facility]  -- Dr Camp [place]   6. What is your office phone number [phone]   7.   What is your office fax number [phone]  8.   Anesthesia type (None, local, MAC, general) ?  unknown   Helen Jackson 11/25/2017, 1:50 PM  _________________________________________________________________   (provider comments below)

## 2017-11-25 NOTE — Telephone Encounter (Signed)
   Primary Cardiologist: Previously seen by Dr. Claiborne Billings (last OV in 2015).  Chart reviewed as part of pre-operative protocol coverage. Because of Helen Jackson's past medical history and time since last visit, he/she will require a follow-up visit in order to better assess preoperative cardiovascular risk.  Pre-op covering staff: - Please schedule appointment and call patient to inform them. - Please contact requesting surgeon's office via preferred method (i.e, phone, fax) to inform them of need for appointment prior to surgery.  Lyda Jester, PA-C  11/25/2017, 2:53 PM

## 2017-12-03 ENCOUNTER — Encounter: Payer: Self-pay | Admitting: Cardiovascular Disease

## 2017-12-03 ENCOUNTER — Ambulatory Visit (INDEPENDENT_AMBULATORY_CARE_PROVIDER_SITE_OTHER): Payer: BLUE CROSS/BLUE SHIELD | Admitting: Cardiovascular Disease

## 2017-12-03 VITALS — BP 122/76 | HR 69 | Ht 64.0 in | Wt 208.4 lb

## 2017-12-03 DIAGNOSIS — M5090 Cervical disc disorder, unspecified, unspecified cervical region: Secondary | ICD-10-CM

## 2017-12-03 DIAGNOSIS — Z8249 Family history of ischemic heart disease and other diseases of the circulatory system: Secondary | ICD-10-CM | POA: Diagnosis not present

## 2017-12-03 DIAGNOSIS — Z01818 Encounter for other preprocedural examination: Secondary | ICD-10-CM | POA: Diagnosis not present

## 2017-12-03 DIAGNOSIS — E668 Other obesity: Secondary | ICD-10-CM | POA: Diagnosis not present

## 2017-12-03 NOTE — Patient Instructions (Signed)
Medication Instructions: Your physician recommends that you continue on your current medications as directed. Please refer to the Current Medication list given to you today.  If you need a refill on your cardiac medications before your next appointment, please call your pharmacy.    Follow-Up: Your physician wants you to follow-up in 3 years with Dr. Claiborne Billings. You will receive a reminder letter in the mail two months in advance. If you don't receive a letter, please call our office at [phone] to schedule this follow-up appointment.   Special Instructions: You have been cleared for surgery at a low cardiac risk.   Thank you for choosing Heartcare at [facility]!!

## 2017-12-05 ENCOUNTER — Encounter: Payer: Self-pay | Admitting: Cardiovascular Disease

## 2017-12-05 NOTE — Progress Notes (Signed)
Cardiology Office Note    Date:  12/05/2017   ID:  Helen Jackson, DOB January 28, 1961, MRN [id number]  PCP:  [name], MD  Cardiologist:  [name], MD   Pre-operative cardiology evaluation for surgical clearance.  History of Present Illness:  Helen Jackson is a 57 y.o. female who is scheduled to undergo cervical disc surgery Dr. [name] on Monday, December 07, 2017.  I had seen her remotely in 2015.  She presents to the office today for cardiology evaluation and preoperative clearance.  Ms. Postema has a history of mitral valve prolapse which was diagnosed in her 34s.  She also has a history of pernicious anemia which was diagnosed while in college.  There is family history for premature cardiovascular disease with her mother suffering a stroke in her 57s and a maternal grandmother and mother having a myocardial infarction in their 57s and 57s.   I had seen her in 2014 and last saw her in July 2015. She had gained over 80 pounds in the 10 years previously.  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  She had noticed episodes of shortness of breath and also had begun to develop some issues with sleep.  She underwent a routine treadmill test which was negative for ischemia and exercise 20.6 met workload achieving a maximal heart rate of 173 bpm.  An echo Doppler study in December 2014 showed an EF at 60 to 56% with systolic bowing of the mitral valve without definitive prolapse.  There was trivial MR.  She had normal diastolic parameters.  She underwent a sleep study which suggested increased upper airway resistance syndrome without definitive sleep apnea.  AHI was 2.0/h, however, her respiratory disturbance index was increased at 13.9/h.  Her lowest oxygen desaturation was 90%.  I had last seen her, she had lost weight and had markedly changed her diet and had moved to the [place] area.  I have not seen her in 5 years.  She denies any episodes of chest pain or  significant shortness of breath.  Unaware of palpitations.  She denies exertional dyspnea.  She has developed cervical disc disease and is scheduled to undergo C5-6 cervical discectomy with 1 level fusion of the neck by Dr. [name] on December 07, 2017.  She had seen her primary physician's office PA on Monday who provided general pre-operative clearance.  He now presents for cardiac clearance.   Past Medical History:  Diagnosis Date  . Arthritis   . Celiac disease   . Complication of anesthesia    Slow to awaken. X2 past episodes  of seizures s/p anesthesia following surgery- entering recovery and once in recovery  . Elevated alkaline phosphatase level   . Fibroid   . Heart murmur    Dr. Kelly,cardiolgy follows  . MVP (mitral valve prolapse)   . Ocular rosacea   . Peripheral neuropathy   . Pernicious anemia    injections every 4 to 6 weeks -last injestion 09-15-14  . Seizures ([place])    see notes in anesthesia  . Upper respiratory disease    upper respiratory airway disease    Past Surgical History:  Procedure Laterality Date  . ANKLE SURGERY    . CESAREAN SECTION    . COLONOSCOPY WITH PROPOFOL N/A 11/28/2014   Procedure: COLONOSCOPY WITH PROPOFOL;  Surgeon: Juanita Craver, MD;  [facility]: [facility];  Service: Endoscopy;  Laterality: N/A;  . ESOPHAGOGASTRODUODENOSCOPY (EGD) WITH PROPOFOL N/A 11/28/2014  Procedure: ESOPHAGOGASTRODUODENOSCOPY (EGD) WITH PROPOFOL;  Surgeon: Juanita Craver, MD;  Location: [facility];  Service: Endoscopy;  Laterality: N/A;  . INCISION AND DRAINAGE     bartholin cyst  . KNEE SURGERY     bilateral  . TONSILLECTOMY      Current Medications: Outpatient Medications Prior to Visit  Medication Sig Dispense Refill  . cephALEXin (KEFLEX) 500 MG capsule Take 1,000 mg by mouth. Take 1 hour dental work    . Cholecalciferol (VITAMIN D PO) Take 2,000 Units by mouth every morning.     . Cyanocobalamin (B-12) 1000 MCG/ML KIT Inject as directed. Every 4-6 weeks    .  EPIPEN 2-PAK 0.3 MG/0.3ML SOAJ injection Inject 0.3 mg as directed as needed (for allergies/never used). Reported on 08/30/2015    . metroNIDAZOLE (METROGEL) 1 % gel Apply 1 application topically at bedtime as needed (irritation).     . Multiple Vitamins-Minerals (MULTIVITAMIN ADULTS PO) Take 1 tablet by mouth daily.     No facility-administered medications prior to visit.      Allergies:   Gentamicin; Influenza vaccines; Other; Almond oil; Amoxicillin; Doxycycline; Eggs or egg-derived products; Ibuprofen; Indomethacin; Pyridium [phenazopyridine hcl]; Soybean-containing drug products; Tetracyclines & related; Tramadol; Wheat bran; and Erythromycin base   Social History   Socioeconomic History  . Marital status: Married    Spouse name: Herbie Baltimore   . Number of children: 1  . Years of education: 20  . Highest education level: Not on file  Occupational History  . Occupation: [employer]   Social Needs  . Financial resource strain: Not on file  . Food insecurity:    Worry: Not on file    Inability: Not on file  . Transportation needs:    Medical: Not on file    Non-medical: Not on file  Tobacco Use  . Smoking status: Never Smoker  . Smokeless tobacco: Never Used  Substance and Sexual Activity  . Alcohol use: No    Alcohol/week: 0.0 oz  . Drug use: No  . Sexual activity: Yes    Partners: Male    Birth control/protection: Post-menopausal  Lifestyle  . Physical activity:    Days per week: Not on file    Minutes per session: Not on file  . Stress: Not on file  Relationships  . Social connections:    Talks on phone: Not on file    Gets together: Not on file    Attends religious service: Not on file    Active member of club or organization: Not on file    Attends meetings of clubs or organizations: Not on file    Relationship status: Not on file  Other Topics Concern  . 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  She has 1 child now age 58.  Family History:  The patient's family history includes Breast cancer in her paternal aunt; CVA in her mother; Cancer in her father; Chorea in her maternal grandmother; Dementia in her mother; Osteoporosis in her mother; Stroke in her mother.  Mother died at age 65.  Her father died at age 68 and had pancreatic cancer.  ROS General: Negative; No fevers, chills, or night sweats; obesity HEENT: Negative; No changes in vision or hearing, sinus congestion, difficulty swallowing Pulmonary: Negative; No cough, wheezing,  shortness of breath, hemoptysis Cardiovascular: Negative; No chest pain, presyncope, syncope, palpitations GI: Negative; No nausea, vomiting, diarrhea, or abdominal pain GU: Negative; No dysuria, hematuria, or difficulty voiding Musculoskeletal: Neck discomfort Hematologic/Oncology: Negative; no easy bruising, bleeding Endocrine: Negative; no heat/cold intolerance; no diabetes Neuro: Negative; no changes in balance, headaches Skin: Negative; No rashes or skin lesions Psychiatric: Negative; No behavioral problems, depression Sleep: Negative; No snoring, daytime sleepiness, hypersomnolence, bruxism, restless legs, hypnogognic hallucinations, no cataplexy Other comprehensive 14 point system review is negative.   PHYSICAL EXAM:   VS:  BP 122/76   Pulse 69   Ht 5' 4"  (1.626 m)   Wt 208 lb 6.4 oz (94.5 kg)   LMP 07/17/2012   SpO2 99%   BMI 35.77 kg/m     Repeat blood pressure by me was 120/70  Wt Readings from Last 3 Encounters:  12/03/17 208 lb 6.4 oz (94.5 kg)  01/08/17 211 lb (95.7 kg)  01/08/16 216 lb 6.4 oz (98.2 kg)    Since she was last seen by me in 2015, her weight has decreased from 225 pounds down to 208 pounds.  General: Alert, oriented, no distress.  Skin: normal turgor, no rashes, warm and dry HEENT:  Normocephalic, atraumatic. Pupils equal round and reactive to light; sclera anicteric; extraocular muscles intact; Fundi without hemorrhages or exudates.  Discs flat. Nose without nasal septal hypertrophy Mouth/Parynx benign; Mallinpatti scale 3 Neck: No JVD, no carotid bruits; normal carotid upstroke Lungs: clear to ausculatation and percussion; no wheezing or rales Chest wall: without tenderness to palpitation Heart: PMI not displaced, RRR, s1 s2 normal, 1/6 systolic murmur, no diastolic murmur, no click; no rubs, gallops, thrills, or heaves Abdomen: soft, nontender; no hepatosplenomehaly, BS+; abdominal aorta nontender and not dilated by palpation. Back: no CVA tenderness Pulses 2+ Musculoskeletal: full range of motion, normal strength, no joint deformities Extremities: no clubbing cyanosis or edema, Homan's sign negative  Neurologic: grossly nonfocal; Cranial nerves grossly wnl Psychologic: Normal mood and affect   Studies/Labs Reviewed:   EKG: She had a preoperative ECG done at [facility] this week as part of her preoperative laboratory and ECG assessment.  The report states that this is entirely normal. I was unable to view the ECG personally.  Recent Labs: BMP Latest Ref Rng & Units 01/08/2017 02/24/2014 10/11/2013  Glucose 65 - 99 mg/dL 89 88 87  BUN 6 - 24 mg/dL 15 14 16   Creatinine 0.57 - 1.00 mg/dL 0.67 0.67 0.65  BUN/Creat Ratio 9 - 23 22 - -  Sodium 134 - 144 mmol/L 141 141 139  Potassium 3.5 - 5.2 mmol/L 4.6 4.8 4.7  Chloride 96 - 106 mmol/L 104 106 105  CO2 20 - 29 mmol/L 24 28 27   Calcium 8.7 - 10.2 mg/dL 8.9 9.1 9.0     Hepatic Function Latest Ref Rng & Units 01/28/2017 01/08/2017 02/24/2014  Total Protein 6.0 - 8.5 g/dL - 6.8 6.8  Albumin 3.5 - 5.5 g/dL - 4.0 4.0  AST 0 - 40 IU/L - 37 66(H)  ALT 0 - 32 IU/L 56(H) 46(H) 80(H)  Alk Phosphatase 39 - 117 IU/L - 81 84  Total Bilirubin 0.0 - 1.2 mg/dL - 0.4 0.5    CBC Latest Ref Rng & Units 01/08/2017 10/11/2013  05/25/2013  WBC 3.4 - 10.8 x10E3/uL 6.4 7.7 8.2  Hemoglobin 11.1 - 15.9 g/dL 13.8 14.5 15.3(H)  Hematocrit 34.0 - 46.6 % 41.5 43.2 45.4  Platelets 150 - 379 x10E3/uL 229 219 241  Lab Results  Component Value Date   MCV 100 (H) 01/08/2017   MCV 99.8 10/11/2013   MCV 101.3 (H) 05/25/2013   Lab Results  Component Value Date   TSH 3.160 01/08/2017   No results found for: HGBA1C   BNP No results found for: BNP  ProBNP No results found for: PROBNP   Lipid Panel     Component Value Date/Time   CHOL 156 01/08/2017 0906   TRIG 80 01/08/2017 0906   HDL 52 01/08/2017 0906   CHOLHDL 3.0 01/08/2017 0906   CHOLHDL 3.7 10/11/2013 0819   VLDL 18 10/11/2013 0819   LDLCALC 88 01/08/2017 0906     RADIOLOGY: No results found.   Additional studies/ records that were reviewed today include:  I reviewed the patient's prior echo Doppler study, stress test, and previous evaluations with me.  I reviewed her preoperative laboratory and ECG report.   ASSESSMENT:    1. Preoperative clearance   2. Moderate obesity   3. Family history of heart disease   4. Cervical disc disease      PLAN:  Ms. Mailyn Steichen is a 57 year old female who has a history of remotely diagnosed mitral valve prolapse and family history for mature cardiovascular disease.  Remotely, she had experienced exertional DOS shortness of breath and vague chest pain.  A treadmill test was negative for ischemia.  An echo Doppler study has shown normal systolic function and normal diastolic parameters and although there was systolic bowing of her mitral valve there was no definitive prolapse.  Remotely she was morbidly obese and has lost significant weight but continues to be moderately obese with a BMI of 35.77.  Prior sleep study showed increased upper airway resistance without definitive sleep apnea.  Only, she also had elevated liver function studies which have resolved.  Clinically, she is without cardiovascular  complaints.  She denies any anginal type symptomatology.  There is no change in activity status or exertional dyspnea.  She denies any arrhythmias.  Her ECG taken for preoperative assessment apparently was interpreted as normal.  I feel she is stable from a cardiovascular standpoint to undergo with the planned surgery scheduled for December 07, 2017.  I will see her in 3 years or sooner if issues develop for follow-up evaluation.  Medication Adjustments/Labs and Tests Ordered: Current medicines are reviewed at length with the patient today.  Concerns regarding medicines are outlined above.  Medication changes, Labs and Tests ordered today are listed in the Patient Instructions below. Patient Instructions  Medication Instructions: Your physician recommends that you continue on your current medications as directed. Please refer to the Current Medication list given to you today.  If you need a refill on your cardiac medications before your next appointment, please call your pharmacy.    Follow-Up: Your physician wants you to follow-up in 3 years with Dr. Claiborne Billings. You will receive a reminder letter in the mail two months in advance. If you don't receive a letter, please call our office at [phone] to schedule this follow-up appointment.   Special Instructions: You have been cleared for surgery at a low cardiac risk.   Thank you for choosing Heartcare at [facility]!!         Signed, [name], MD  12/05/2017 8:31 AM    [facility] [address], [place], [place], [place]  [address] Phone: [phone]

## 2017-12-07 ENCOUNTER — Telehealth: Payer: Self-pay | Admitting: Cardiovascular Disease

## 2017-12-07 DIAGNOSIS — M4722 Other spondylosis with radiculopathy, cervical region: Secondary | ICD-10-CM | POA: Insufficient documentation

## 2017-12-07 NOTE — Telephone Encounter (Signed)
Spoke with joy, office note from 12/03/17 containing clearance faxed to [phone].

## 2017-12-07 NOTE — Telephone Encounter (Signed)
New Message:       [facility] calling in reference to pt's clearance. Pt's procedure is today.

## 2018-01-11 ENCOUNTER — Ambulatory Visit: Payer: BLUE CROSS/BLUE SHIELD | Admitting: Neurology

## 2018-01-14 ENCOUNTER — Ambulatory Visit: Payer: BLUE CROSS/BLUE SHIELD | Admitting: Certified Nurse Midwife

## 2018-01-25 ENCOUNTER — Ambulatory Visit: Payer: BLUE CROSS/BLUE SHIELD | Admitting: Neurology

## 2018-05-11 ENCOUNTER — Ambulatory Visit: Payer: BLUE CROSS/BLUE SHIELD | Admitting: Neurology

## 2018-06-21 ENCOUNTER — Telehealth (HOSPITAL_COMMUNITY): Payer: Self-pay | Admitting: *Deleted

## 2018-06-21 NOTE — Telephone Encounter (Signed)
Left VM for patient to return call to set up ABI appt. EM

## 2018-06-23 ENCOUNTER — Telehealth (HOSPITAL_COMMUNITY): Payer: Self-pay | Admitting: *Deleted

## 2018-06-23 NOTE — Telephone Encounter (Signed)
2nd attempt to reach patient. Left VM for patient to call and schedule this appt. EM

## 2018-07-01 ENCOUNTER — Other Ambulatory Visit (HOSPITAL_COMMUNITY): Payer: Self-pay | Admitting: Internal Medicine

## 2018-07-01 DIAGNOSIS — I739 Peripheral vascular disease, unspecified: Secondary | ICD-10-CM

## 2018-07-05 ENCOUNTER — Ambulatory Visit (HOSPITAL_COMMUNITY)
Admission: RE | Admit: 2018-07-05 | Discharge: 2018-07-05 | Disposition: A | Discharge status: Home / Self Care | Payer: BLUE CROSS/BLUE SHIELD | Source: Ambulatory Visit | Attending: Surgery | Admitting: Surgery

## 2018-07-05 DIAGNOSIS — I739 Peripheral vascular disease, unspecified: Secondary | ICD-10-CM | POA: Diagnosis present

## 2019-01-03 ENCOUNTER — Ambulatory Visit: Payer: BLUE CROSS/BLUE SHIELD | Admitting: Certified Nurse Midwife

## 2019-01-11 ENCOUNTER — Other Ambulatory Visit: Payer: Self-pay

## 2019-01-12 ENCOUNTER — Ambulatory Visit: Payer: BC Managed Care – PPO | Admitting: Certified Nurse Midwife

## 2019-01-12 ENCOUNTER — Other Ambulatory Visit: Payer: Self-pay

## 2019-01-12 ENCOUNTER — Encounter: Payer: Self-pay | Admitting: Certified Nurse Midwife

## 2019-01-12 VITALS — BP 110/80 | HR 64 | Temp 97.2°F | Resp 16 | Ht 63.25 in | Wt 218.0 lb

## 2019-01-12 DIAGNOSIS — Z01419 Encounter for gynecological examination (general) (routine) without abnormal findings: Secondary | ICD-10-CM | POA: Diagnosis not present

## 2019-01-12 NOTE — Progress Notes (Signed)
58 y.o. [id number] Married  Caucasian Fe here for annual exam. Denies vaginal bleeding or vaginal dryness. Had been working on weight loss and now has been cooking and eating more. Planning to start working on again. Had Neck surgery this past year and so much better, no pain! Sees PCP Dr. Ardeth Perfect yearly with labs, all normal.  Patient's last menstrual period was 07/17/2012.          Sexually active: Yes.    The current method of family planning is post menopausal status.    Exercising: No.  exercise Smoker:  no  Review of Systems  Constitutional: Negative.   HENT: Negative.   Eyes: Negative.   Respiratory: Negative.   Cardiovascular: Negative.   Gastrointestinal: Negative.   Genitourinary: Negative.   Musculoskeletal: Negative.   Skin: Negative.   Neurological: Negative.   Endo/Heme/Allergies: Negative.   Psychiatric/Behavioral: Negative.     Health Maintenance: Pap:  01-08-17 neg HPV HR neg History of Abnormal Pap: no MMG:  08-31-15 category a density birads 1:neg Self Breast exams: yes Colonoscopy:  2016 normal, poor prep, f/u 72yr with 5 day prep BMD:   2020 normal per patient TDaP:  Declines, due to multiple allergies Shingles: no Pneumonia: no Hep C and HIV: both neg 2018 Labs: if needed   reports that she has never smoked. She has never used smokeless tobacco. She reports that she does not drink alcohol or use drugs.  Past Medical History:  Diagnosis Date  . Arthritis   . Celiac disease   . Complication of anesthesia    Slow to awaken. X2 past episodes  of seizures s/p anesthesia following surgery- entering recovery and once in recovery  . Elevated alkaline phosphatase level   . Fibroid   . Heart murmur    Dr. Kelly,cardiolgy follows  . MVP (mitral valve prolapse)   . Ocular rosacea   . Peripheral neuropathy   . Pernicious anemia    injections every 4 to 6 weeks -last injestion 09-15-14  [place]    see notes in anesthesia  . Upper respiratory disease     upper respiratory airway disease    Past Surgical History:  Procedure Laterality Date  . ANKLE SURGERY    . CESAREAN SECTION    . COLONOSCOPY WITH PROPOFOL N/A 11/28/2014   Procedure: COLONOSCOPY WITH PROPOFOL;  Surgeon: JJuanita Craver MD;  Location: [facility];  Service: Endoscopy;  Laterality: N/A;  . ESOPHAGOGASTRODUODENOSCOPY (EGD) WITH PROPOFOL N/A 11/28/2014   Procedure: ESOPHAGOGASTRODUODENOSCOPY (EGD) WITH PROPOFOL;  Surgeon: JJuanita Craver MD;  Location: [facility];  Service: Endoscopy;  Laterality: N/A;  . INCISION AND DRAINAGE     bartholin cyst  . KNEE SURGERY     bilateral  . NECK SURGERY  2019  . TONSILLECTOMY      Current Outpatient Medications  Medication Sig Dispense Refill  . carboxymethylcellul-glycerin (OPTIVE) 0.5-0.9 % ophthalmic solution Apply to eye.    . cephALEXin (KEFLEX) 500 MG capsule Take by mouth.    . Cholecalciferol (VITAMIN D3) 1.25 MG (50000 UT) CAPS TAKE ONE TABLET ONCE PER WEEK    . Cyanocobalamin (B-12) 1000 MCG/ML KIT Inject as directed. Every 4-6 weeks    . metroNIDAZOLE (METROGEL) 1 % gel Apply 1 application topically at bedtime as needed (irritation).     . Triamcinolone Acetonide (NASACORT AQ NA) Place into the nose.    .Marland KitchenEPIPEN 2-PAK 0.3 MG/0.3ML SOAJ injection Inject 0.3 mg as directed as needed (for allergies/never used). Reported on 08/30/2015  No current facility-administered medications for this visit.     Family History  Problem Relation Age of Onset  . Osteoporosis Mother   . CVA Mother   . Dementia Mother   . Stroke Mother   . Cancer Father        pancreatic  . Breast cancer Paternal Aunt   . Chorea Maternal Grandmother     ROS:  Pertinent items are noted in HPI.  Otherwise, a comprehensive ROS was negative.  Exam:   BP 110/80   Pulse 64   Temp (!) 97.2 F (36.2 C) (Skin)   Resp 16   Ht 5' 3.25" (1.607 m)   Wt 218 lb (98.9 kg)   LMP 07/17/2012   BMI 38.31 kg/m  Height: 5' 3.25" (160.7 cm) Ht Readings from  Last 3 Encounters:  01/12/19 5' 3.25" (1.607 m)  12/03/17 5' 4"  (1.626 m)  01/08/17 5' 3.25" (1.607 m)    General appearance: alert, cooperative and appears stated age Head: Normocephalic, without obvious abnormality, atraumatic Neck: no adenopathy, supple, symmetrical, trachea midline and thyroid normal to inspection and palpation Lungs: clear to auscultation bilaterally Breasts: normal appearance, no masses or tenderness, No nipple retraction or dimpling, No nipple discharge or bleeding, No axillary or supraclavicular adenopathy Heart: regular rate and rhythm Abdomen: soft, non-tender; no masses,  no organomegaly Extremities: extremities normal, atraumatic, no cyanosis or edema Skin: Skin color, texture, turgor normal. No rashes or lesions Lymph nodes: Cervical, supraclavicular, and axillary nodes normal. No abnormal inguinal nodes palpated Neurologic: Grossly normal   Pelvic: External genitalia:  no lesions              Urethra:  normal appearing urethra with no masses, tenderness or lesions              Bartholin's and Skene's: normal                 Vagina: normal appearing vagina with normal color and discharge, no lesions              Cervix: no cervical motion tenderness, no lesions and normal appearance              Pap taken: No. Bimanual Exam:  Uterus:  normal size, contour, position, consistency, mobility, non-tender and retroverted              Adnexa: normal adnexa and no mass, fullness, tenderness               Rectovaginal: Confirms               Anus:  normal sphincter tone, no lesions  Chaperone present: yes  A:  Well Woman with normal exam  Menopausal no HRT  Mammogram due  P:   Reviewed health and wellness pertinent to exam  Aware of need to advise if vaginal bleeding  Stressed SBE and mammogram screening yearly. Patient given information to schedule  Pap smear: no   counseled on breast self exam, mammography screening, feminine hygiene, menopause,  adequate intake of calcium and vitamin D, diet and exercise Discussed weight management for better health.  return annually or prn  An After Visit Summary was printed and given to the patient.

## 2019-03-04 ENCOUNTER — Other Ambulatory Visit: Payer: Self-pay | Admitting: Otolaryngology

## 2019-05-02 ENCOUNTER — Encounter (HOSPITAL_BASED_OUTPATIENT_CLINIC_OR_DEPARTMENT_OTHER): Payer: Self-pay

## 2019-05-02 ENCOUNTER — Other Ambulatory Visit: Payer: Self-pay

## 2019-05-02 NOTE — Progress Notes (Signed)
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

## 2019-05-05 ENCOUNTER — Other Ambulatory Visit (HOSPITAL_COMMUNITY)
Admission: RE | Admit: 2019-05-05 | Discharge: 2019-05-05 | Disposition: A | Discharge status: Home / Self Care | Payer: BC Managed Care – PPO | Source: Ambulatory Visit | Attending: Otolaryngology | Admitting: Otolaryngology

## 2019-05-05 DIAGNOSIS — Z20828 Contact with and (suspected) exposure to other viral communicable diseases: Secondary | ICD-10-CM | POA: Insufficient documentation

## 2019-05-05 DIAGNOSIS — Z01812 Encounter for preprocedural laboratory examination: Secondary | ICD-10-CM | POA: Diagnosis not present

## 2019-05-06 LAB — NOVEL CORONAVIRUS, NAA (HOSP ORDER, SEND-OUT TO REF LAB; TAT 18-24 HRS): SARS-CoV-2, NAA: NOT DETECTED

## 2019-05-09 ENCOUNTER — Encounter (HOSPITAL_BASED_OUTPATIENT_CLINIC_OR_DEPARTMENT_OTHER): Payer: Self-pay

## 2019-05-09 ENCOUNTER — Ambulatory Visit (HOSPITAL_BASED_OUTPATIENT_CLINIC_OR_DEPARTMENT_OTHER): Payer: BC Managed Care – PPO | Admitting: Anesthesiology

## 2019-05-09 ENCOUNTER — Encounter (HOSPITAL_BASED_OUTPATIENT_CLINIC_OR_DEPARTMENT_OTHER)
Admission: RE | Disposition: A | Discharge status: Home / Self Care | Payer: Self-pay | Source: Home / Self Care | Attending: Otolaryngology

## 2019-05-09 ENCOUNTER — Other Ambulatory Visit: Payer: Self-pay

## 2019-05-09 ENCOUNTER — Ambulatory Visit (HOSPITAL_BASED_OUTPATIENT_CLINIC_OR_DEPARTMENT_OTHER)
Admission: RE | Admit: 2019-05-09 | Discharge: 2019-05-09 | Disposition: A | Discharge status: Home / Self Care | Payer: BC Managed Care – PPO | Attending: Otolaryngology | Admitting: Otolaryngology

## 2019-05-09 DIAGNOSIS — J310 Chronic rhinitis: Secondary | ICD-10-CM | POA: Insufficient documentation

## 2019-05-09 DIAGNOSIS — J342 Deviated nasal septum: Secondary | ICD-10-CM | POA: Diagnosis present

## 2019-05-09 DIAGNOSIS — K760 Fatty (change of) liver, not elsewhere classified: Secondary | ICD-10-CM | POA: Insufficient documentation

## 2019-05-09 DIAGNOSIS — R0981 Nasal congestion: Secondary | ICD-10-CM | POA: Insufficient documentation

## 2019-05-09 DIAGNOSIS — J343 Hypertrophy of nasal turbinates: Secondary | ICD-10-CM | POA: Diagnosis not present

## 2019-05-09 DIAGNOSIS — J3489 Other specified disorders of nose and nasal sinuses: Secondary | ICD-10-CM | POA: Diagnosis not present

## 2019-05-09 DIAGNOSIS — D649 Anemia, unspecified: Secondary | ICD-10-CM | POA: Insufficient documentation

## 2019-05-09 DIAGNOSIS — R0600 Dyspnea, unspecified: Secondary | ICD-10-CM | POA: Diagnosis not present

## 2019-05-09 HISTORY — DX: Other sleep disorders: G47.8

## 2019-05-09 HISTORY — PX: NASAL SEPTOPLASTY W/ TURBINOPLASTY: SHX2070

## 2019-05-09 SURGERY — SEPTOPLASTY, NOSE, WITH NASAL TURBINATE REDUCTION
Anesthesia: General | Site: Nose | Laterality: Bilateral

## 2019-05-09 MED ORDER — LACTATED RINGERS IV SOLN
INTRAVENOUS | Status: DC
  Administered 2019-05-09: 08:00:00 via INTRAVENOUS

## 2019-05-09 MED ORDER — FENTANYL CITRATE (PF) 100 MCG/2ML IJ SOLN
INTRAMUSCULAR | Status: AC
  Filled 2019-05-09: qty 2

## 2019-05-09 MED ORDER — MIDAZOLAM HCL 2 MG/2ML IJ SOLN
1.0000 mg | INTRAMUSCULAR | Status: DC | PRN
  Administered 2019-05-09: 2 mg via INTRAVENOUS

## 2019-05-09 MED ORDER — PROPOFOL 10 MG/ML IV BOLUS
INTRAVENOUS | Status: AC
  Filled 2019-05-09: qty 20

## 2019-05-09 MED ORDER — CLINDAMYCIN PHOSPHATE 900 MG/50ML IV SOLN
INTRAVENOUS | Status: DC | PRN
  Administered 2019-05-09: 900 mg via INTRAVENOUS

## 2019-05-09 MED ORDER — MIDAZOLAM HCL 2 MG/2ML IJ SOLN
INTRAMUSCULAR | Status: AC
  Filled 2019-05-09: qty 2

## 2019-05-09 MED ORDER — ACETAMINOPHEN 10 MG/ML IV SOLN
INTRAVENOUS | Status: AC
  Filled 2019-05-09: qty 100

## 2019-05-09 MED ORDER — FENTANYL CITRATE (PF) 100 MCG/2ML IJ SOLN
50.0000 ug | INTRAMUSCULAR | Status: DC | PRN
  Administered 2019-05-09: 100 ug via INTRAVENOUS

## 2019-05-09 MED ORDER — CEFAZOLIN SODIUM-DEXTROSE 2-3 GM-%(50ML) IV SOLR
INTRAVENOUS | Status: DC | PRN
  Administered 2019-05-09: 2 g via INTRAVENOUS

## 2019-05-09 MED ORDER — OXYMETAZOLINE HCL 0.05 % NA SOLN
NASAL | Status: DC | PRN
  Administered 2019-05-09: 1 via TOPICAL

## 2019-05-09 MED ORDER — FENTANYL CITRATE (PF) 100 MCG/2ML IJ SOLN
25.0000 ug | INTRAMUSCULAR | Status: DC | PRN
  Administered 2019-05-09 (×2): 25 ug via INTRAVENOUS

## 2019-05-09 MED ORDER — ONDANSETRON HCL 4 MG/2ML IJ SOLN
INTRAMUSCULAR | Status: AC
  Filled 2019-05-09: qty 2

## 2019-05-09 MED ORDER — PROPOFOL 10 MG/ML IV BOLUS
INTRAVENOUS | Status: DC | PRN
  Administered 2019-05-09: 100 mg via INTRAVENOUS

## 2019-05-09 MED ORDER — LIDOCAINE-EPINEPHRINE 1 %-1:100000 IJ SOLN
INTRAMUSCULAR | Status: DC | PRN
  Administered 2019-05-09: 3 mL

## 2019-05-09 MED ORDER — ACETAMINOPHEN 10 MG/ML IV SOLN
1000.0000 mg | Freq: Once | INTRAVENOUS | Status: DC | PRN
  Administered 2019-05-09: 1000 mg via INTRAVENOUS

## 2019-05-09 MED ORDER — CLINDAMYCIN HCL 300 MG PO CAPS: 300.0000 mg | ORAL_CAPSULE | Freq: Three times a day (TID) | ORAL | 0 refills | Status: AC

## 2019-05-09 MED ORDER — SUGAMMADEX SODIUM 200 MG/2ML IV SOLN
INTRAVENOUS | Status: DC | PRN
  Administered 2019-05-09: 200 mg via INTRAVENOUS

## 2019-05-09 MED ORDER — DEXAMETHASONE SODIUM PHOSPHATE 10 MG/ML IJ SOLN
INTRAMUSCULAR | Status: AC
  Filled 2019-05-09: qty 1

## 2019-05-09 MED ORDER — ROCURONIUM BROMIDE 10 MG/ML (PF) SYRINGE
PREFILLED_SYRINGE | INTRAVENOUS | Status: AC
  Filled 2019-05-09: qty 10

## 2019-05-09 MED ORDER — ROCURONIUM BROMIDE 100 MG/10ML IV SOLN
INTRAVENOUS | Status: DC | PRN
  Administered 2019-05-09: 60 mg via INTRAVENOUS

## 2019-05-09 MED ORDER — LIDOCAINE HCL (CARDIAC) PF 100 MG/5ML IV SOSY
PREFILLED_SYRINGE | INTRAVENOUS | Status: DC | PRN
  Administered 2019-05-09: 100 mg via INTRAVENOUS

## 2019-05-09 MED ORDER — PROMETHAZINE HCL 25 MG/ML IJ SOLN: 6.2500 mg | INTRAMUSCULAR | Status: DC | PRN

## 2019-05-09 MED ORDER — ONDANSETRON HCL 4 MG/2ML IJ SOLN
INTRAMUSCULAR | Status: DC | PRN
  Administered 2019-05-09: 4 mg via INTRAVENOUS

## 2019-05-09 SURGICAL SUPPLY — 35 items
ATTRACTOMAT 16X20 MAGNETIC DRP (DRAPES) IMPLANT
CANISTER SUCT 1200ML W/VALVE (MISCELLANEOUS) ×2 IMPLANT
COAGULATOR SUCT 8FR VV (MISCELLANEOUS) ×2 IMPLANT
COVER WAND RF STERILE (DRAPES) IMPLANT
DECANTER SPIKE VIAL GLASS SM (MISCELLANEOUS) IMPLANT
DRSG NASOPORE 8CM (GAUZE/BANDAGES/DRESSINGS) IMPLANT
DRSG TELFA 3X8 NADH (GAUZE/BANDAGES/DRESSINGS) ×2 IMPLANT
ELECT REM PT RETURN 9FT ADLT (ELECTROSURGICAL) ×2
ELECTRODE REM PT RTRN 9FT ADLT (ELECTROSURGICAL) ×1 IMPLANT
GLOVE BIO SURGEON STRL SZ 6.5 (GLOVE) ×1 IMPLANT
GLOVE BIO SURGEON STRL SZ7.5 (GLOVE) ×2 IMPLANT
GLOVE BIOGEL PI IND STRL 7.0 (GLOVE) IMPLANT
GLOVE BIOGEL PI INDICATOR 7.0 (GLOVE) ×1
GOWN STRL REUS W/ TWL LRG LVL3 (GOWN DISPOSABLE) ×2 IMPLANT
GOWN STRL REUS W/TWL LRG LVL3 (GOWN DISPOSABLE) ×2
NDL HYPO 25X1 1.5 SAFETY (NEEDLE) ×1 IMPLANT
NEEDLE HYPO 25X1 1.5 SAFETY (NEEDLE) ×2 IMPLANT
NS IRRIG 1000ML POUR BTL (IV SOLUTION) ×2 IMPLANT
PACK BASIN DAY SURGERY FS (CUSTOM PROCEDURE TRAY) ×2 IMPLANT
PACK ENT DAY SURGERY (CUSTOM PROCEDURE TRAY) ×2 IMPLANT
PAD DRESSING TELFA 3X8 NADH (GAUZE/BANDAGES/DRESSINGS) IMPLANT
SLEEVE SCD COMPRESS KNEE MED (MISCELLANEOUS) ×1 IMPLANT
SOLUTION BUTLER CLEAR DIP (MISCELLANEOUS) ×2 IMPLANT
SPLINT NASAL AIRWAY SILICONE (MISCELLANEOUS) ×2 IMPLANT
SPONGE GAUZE 2X2 8PLY STRL LF (GAUZE/BANDAGES/DRESSINGS) ×2 IMPLANT
SPONGE NEURO XRAY DETECT 1X3 (DISPOSABLE) ×2 IMPLANT
SUT CHROMIC 4 0 P 3 18 (SUTURE) ×2 IMPLANT
SUT PLAIN 4 0 ~~LOC~~ 1 (SUTURE) ×2 IMPLANT
SUT PROLENE 3 0 PS 2 (SUTURE) ×2 IMPLANT
SUT VIC AB 4-0 P-3 18XBRD (SUTURE) IMPLANT
SUT VIC AB 4-0 P3 18 (SUTURE)
TOWEL GREEN STERILE FF (TOWEL DISPOSABLE) ×2 IMPLANT
TUBE SALEM SUMP 12R W/ARV (TUBING) IMPLANT
TUBE SALEM SUMP 16 FR W/ARV (TUBING) ×2 IMPLANT
YANKAUER SUCT BULB TIP NO VENT (SUCTIONS) ×2 IMPLANT

## 2019-05-09 NOTE — Anesthesia Procedure Notes (Signed)
Procedure Name: Intubation Performed by: Verita Lamb, CRNA Pre-anesthesia Checklist: Patient identified, Emergency Drugs available, Suction available and Patient being monitored Patient Re-evaluated:Patient Re-evaluated prior to induction Oxygen Delivery Method: Circle system utilized Preoxygenation: Pre-oxygenation with 100% oxygen Induction Type: IV induction Ventilation: Mask ventilation without difficulty Laryngoscope Size: Mac and 3 Grade View: Grade II Tube type: Oral Number of attempts: 1 Airway Equipment and Method: Stylet,  Oral airway and Bougie stylet Placement Confirmation: ETT inserted through vocal cords under direct vision,  positive ETCO2 and breath sounds checked- equal and bilateral Secured at: 22 cm Tube secured with: Tape Dental Injury: Teeth and Oropharynx as per pre-operative assessment

## 2019-05-09 NOTE — H&P (Signed)
Cc: Chronic nasal obstruction  HPI: The patient is a 58 y/o female who presents today for evaluation of chronic nasal congestion.  The patient was told years ago the she had a septal deviation. However she was minimally symptomatic at that time, so observation was recommended. The patient has noted worsening of her nasal congestion the past few years. She has tried multiple steroid nasal sprays with minimal improvement. The patient is having more difficulty breathing at night. She denies a history of recurrent sinus infections. No facial pain or pressure is currently noted. Previous ENT surgery is denied.   The patient's review of systems (constitutional, eyes, ENT, cardiovascular, respiratory, GI, musculoskeletal, skin, neurologic, psychiatric, endocrine, hematologic, allergic) is noted in the ROS questionnaire.  It is reviewed with the patient.   Family health history: Stroke, cancer,dementia, problems with anesthesia, hearing loss.  Major events: C-section, ankle and knee surgery, tonsillectomy.  Ongoing medical problems: Anemia, fatty liver, MVP,.  Social history: The patient is married. She denies the use of tobacco, alcohol or illegal drugs.   Exam: General: Communicates without difficulty, well nourished, no acute distress. Head: Normocephalic, no evidence injury, no tenderness, facial buttresses intact without stepoff. Eyes: PERRL, EOMI. No scleral icterus, conjunctivae clear. Neuro: CN II exam reveals vision grossly intact.  No nystagmus at any point of gaze. Ears: Auricles well formed without lesions.  Ear canals are intact without mass or lesion.  No erythema or edema is appreciated.  The TMs are intact without fluid. Nose: External evaluation reveals normal support and skin without lesions.  Dorsum is intact.  Anterior rhinoscopy reveals congested and edematous mucosa over anterior aspect of the inferior turbinates and nasal septum.  No purulence is noted. Middle meatus is not well  visualized. Oral:  Oral cavity and oropharynx are intact, symmetric, without erythema or edema.  Mucosa is moist without lesions. Neck: Full range of motion without pain.  There is no significant lymphadenopathy.  No masses palpable.  Thyroid bed within normal limits to palpation.  Parotid glands and submandibular glands equal bilaterally without mass.  Trachea is midline. Neuro:  CN 2-12 grossly intact. Gait normal. Vestibular: No nystagmus at any point of gaze.   Procedure:  Flexible Nasal Endoscopy: Risks, benefits, and alternatives of flexible endoscopy were explained to the patient.  Specific mention was made of the risk of throat numbness with difficulty swallowing, possible bleeding from the nose and mouth, and pain from the procedure.  The patient gave oral consent to proceed.  The nasal cavities were decongested and anesthetised with a combination of oxymetazoline and 4% lidocaine solution.  The flexible scope was inserted into the right nasal cavity. NSD. Endoscopy of the inferior and middle meatus was performed.  The edematous mucosa was as described above.  No polyp, mass, or lesion was appreciated.  Olfactory cleft was clear.  Nasopharynx was clear.  Turbinates were hypertrophied but without mass.  Incomplete response to decongestion.  The procedure was repeated on the contralateral side with similar findings.  The patient tolerated the procedure well.  Instructions were given to avoid eating or drinking for 2 hours.   Assessment 1.  Chronic rhinitis, nasal mucosal congestion without evidence of acute sinusitis. No purulent drainage, polyps, or other suspicious mass or lesion is noted on today's nasal endoscopy. 2.  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   Plan  1. The physical exam and nasal endoscopy findings are reviewed with the patient.  2. Treatment options include conservative management  with daily steroid nasal spray versus septoplasty and  bilateral inferior turbinate reduction. The risks, benefits, alternatives, and details of the procedure are reviewed with the patient. Questions are invited and answered. 3. The patient would like to proceed with the septoplasty and turbinate reduction procedures.

## 2019-05-09 NOTE — Discharge Instructions (Addendum)

## 2019-05-09 NOTE — Anesthesia Preprocedure Evaluation (Signed)
Anesthesia Evaluation  Patient identified by MRN, date of birth, ID band Patient awake    Reviewed: Allergy & Precautions, NPO status , Patient's Chart, lab work & pertinent test results  Airway Mallampati: II  TM Distance: >3 FB Neck ROM: Full    Dental no notable dental hx.    Pulmonary neg pulmonary ROS,    Pulmonary exam normal breath sounds clear to auscultation       Cardiovascular negative cardio ROS Normal cardiovascular exam Rhythm:Regular Rate:Normal     Neuro/Psych negative neurological ROS  negative psych ROS   GI/Hepatic negative GI ROS, Neg liver ROS,   Endo/Other  negative endocrine ROS  Renal/GU negative Renal ROS  negative genitourinary   Musculoskeletal negative musculoskeletal ROS (+)   Abdominal   Peds negative pediatric ROS (+)  Hematology negative hematology ROS (+)   Anesthesia Other Findings   Reproductive/Obstetrics negative OB ROS                             Anesthesia Physical Anesthesia Plan  ASA: II  Anesthesia Plan: General   Post-op Pain Management:    Induction: Intravenous  PONV Risk Score and Plan: 3 and Ondansetron, Dexamethasone and Treatment may vary due to age or medical condition  Airway Management Planned: Oral ETT  Additional Equipment:   Intra-op Plan:   Post-operative Plan: Extubation in OR  Informed Consent: I have reviewed the patients History and Physical, chart, labs and discussed the procedure including the risks, benefits and alternatives for the proposed anesthesia with the patient or authorized representative who has indicated his/her understanding and acceptance.     Dental advisory given  Plan Discussed with: CRNA and Surgeon  Anesthesia Plan Comments:         Anesthesia Quick Evaluation

## 2019-05-09 NOTE — Op Note (Signed)
DATE OF PROCEDURE: 05/09/2019  OPERATIVE REPORT   SURGEON: Leta Baptist, MD   PREOPERATIVE DIAGNOSES:  1. Nasal septal deviation.  2. Bilateral inferior turbinate hypertrophy.  3. Chronic nasal obstruction.  POSTOPERATIVE DIAGNOSES:  1. Nasal septal deviation.  2. Bilateral inferior turbinate hypertrophy.  3. Chronic nasal obstruction.  PROCEDURE PERFORMED:  1. Septoplasty.  2. Bilateral partial inferior turbinate resection.   ANESTHESIA: General endotracheal tube anesthesia.   COMPLICATIONS: None.   ESTIMATED BLOOD LOSS: 150 mL.   INDICATION FOR PROCEDURE: BERNA DOCKETT is a 58 y.o. female with a history of chronic nasal obstruction. The patient was  treated with antihistamine, decongestant, and steroid nasal spray. However, the patient continued to be symptomatic. On examination, the patient was noted to have bilateral severe inferior turbinate hypertrophy and significant nasal septal deviation, causing significant nasal obstruction. Based on the above findings, the decision was made for the patient to undergo the above-stated procedures. The risks, benefits, alternatives, and details of the procedures were discussed with the patient. Questions were invited and answered. Informed consent was obtained.   DESCRIPTION OF PROCEDURE: The patient was taken to the operating room and placed supine on the operating table. General endotracheal tube anesthesia was administered by the anesthesiologist. The patient was positioned, and prepped and draped in the standard fashion for nasal surgery. Pledgets soaked with Afrin were placed in both nasal cavities for decongestion. The pledgets were subsequently removed.  Examination of the nasal cavity revealed nasal septal deviation. 1% lidocaine with 1:100,000 epinephrine was injected onto the nasal septum bilaterally. A hemitransfixion incision was made on the left side. The mucosal flap was carefully elevated on the left side. A cartilaginous incision  was made 1 cm superior to the caudal margin of the nasal septum. Mucosal flap was also elevated on the right side in the similar fashion. It should be noted that due to the septal deviation, the deviated portion of the cartilaginous and bony septum had to be removed in piecemeal fashion. Once the deviated portions were removed, a straight midline septum was achieved. The septum was then quilted with 4-0 plain gut sutures. The hemitransfixion incision was closed with interrupted 4-0 chromic sutures.   The inferior one half of both hypertrophied inferior turbinate was crossclamped with a Kelly clamp. The inferior one half of each inferior turbinate was then resected with a pair of cross cutting scissors. Hemostasis was achieved with a suction cautery device. Doyle splints were applied to the nasal septum.  The care of the patient was turned over to the anesthesiologist. The patient was awakened from anesthesia without difficulty. The patient was extubated and transferred to the recovery room in good condition.   OPERATIVE FINDINGS: Nasal septal deviation and bilateral inferior turbinate hypertrophy.   SPECIMEN: None.   FOLLOWUP CARE: The patient be discharged home once she is awake and alert. The patient will be placed on Tylenol p.r.n. pain, and clindamycin for 3 days. The patient will follow up in my office in 2 days for splint removal.   Kethan Papadopoulos Raynelle Bring, MD

## 2019-05-09 NOTE — Anesthesia Postprocedure Evaluation (Signed)
Anesthesia Post Note  Patient: Helen Jackson  Procedure(s) Performed: NASAL SEPTOPLASTY WITH BILATERAL  TURBINATE REDUCTION (Bilateral Nose)     Patient location during evaluation: PACU Anesthesia Type: General Level of consciousness: awake and alert Pain management: pain level controlled Vital Signs Assessment: post-procedure vital signs reviewed and stable Respiratory status: spontaneous breathing, nonlabored ventilation, respiratory function stable and patient connected to nasal cannula oxygen Cardiovascular status: blood pressure returned to baseline and stable Postop Assessment: no apparent nausea or vomiting Anesthetic complications: no    Last Vitals:  Vitals:   05/09/19 1030 05/09/19 1040  BP: (!) 143/85 138/85  Pulse: 67 67  Resp: 12 15  Temp:    SpO2: 97% 97%    Last Pain:  Vitals:   05/09/19 1027  TempSrc:   PainSc: 8                  Kamrynn Melott S

## 2019-05-09 NOTE — Transfer of Care (Signed)
Immediate Anesthesia Transfer of Care Note  Patient: Helen Jackson  Procedure(s) Performed: NASAL SEPTOPLASTY WITH BILATERAL  TURBINATE REDUCTION (Bilateral Nose)  Patient Location: PACU  Anesthesia Type:General  Level of Consciousness: awake, alert  and oriented  Airway & Oxygen Therapy: Patient Spontanous Breathing and Patient connected to face mask oxygen  Post-op Assessment: Report given to RN and Post -op Vital signs reviewed and stable  Post vital signs: Reviewed and stable  Last Vitals:  Vitals Value Taken Time  BP    Temp    Pulse    Resp    SpO2      Last Pain:  Vitals:   05/09/19 0743  TempSrc: Skin  PainSc: 0-No pain      Patients Stated Pain Goal: 4 ([id number])  Complications: No apparent anesthesia complications

## 2019-05-11 ENCOUNTER — Encounter (HOSPITAL_BASED_OUTPATIENT_CLINIC_OR_DEPARTMENT_OTHER): Payer: Self-pay | Admitting: Otolaryngology

## 2019-09-05 ENCOUNTER — Encounter: Payer: Self-pay | Admitting: Certified Nurse Midwife

## 2019-12-06 ENCOUNTER — Other Ambulatory Visit: Payer: Self-pay | Admitting: Internal Medicine

## 2019-12-06 DIAGNOSIS — K760 Fatty (change of) liver, not elsewhere classified: Secondary | ICD-10-CM

## 2019-12-09 ENCOUNTER — Ambulatory Visit
Admission: RE | Admit: 2019-12-09 | Discharge: 2019-12-09 | Disposition: A | Discharge status: Home / Self Care | Payer: Self-pay | Source: Ambulatory Visit | Attending: Internal Medicine | Admitting: Internal Medicine

## 2019-12-09 DIAGNOSIS — K760 Fatty (change of) liver, not elsewhere classified: Secondary | ICD-10-CM

## 2019-12-16 ENCOUNTER — Other Ambulatory Visit: Payer: Self-pay | Admitting: Gastroenterology

## 2020-01-10 ENCOUNTER — Encounter (HOSPITAL_COMMUNITY): Admission: RE | Payer: Self-pay | Source: Home / Self Care

## 2020-01-10 ENCOUNTER — Ambulatory Visit (HOSPITAL_COMMUNITY)
Admission: RE | Admit: 2020-01-10 | Payer: BC Managed Care – PPO | Source: Home / Self Care | Admitting: Gastroenterology

## 2020-01-10 SURGERY — ESOPHAGOGASTRODUODENOSCOPY (EGD) WITH PROPOFOL
Anesthesia: Monitor Anesthesia Care

## 2020-01-10 NOTE — Addendum Note (Signed)
Addended by: Edmonia James on: 01/10/2020 12:46 PM   Modules accepted: Orders

## 2020-02-15 ENCOUNTER — Emergency Department (HOSPITAL_BASED_OUTPATIENT_CLINIC_OR_DEPARTMENT_OTHER): Payer: BC Managed Care – PPO

## 2020-02-15 ENCOUNTER — Encounter (HOSPITAL_COMMUNITY): Payer: BC Managed Care – PPO

## 2020-02-15 ENCOUNTER — Emergency Department (HOSPITAL_COMMUNITY): Payer: BC Managed Care – PPO

## 2020-02-15 ENCOUNTER — Encounter (HOSPITAL_COMMUNITY): Payer: Self-pay | Admitting: Emergency Medicine

## 2020-02-15 ENCOUNTER — Emergency Department (HOSPITAL_COMMUNITY)
Admission: EM | Admit: 2020-02-15 | Discharge: 2020-02-15 | Disposition: A | Discharge status: Home / Self Care | Payer: BC Managed Care – PPO | Attending: Emergency Medicine | Admitting: Emergency Medicine

## 2020-02-15 DIAGNOSIS — R52 Pain, unspecified: Secondary | ICD-10-CM | POA: Diagnosis not present

## 2020-02-15 DIAGNOSIS — U071 COVID-19: Secondary | ICD-10-CM | POA: Insufficient documentation

## 2020-02-15 DIAGNOSIS — R600 Localized edema: Secondary | ICD-10-CM | POA: Insufficient documentation

## 2020-02-15 DIAGNOSIS — R0602 Shortness of breath: Secondary | ICD-10-CM | POA: Diagnosis present

## 2020-02-15 DIAGNOSIS — Z79899 Other long term (current) drug therapy: Secondary | ICD-10-CM | POA: Insufficient documentation

## 2020-02-15 LAB — CBC WITH DIFFERENTIAL/PLATELET
Abs Immature Granulocytes: 0.01 10*3/uL (ref 0.00–0.07)
Basophils Absolute: 0 10*3/uL (ref 0.0–0.1)
Basophils Relative: 0 %
Eosinophils Absolute: 0 10*3/uL (ref 0.0–0.5)
Eosinophils Relative: 0 %
HCT: 46.3 % — ABNORMAL HIGH (ref 36.0–46.0)
Hemoglobin: 15.2 g/dL — ABNORMAL HIGH (ref 12.0–15.0)
Immature Granulocytes: 0 %
Lymphocytes Relative: 34 %
Lymphs Abs: 1.3 10*3/uL (ref 0.7–4.0)
MCH: 33.9 pg (ref 26.0–34.0)
MCHC: 32.8 g/dL (ref 30.0–36.0)
MCV: 103.3 fL — ABNORMAL HIGH (ref 80.0–100.0)
Monocytes Absolute: 0.3 10*3/uL (ref 0.1–1.0)
Monocytes Relative: 9 %
Neutro Abs: 2.2 10*3/uL (ref 1.7–7.7)
Neutrophils Relative %: 57 %
Platelets: 134 10*3/uL — ABNORMAL LOW (ref 150–400)
RBC: 4.48 MIL/uL (ref 3.87–5.11)
RDW: 13.2 % (ref 11.5–15.5)
WBC: 3.8 10*3/uL — ABNORMAL LOW (ref 4.0–10.5)
nRBC: 0 % (ref 0.0–0.2)

## 2020-02-15 LAB — COMPREHENSIVE METABOLIC PANEL
ALT: 41 U/L (ref 0–44)
AST: 54 U/L — ABNORMAL HIGH (ref 15–41)
Albumin: 3.3 g/dL — ABNORMAL LOW (ref 3.5–5.0)
Alkaline Phosphatase: 57 U/L (ref 38–126)
Anion gap: 9 (ref 5–15)
BUN: 6 mg/dL (ref 6–20)
CO2: 26 mmol/L (ref 22–32)
Calcium: 9 mg/dL (ref 8.9–10.3)
Chloride: 104 mmol/L (ref 98–111)
Creatinine, Ser: 0.78 mg/dL (ref 0.44–1.00)
GFR calc Af Amer: >60 mL/min (ref >60)
GFR calc non Af Amer: >60 mL/min (ref >60)
Glucose, Bld: 122 mg/dL — ABNORMAL HIGH (ref 70–99)
Potassium: 4 mmol/L (ref 3.5–5.1)
Sodium: 139 mmol/L (ref 135–145)
Total Bilirubin: 0.9 mg/dL (ref 0.3–1.2)
Total Protein: 7 g/dL (ref 6.5–8.1)

## 2020-02-15 LAB — LACTIC ACID, PLASMA: Lactic Acid, Venous: 1.4 mmol/L (ref 0.5–1.9)

## 2020-02-15 LAB — I-STAT BETA HCG BLOOD, ED (MC, WL, AP ONLY): I-stat hCG, quantitative: <5 m[IU]/mL (ref <5)

## 2020-02-15 MED ORDER — SODIUM CHLORIDE 0.9 % IV BOLUS
500.0000 mL | Freq: Once | INTRAVENOUS | Status: AC
  Administered 2020-02-15: 500 mL via INTRAVENOUS

## 2020-02-15 MED ORDER — SODIUM CHLORIDE 0.9 % IV BOLUS: 250.0000 mL | Freq: Once | INTRAVENOUS | Status: DC

## 2020-02-15 NOTE — ED Provider Notes (Signed)
[facility] EMERGENCY DEPARTMENT Provider Note   CSN: [id number] Arrival date & time: 02/15/20  0945     History Chief Complaint  Patient presents with  . Pneumonia    covid positive    EVALIN Helen Jackson is a 59 y.o. female who presents today for evaluation of shortness of breath in the setting of Covid infection.  She is on day 6 of symptoms.  She has been seen by her PCP, on Monday was started on azithromycin after she had a chest x-ray showing bilateral pneumonia.  She has been taking the azithromycin, however reports today she was feeling bad, checked her oxygen at home and it was in the high 80s causing her to come in.  She reports nausea without vomiting.  She is not vaccinated against Covid.  She has coughed to the point that she vomited once.  She has a constant dry cough.  She has been taking Tylenol without significant relief in her symptoms.  She also reports that every night she has been having pain and cramping in her bilateral calfs that will wake her up from her sleep.  She had something similar before and was told by her doctor that if it happened again they would order a DVT study.  She does not have a history of PE/DVT.  HPI     Past Medical History:  Diagnosis Date  . Arthritis   . Celiac disease   . Complication of anesthesia    difficulty waking up  . Elevated alkaline phosphatase level   . Fibroid   . Heart murmur    Dr. Kelly,cardiolgy follows  . MVP (mitral valve prolapse)   . Ocular rosacea   . Peripheral neuropathy   . Pernicious anemia    injections every 4 to 6 weeks -last injestion 09-15-14  . Seizures ([place])    see notes in anesthesia  . Upper airway resistance syndrome    After sleep study  . Upper respiratory disease    upper respiratory airway disease    Patient Active Problem List   Diagnosis Date Noted  . Cervical spondylosis with radiculopathy 12/07/2017  . Chronic pain of left ankle 10/02/2017  . Myopic  astigmatism of both eyes 11/14/2015  . Word finding difficulty 09/10/2015  . Cognitive change 09/10/2015  . Insulin resistance 05/31/2015  . Nonalcoholic fatty liver disease 05/31/2015  . UARS (upper airway resistance syndrome) 07/14/2013  . Mitral valve prolapse 06/02/2013  . Pernicious anemia 06/02/2013  . Obesity, Class II, BMI 35-39.9, with comorbidity 06/02/2013  . Vitamin D insufficiency 06/02/2013  . Obstructive sleep apnea 06/02/2013  . Palpitations 06/02/2013  . Family history of heart disease 06/02/2013  . Blepharitis with rosacea 06/02/2013  . Low back pain 06/01/2013  . Chalazion of right upper eyelid 12/29/2012  . Posterior tibial tendinitis 10/09/2011    Past Surgical History:  Procedure Laterality Date  . ANKLE SURGERY    . CESAREAN SECTION    . COLONOSCOPY WITH PROPOFOL N/A 11/28/2014   Procedure: COLONOSCOPY WITH PROPOFOL;  Surgeon: Juanita Craver, MD;  Location: [facility];  Service: Endoscopy;  Laterality: N/A;  . ESOPHAGOGASTRODUODENOSCOPY (EGD) WITH PROPOFOL N/A 11/28/2014   Procedure: ESOPHAGOGASTRODUODENOSCOPY (EGD) WITH PROPOFOL;  Surgeon: Juanita Craver, MD;  Location: [facility];  Service: Endoscopy;  Laterality: N/A;  . INCISION AND DRAINAGE     bartholin cyst  . KNEE SURGERY     bilateral  . NASAL SEPTOPLASTY W/ TURBINOPLASTY Bilateral 05/09/2019   Procedure: NASAL SEPTOPLASTY WITH  BILATERAL  TURBINATE REDUCTION;  Surgeon: Leta Baptist, MD;  Location: [facility];  Service: ENT;  Laterality: Bilateral;  . NECK SURGERY  2019   Cervical  . TONSILLECTOMY       OB History    Gravida  1   Para  1   Term  1   Preterm  0   AB  0   Living  1     SAB  0   TAB  0   Ectopic  0   Multiple  0   Live Births  1           Family History  Problem Relation Age of Onset  . Osteoporosis Mother   . CVA Mother   . Dementia Mother   . Stroke Mother   . Cancer Father        pancreatic  . Breast cancer Paternal Aunt   . Chorea  Maternal Grandmother     Social History   Tobacco Use  . Smoking status: Never Smoker  . Smokeless tobacco: Never Used  Vaping Use  . Vaping Use: Never used  Substance Use Topics  . Alcohol use: No    Alcohol/week: 0.0 standard drinks  . Drug use: No    Home Medications Prior to Admission medications   Medication Sig Start Date End Date Taking? Authorizing Provider  Cholecalciferol (VITAMIN D3) 1.25 MG (50000 UT) CAPS Take 50,000 Units by mouth every Sunday.  11/20/18   [provider]  Cyanocobalamin (B-12) 1000 MCG/ML KIT Inject 1,000 mcg as directed See admin instructions. Every 4-6 weeks     [provider]  EPIPEN 2-PAK 0.3 MG/0.3ML SOAJ injection Inject 0.3 mg as directed as needed (for allergies/never used). Reported on 08/30/2015 01/20/14   [provider]  linaclotide (LINZESS) 290 MCG CAPS capsule Take 290 mcg by mouth daily before breakfast.     [provider]  metroNIDAZOLE (METROGEL) 1 % gel Apply 1 application topically at bedtime as needed (irritation).     [provider]    Allergies    Gentamicin, Influenza vaccines, Other, Almond oil, Amoxicillin, Doxycycline, Eggs or egg-derived products, Hydrocodone-acetaminophen, Ibuprofen, Indomethacin, Morphine, Pyridium [phenazopyridine hcl], Soybean-containing drug products, Soybeans [soybean oil], Tetracyclines & related, Tramadol, Wheat bran, and Erythromycin base  Review of Systems   Review of Systems  Constitutional: Positive for chills, fatigue and fever.  Eyes: Negative for visual disturbance.  Respiratory: Positive for cough, chest tightness and shortness of breath.   Cardiovascular: Positive for leg swelling. Negative for chest pain.  Gastrointestinal: Positive for nausea. Negative for abdominal pain, diarrhea and vomiting.  Musculoskeletal: Negative for back pain and neck pain.  Skin: Negative for color change and rash.  Neurological: Positive for headaches. Negative for  weakness and numbness.  All other systems reviewed and are negative.   Physical Exam Updated Vital Signs BP 123/66   Pulse 91   Temp 99.8 F (37.7 C) (Oral)   Resp (!) 36   Ht _0  (1.6 m)   Wt 86.2 kg   LMP 07/17/2012   SpO2 98%   BMI 33.66 kg/m   Physical Exam Vitals and nursing note reviewed.  Constitutional:      General: She is not in acute distress.    Appearance: She is well-developed. She is not ill-appearing or diaphoretic.  HENT:     Head: Normocephalic and atraumatic.  Eyes:     General: No scleral icterus.       Right  eye: No discharge.        Left eye: No discharge.     Conjunctiva/sclera: Conjunctivae normal.  Cardiovascular:     Rate and Rhythm: Normal rate and regular rhythm.     Pulses: Normal pulses.  Pulmonary:     Effort: Pulmonary effort is normal. No respiratory distress.     Breath sounds: No stridor.     Comments: Constant dry cough. Mild increased work of breathing.  Abdominal:     General: There is no distension.  Musculoskeletal:        General: No deformity.     Cervical back: Normal range of motion and neck supple.     Right lower leg: Edema present.     Left lower leg: Edema present.     Comments: Right calf greater than left calf, both are very TTP.   Skin:    General: Skin is warm and dry.  Neurological:     General: No focal deficit present.     Mental Status: She is alert and oriented to person, place, and time.     Motor: No abnormal muscle tone.     Comments: Awake and alert, answers all questions appropriately.  Speech is not slurred.  Psychiatric:        Mood and Affect: Mood normal.        Behavior: Behavior normal.     ED Results / Procedures / Treatments   Labs (all labs ordered are listed, but only abnormal results are displayed) Labs Reviewed  COMPREHENSIVE METABOLIC PANEL - Abnormal; Notable for the following components:      Result Value   Glucose, Bld 122 (*)    Albumin 3.3 (*)    AST 54 (*)    All  other components within normal limits  CBC WITH DIFFERENTIAL/PLATELET - Abnormal; Notable for the following components:   WBC 3.8 (*)    Hemoglobin 15.2 (*)    HCT 46.3 (*)    MCV 103.3 (*)    Platelets 134 (*)    All other components within normal limits  LACTIC ACID, PLASMA  I-STAT BETA HCG BLOOD, ED ([facility], [facility], [facility] ONLY)    EKG EKG Interpretation  Date/Time:  Wednesday February 15 2020 09:56:59 EDT Ventricular Rate:  98 PR Interval:  144 QRS Duration: 80 QT Interval:  320 QTC Calculation: 408 R Axis:   57 Text Interpretation: Normal sinus rhythm nonspecific ST segments No old tracing to compare Confirmed by Sherwood Gambler [phone] on 02/15/2020 3:11:34 PM   Radiology DG Chest Portable 1 View  Result Date: 02/15/2020 CLINICAL DATA:  Shortness of breath.  COVID-19 positive. EXAM: PORTABLE CHEST 1 VIEW COMPARISON:  None. FINDINGS: The heart size and mediastinal contours are within normal limits. Right lung is clear. Minimal left basilar subsegmental atelectasis or infiltrate is noted. No pneumothorax or pleural effusion is noted. The visualized skeletal structures are unremarkable. IMPRESSION: Minimal left basilar subsegmental atelectasis or infiltrate is noted. Electronically Signed   By: Marijo Conception M.D.   On: 02/15/2020 10:28   VAS [place] LOWER EXTREMITY VENOUS (DVT) ([facility] 7a-7p)  Result Date: 02/15/2020  Lower Venous DVTStudy Indications: Pain in calves bilaterally.  Comparison Study: No prior studies. Performing Technologist: Darlin Coco  Examination Guidelines: A complete evaluation includes B-mode imaging, spectral Doppler, color Doppler, and power Doppler as needed of all accessible portions of each vessel. Bilateral testing is considered an integral part of a complete examination. Limited examinations for reoccurring indications may be performed  as noted. The reflux portion of the exam is performed with the patient in reverse Trendelenburg.   +---------+---------------+---------+-----------+----------+--------------+ RIGHT    CompressibilityPhasicitySpontaneityPropertiesThrombus Aging +---------+---------------+---------+-----------+----------+--------------+ CFV      Full           Yes      Yes                                 +---------+---------------+---------+-----------+----------+--------------+ SFJ      Full                                                        +---------+---------------+---------+-----------+----------+--------------+ FV Prox  Full                                                        +---------+---------------+---------+-----------+----------+--------------+ FV Mid   Full                                                        +---------+---------------+---------+-----------+----------+--------------+ FV DistalFull                                                        +---------+---------------+---------+-----------+----------+--------------+ PFV      Full                                                        +---------+---------------+---------+-----------+----------+--------------+ POP      Full           Yes      Yes                                 +---------+---------------+---------+-----------+----------+--------------+ PTV      Full                                                        +---------+---------------+---------+-----------+----------+--------------+ PERO     Full                                                        +---------+---------------+---------+-----------+----------+--------------+ Gastroc  Full                                                        +---------+---------------+---------+-----------+----------+--------------+   +---------+---------------+---------+-----------+----------+--------------+  LEFT     CompressibilityPhasicitySpontaneityPropertiesThrombus Aging  +---------+---------------+---------+-----------+----------+--------------+ CFV      Full           Yes      Yes                                 +---------+---------------+---------+-----------+----------+--------------+ SFJ      Full                                                        +---------+---------------+---------+-----------+----------+--------------+ FV Prox  Full                                                        +---------+---------------+---------+-----------+----------+--------------+ FV Mid   Full                                                        +---------+---------------+---------+-----------+----------+--------------+ FV DistalFull                                                        +---------+---------------+---------+-----------+----------+--------------+ PFV      Full                                                        +---------+---------------+---------+-----------+----------+--------------+ POP      Full           Yes      Yes                                 +---------+---------------+---------+-----------+----------+--------------+ PTV      Full                                                        +---------+---------------+---------+-----------+----------+--------------+ PERO     Full                                                        +---------+---------------+---------+-----------+----------+--------------+ Gastroc  Full                                                        +---------+---------------+---------+-----------+----------+--------------+  Summary: RIGHT: - There is no evidence of deep vein thrombosis in the lower extremity.  - No cystic structure found in the popliteal fossa.  LEFT: - There is no evidence of deep vein thrombosis in the lower extremity.  - No cystic structure found in the popliteal fossa.  *See table(s) above for measurements and observations.    Preliminary      Procedures Procedures (including critical care time)  Medications Ordered in ED Medications  sodium chloride 0.9 % bolus 500 mL (500 mLs Intravenous New Bag/Given 02/15/20 1651)    ED Course  I have reviewed the triage vital signs and the nursing notes.  Pertinent labs & imaging results that were available during my care of the patient were reviewed by me and considered in my medical decision making (see chart for details).  Clinical Course as of Feb 15 1835  Wed Feb 15, 2020  1522 I ambulated patient in room.  She got light headed and had to sit down.  She dropped to 93% on room air with increased work of breathing.     [EH]  1647 Patient is negative for DVT.  Still waiting fluids completion, then will re-evaluate and disposition.    [EH]  1751 Patient is reevaluated.  She is getting IV fluids.  She does require extra time when changing positions to not feel dizzy, however she is able to slowly stand and walk in the room, her oxygen stays about 96%, and when I first walked in on room air she was at 100%.  Plan to discharge.   [EH]    Clinical Course User Index [EH] Ollen Gross   MDM Rules/Calculators/A&P                          Patient is a 59 year old woman who presents today for evaluation of shortness of breath and Covid.  She is on day 6 of symptoms.  CBC shows expected neutropenia 3.8.  Hemoglobin elevated pain for dehydration.  CMP is normal.   When I ambulated her in the room she dropped to 93%.  She has obvious asymmetric edema of her bilateral lower extremities with bilateral calf pain.  Chest x-ray does not show pneumothorax, is consistent with Covid.  Plan for small fluid bolus, DVT study and reevaluate/have her ambulate in the room.  DVT study shows no evidence of DVT.  Instructed on outpatient follow up.   After fluids patient was able to stand.  Ambulate and was not hypoxic.   Given that patient is not hypoxic, does not meet admission criteria.   Her orthostatic vitals did not show significant hypotension however she was symptomatic, this improved after IV fluids.  Patient does not meet criteria for MAB infusion at this time, (BMI is under 35, no CKD, HTN, or DM, immunosuppression).    Return precautions were discussed with patient who states their understanding.  At the time of discharge patient denied any unaddressed complaints or concerns.  Patient is agreeable for discharge home.  Note: Portions of this report may have been transcribed using voice recognition software. Every effort was made to ensure accuracy; however, inadvertent computerized transcription errors may be present   TANE BIEGLER was evaluated in Emergency Department on 02/15/2020 for the symptoms described in the history of present illness. She was evaluated in the context of the global COVID-19 pandemic, which necessitated consideration that the patient might be at risk for infection with the SARS-CoV-2 virus  that causes COVID-19. Institutional protocols and algorithms that pertain to the evaluation of patients at risk for COVID-19 are in a state of rapid change based on information released by regulatory bodies including the CDC and federal and state organizations. These policies and algorithms were followed during the patient's care in the ED.  Final Clinical Impression(s) / ED Diagnoses Final diagnoses:  [id number]    Rx / DC Orders ED Discharge Orders    None       Ollen Gross 02/15/20 Donney Dice, MD 02/16/20 [phone]

## 2020-02-15 NOTE — ED Triage Notes (Signed)
Pt reports tested for covid sat and was positive, on Monday seen at pcp who did cxray and diagnosed her with bilateral pneumonia, pt currently on azithromycin, but reports this morning she felt much more sob and had O2 sat at home in the high 80s, called pcp who advised her to come to ED. resp mildly labored, O2 sats 97% in triage.

## 2020-02-15 NOTE — ED Notes (Signed)
Reviewed discharge instructions with patient. Follow-up care and COVID s/s care reviewed. Patient verbalized understanding. Patient A&Ox4, and VSS upon discharge.

## 2020-02-15 NOTE — ED Notes (Signed)
Vascular at bedside

## 2020-02-15 NOTE — ED Notes (Signed)
RN completed orthostatic vital signs. Pt reported feeling lightheaded, dizzy, weak and sob w/ standing, asked to sit down right after BP was taken. RR also increased to 30s w/ standing.

## 2020-02-15 NOTE — Discharge Instructions (Addendum)
Please take Tylenol (acetaminophen) to relieve your pain.  You may take tylenol, up to 1,000 mg (two extra strength pills).  Do not take more than 3,000 mg tylenol in a 24 hour period.  Please check all medication labels as many medications such as pain and cold medications may contain tylenol. Please do not drink alcohol while taking this medication.   If you get worse, your oxygen is constantly in the 80s or you have other concerns please return to the emergency room. At this time you do not appear to require admission for your covid, however things can change so please continue to monitor your self closely.

## 2020-02-15 NOTE — Progress Notes (Signed)
Lower extremity venous bilateral study completed  Preliminary results relayed to Regenia Skeeter, MD.   See CV Proc for preliminary results report.   Helen Jackson

## 2020-03-07 ENCOUNTER — Other Ambulatory Visit: Payer: Self-pay | Admitting: General Practice

## 2020-03-07 ENCOUNTER — Ambulatory Visit (HOSPITAL_COMMUNITY)
Admission: RE | Admit: 2020-03-07 | Discharge: 2020-03-07 | Disposition: A | Discharge status: Home / Self Care | Payer: BC Managed Care – PPO | Source: Ambulatory Visit | Attending: Internal Medicine | Admitting: Internal Medicine

## 2020-03-07 ENCOUNTER — Other Ambulatory Visit (HOSPITAL_COMMUNITY): Payer: Self-pay | Admitting: Internal Medicine

## 2020-03-07 ENCOUNTER — Other Ambulatory Visit: Payer: Self-pay

## 2020-03-07 ENCOUNTER — Other Ambulatory Visit (HOSPITAL_COMMUNITY): Payer: Self-pay | Admitting: General Practice

## 2020-03-07 DIAGNOSIS — R0602 Shortness of breath: Secondary | ICD-10-CM

## 2020-03-07 MED ORDER — IOHEXOL 350 MG/ML SOLN
75.0000 mL | Freq: Once | INTRAVENOUS | Status: AC | PRN
  Administered 2020-03-07: 75 mL via INTRAVENOUS

## 2020-11-09 ENCOUNTER — Ambulatory Visit: Payer: BC Managed Care – PPO | Admitting: Family

## 2020-11-09 ENCOUNTER — Other Ambulatory Visit: Payer: Self-pay

## 2020-11-09 ENCOUNTER — Encounter: Payer: Self-pay | Admitting: Family

## 2020-11-09 VITALS — BP 128/78 | HR 70 | Resp 18 | Ht 63.0 in | Wt 172.8 lb

## 2020-11-09 DIAGNOSIS — R5383 Other fatigue: Secondary | ICD-10-CM | POA: Diagnosis not present

## 2020-11-09 DIAGNOSIS — I2510 Atherosclerotic heart disease of native coronary artery without angina pectoris: Secondary | ICD-10-CM

## 2020-11-09 DIAGNOSIS — Z8616 Personal history of COVID-19: Secondary | ICD-10-CM

## 2020-11-09 DIAGNOSIS — I341 Nonrheumatic mitral (valve) prolapse: Secondary | ICD-10-CM

## 2020-11-09 DIAGNOSIS — R0789 Other chest pain: Secondary | ICD-10-CM

## 2020-11-09 DIAGNOSIS — R072 Precordial pain: Secondary | ICD-10-CM

## 2020-11-09 DIAGNOSIS — R0609 Other forms of dyspnea: Secondary | ICD-10-CM

## 2020-11-09 DIAGNOSIS — E785 Hyperlipidemia, unspecified: Secondary | ICD-10-CM

## 2020-11-09 DIAGNOSIS — R0600 Dyspnea, unspecified: Secondary | ICD-10-CM

## 2020-11-09 MED ORDER — METOPROLOL TARTRATE 100 MG PO TABS: ORAL_TABLET | ORAL | 0 refills | Status: DC

## 2020-11-09 NOTE — Patient Instructions (Addendum)
Medication Instructions:  Continue your current medications.   *If you need a refill on your cardiac medications before your next appointment, please call your pharmacy*  Lab Work: None ordered today.  For fatigue we normally check TSH (thyroid), CBC (blood counts to check for anemia), and vitamin levels like B12 and vitamin D.   Testing/Procedures: Your EKG today shows normal sinus rhythm. This is a good result!  Your provider has requested that you have an echocardiogram. Echocardiography is a painless test that uses sound waves to create images of your heart. It provides your doctor with information about the size and shape of your heart and how well your heart's chambers and valves are working. This procedure takes approximately one hour. There are no restrictions for this procedure.  Your provider has recommended a cardiac CTA.  Your cardiac CT will be scheduled at:  [facility] [address] [place], [place] [address] [phone]  If scheduled at [facility], please arrive at the [facility] main entrance (entrance A) of [facility] 30 minutes prior to test start time. Proceed to the [facility] Radiology Department (first floor) to check-in and test prep.  Please follow these instructions carefully (unless otherwise directed):  On the Night Before the Test: . Be sure to Drink plenty of water. . Do not consume any caffeinated/decaffeinated beverages or chocolate 12 hours prior to your test. . Do not take any antihistamines 12 hours prior to your test.  On the Day of the Test: . Drink plenty of water until 1 hour prior to the test. . Do not eat any food 4 hours prior to the test. . You may take your regular medications prior to the test.  . Take metoprolol (Lopressor) two hours prior to test. . HOLD Furosemide/Hydrochlorothiazide morning of the test. . FEMALES- please wear underwire-free bra if available Heart Rate Medication  Recommendations for Cardiac CT  . Please take o Metoprolol tartrate 100 mg PO 90-120 minutes prior to scan       After the Test: . Drink plenty of water. . After receiving IV contrast, you may experience a mild flushed feeling. This is normal. . On occasion, you may experience a mild rash up to 24 hours after the test. This is not dangerous. If this occurs, you can take Benadryl 25 mg and increase your fluid intake. . If you experience trouble breathing, this can be serious. If it is severe call 911 IMMEDIATELY. If it is mild, please call our office. . If you take any of these medications: Glipizide/Metformin, Avandament, Glucavance, please do not take 48 hours after completing test unless otherwise instructed.   Once we have confirmed authorization from your insurance company, we will call you to set up a date and time for your test. Based on how quickly your insurance processes prior authorizations requests, please allow up to 4 weeks to be contacted for scheduling your Cardiac CT appointment. Be advised that routine Cardiac CT appointments could be scheduled as many as 8 weeks after your provider has ordered it.  For non-scheduling related questions, please contact the cardiac imaging nurse navigator should you have any questions/concerns: Marchia Bond, Cardiac Imaging Nurse Navigator Gordy Clement, Cardiac Imaging Nurse Navigator [facility] Direct Office Dial: [phone]   For scheduling needs, including cancellations and rescheduling, please call [place], [phone].   Follow-Up: At [facility], you and your health needs are our priority.  As part of our continuing mission to  provide you with exceptional heart care, we have created designated Provider Care Teams.  These Care Teams include your primary Cardiologist (physician) and Advanced Practice Providers (APPs -  Physician Assistants and Nurse Practitioners) who all work together to provide you  with the care you need, when you need it.  We recommend signing up for the patient portal called "MyChart".  Sign up information is provided on this After Visit Summary.  MyChart is used to connect with patients for Virtual Visits (Telemedicine).  Patients are able to view lab/test results, encounter notes, upcoming appointments, etc.  Non-urgent messages can be sent to your provider as well.   To learn more about what you can do with MyChart, go to [email or web address].    Your next appointment:   2-3 month(s)  The format for your next appointment:   In Person  Provider:   You may see [name], MD or one of the following Advanced Practice Providers on your designated Care Team:    [name], PA-C  Fabian Sharp, PA-C or   Roby Lofts, [place]  Other Instructions  Heart Healthy Diet Recommendations: A low-salt diet is recommended. Meats should be grilled, baked, or boiled. Avoid fried foods. Focus on lean protein sources like fish or chicken with vegetables and fruits. The American Heart Association is a Microbiologist!  American Heart Association Diet and Lifeystyle Recommendations   Exercise recommendations: The American Heart Association recommends 150 minutes of moderate intensity exercise weekly. Try 30 minutes of moderate intensity exercise 4-5 times per week. This could include walking, jogging, or swimming.

## 2020-11-09 NOTE — Progress Notes (Signed)
Office Visit    Patient Name: Helen Jackson Date of Encounter: 11/09/2020  PCP:  [name], [place]  Cardiologist:  [name], MD  Advanced Practice Provider:  No care team member to display Electrophysiologist:  None   Chief Complaint    Helen Jackson is a 60 y.o. female with a hx of mitral valve prolapse, cervical disc disease with surgical repair, nonalcoholic fatty liver disease, coronary artery calcification on CT, COVID19 pneumonia not requiring hospitalization, sicca syndrome, auto-immune diathesis, obesity presents today for dyspnea, chest tightness.   Past Medical History    Past Medical History:  Diagnosis Date  . Arthritis   . Celiac disease   . Complication of anesthesia    difficulty waking up  . Elevated alkaline phosphatase level   . Fibroid   . Heart murmur    Dr. Kelly,cardiolgy follows  . MVP (mitral valve prolapse)   . Ocular rosacea   . Peripheral neuropathy   . Pernicious anemia    injections every 4 to 6 weeks -last injestion 09-15-14  . Seizures ([place])    see notes in anesthesia  . Upper airway resistance syndrome    After sleep study  . Upper respiratory disease    upper respiratory airway disease   Past Surgical History:  Procedure Laterality Date  . ANKLE SURGERY    . CESAREAN SECTION    . COLONOSCOPY WITH PROPOFOL N/A 11/28/2014   Procedure: COLONOSCOPY WITH PROPOFOL;  Surgeon: Juanita Craver, MD;  Location: [facility];  Service: Endoscopy;  Laterality: N/A;  . ESOPHAGOGASTRODUODENOSCOPY (EGD) WITH PROPOFOL N/A 11/28/2014   Procedure: ESOPHAGOGASTRODUODENOSCOPY (EGD) WITH PROPOFOL;  Surgeon: Juanita Craver, MD;  Location: [facility];  Service: Endoscopy;  Laterality: N/A;  . INCISION AND DRAINAGE     bartholin cyst  . KNEE SURGERY     bilateral  . NASAL SEPTOPLASTY W/ TURBINOPLASTY Bilateral 05/09/2019   Procedure: NASAL SEPTOPLASTY WITH BILATERAL  TURBINATE REDUCTION;  Surgeon: Leta Baptist, MD;   Location: [place] [facility];  Service: ENT;  Laterality: Bilateral;  . NECK SURGERY  2019   Cervical  . TONSILLECTOMY      Allergies  Allergies  Allergen Reactions  . Gentamicin Anaphylaxis  . Influenza Vaccines Anaphylaxis  . Other Anaphylaxis    Almond-  . Almond Oil     almonds  . Amoxicillin Hives  . Doxycycline     Cant remember  . Eggs Or Egg-Derived Products Nausea And Vomiting    11-20-14 Pt. States "had anaphylaxis with flu vaccine shot with egg based"  . Hydrocodone-Acetaminophen Nausea And Vomiting    VIOLENTLY SHAKES  . Ibuprofen Itching  . Indomethacin Other (See Comments)    Stomach pain & black stool  . Morphine Nausea And Vomiting and Other (See Comments)    VIOLENTLY SHAKES  . Pyridium [Phenazopyridine Hcl]     Cant remember  . Soybean-Containing Drug Products Other (See Comments)    Swelling of the tongue  . Soybeans [Soybean Oil]   . Tetracyclines & Related Hives    swelling  . Tramadol Itching    severe  . Wheat Bran     Cant remember  . Erythromycin Base Rash    History of Present Illness    Helen Jackson is a 60 y.o. female with a hx of mitral valve prolapse, cervical disc disease with surgical repair, nonalcoholic fatty liver disease, coronary artery calcification on CT, COVID19 pneumonia not requiring hospitalization, sicca  syndrome, auto-immune diathesis, obesity. She was last seen 11/2017 by Dr. Claiborne Billings. She has a strong family history of premature cardiovascular disease with mother suffering stroke in her 3s and maternal grandmother and mother having MI in 11s and 54s.  She has followed intermittenlty with Dr. Claiborne Billings 2014, 12/2012, and 11/2017. Previous treadmill test negative for ischemia. Echo 05/2013 LVEF 66-4%, systolic bowing of mitral valve without definitive prolapse, trivial MR, normal diastolic parameters. Previous sleep study with increased upper airway resistance without definitive sleep apnea.  She was last seen 11/2017 in  anticipation of neck surgery with no cardiovascular complaints.   She presents today for follow up. 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 Since last seen she has established with GI specialist related to elevated liver enzymes and diagnosed for nonalcoholic fatty liver disease.   She had COVID pneumonia September 2021 and did not require hospitalization. Tells me she was out of work and ill for 5 weeks. CT at that time with two vessel coronary artery calcification. She had thorough workup with GI due to colitis, diverticulosis, proctatitis.   She has lost nearly 50 pounds by cutting out processed sugars. Notes persistent fatigue since her diagnosis of COVID. She can not lay on her right side as she has difficulty breathing. When she lays on her back she feels heaviness on her chest. Reports very intermittent palpitations which she associates with her mitral valve prolapse. Symptoms of chest heaviness are gradually worsening.   EKGs/Labs/Other Studies Reviewed:   The following studies were reviewed today:  EKG:  EKG is ordered today.  The ekg ordered today demonstrates NSR 70 bpm with no acute ST/T wave changes.   Recent Labs: 02/15/2020: ALT 41; BUN 6; Creatinine, Ser 0.78; Hemoglobin 15.2; Platelets 134; Potassium 4.0; Sodium 139  Recent Lipid Panel    Component Value Date/Time   CHOL 156 01/08/2017 0906   TRIG 80 01/08/2017 0906   HDL 52 01/08/2017 0906   CHOLHDL 3.0 01/08/2017 0906   CHOLHDL 3.7 10/11/2013 0819   VLDL 18 10/11/2013 0819   LDLCALC 88 01/08/2017 0906   Home Medications   Current Meds  Medication Sig  . Cholecalciferol (VITAMIN D3) 1.25 MG (50000 UT) CAPS Take 50,000 Units by mouth every Sunday.   . Cyanocobalamin (B-12) 1000 MCG/ML KIT Inject 1,000 mcg as directed See admin instructions. Every 4-6 weeks  . EPIPEN 2-PAK 0.3 MG/0.3ML SOAJ injection Inject 0.3 mg as directed as needed (for allergies/never used). Reported on  08/30/2015  . metroNIDAZOLE (METROGEL) 1 % gel Apply 1 application topically at bedtime as needed (irritation).      Review of Systems  All other systems reviewed and are otherwise negative except as noted above.  Physical Exam    VS:  BP 128/78 (BP Location: Left Arm, Patient Position: Sitting, Cuff Size: Normal)   Pulse 70   Resp 18   Ht 5' 3"  (1.6 m)   Wt 172 lb 12.8 oz (78.4 kg)   LMP 07/17/2012   SpO2 96%   BMI 30.61 kg/m  , BMI Body mass index is 30.61 kg/m.  Wt Readings from Last 3 Encounters:  11/09/20 172 lb 12.8 oz (78.4 kg)  02/15/20 190 lb (86.2 kg)  05/09/19 213 lb 13.5 oz (97 kg)    GEN: Well nourished, well developed, in no acute distress. HEENT: normal. Neck: Supple, no JVD, carotid bruits, or masses. Cardiac: RRR, no murmurs, rubs, or gallops. No clubbing, cyanosis, edema.  Radials/DP/PT  2+ and equal bilaterally.  Respiratory:  Respirations regular and unlabored, clear to auscultation bilaterally. GI: Soft, nontender, nondistended. MS: No deformity or atrophy. Skin: Warm and dry, no rash. Neuro:  Strength and sensation are intact. Psych: Normal affect.  Assessment & Plan    1. Mitral valve prolapse - Update echo for monitoring. No murmur appreciated on exam.  2. Morbid obesity - Congratulated on weight loss. Weight loss via diet and exercise encouraged. Discussed the impact being overweight would have on cardiovascular risk.  3. Family history of heart disease / Chest pain / Dyspnea / Coronary artery calcification on CT / History of COVID19- Notes chest heaviness and persistent dyspnea. Unclear whether this is an anginal equivalent or residual symptoms from COVID 19 pneumonia. CT chest 02/2020 with 2 vessel coronary artery calcification. EKG today with no acute ST/T wave changes. Strong family history of premature cardiovascular disease. She is not presently on optimized GDMT. Recommend cardiac CTA for evaluation of chest pain, CAD. Plan for optimization of  GDMT following study. Update echo for evaluation of ejection fraction and valvular function. If workup unrevealing may benefit from future referral to pulmonology.   4. Nonalcoholic fatty liver disease - Continue to follow with GI. Avoid acetaminophen, fried foods, alcohol. Will need to consider carefully when managing hyperlipidemia with careful monitoring of liver enzymes.   5. HLD - LDL goal <70 in setting of coronary artery calcification. Upcoming lipid panel with primary care. Will allow these labs to guide therapy choice. Agent choice complicated by liver disease. May need to consider Zetia or PCSK9i.   Disposition: Follow up in 2 month(s) with Dr. Claiborne Billings or APP.    Signed, Loel Dubonnet, NP 11/09/2020, 3:54 PM [place]

## 2020-11-10 ENCOUNTER — Encounter: Payer: Self-pay | Admitting: Family

## 2020-11-14 ENCOUNTER — Telehealth (HOSPITAL_COMMUNITY): Payer: Self-pay | Admitting: *Deleted

## 2020-11-14 NOTE — Telephone Encounter (Signed)
Reaching out to patient to offer assistance regarding upcoming cardiac imaging study; pt verbalizes understanding of appt date/time, parking situation and where to check in, pre-test NPO status and medications ordered, and verified current allergies; name and call back number provided for further questions should they arise  Gordy Clement RN Navigator Cardiac Imaging [facility] [phone] office [phone] cell  Pt to take 100mg  metoprolol tartrate 2 hours prior to CT scan.

## 2020-11-15 ENCOUNTER — Ambulatory Visit (HOSPITAL_COMMUNITY)
Admission: RE | Admit: 2020-11-15 | Discharge: 2020-11-15 | Disposition: A | Discharge status: Home / Self Care | Payer: BC Managed Care – PPO | Source: Ambulatory Visit | Attending: Family | Admitting: Family

## 2020-11-15 ENCOUNTER — Other Ambulatory Visit: Payer: Self-pay

## 2020-11-15 DIAGNOSIS — R0600 Dyspnea, unspecified: Secondary | ICD-10-CM | POA: Diagnosis not present

## 2020-11-15 DIAGNOSIS — Z8616 Personal history of COVID-19: Secondary | ICD-10-CM | POA: Diagnosis not present

## 2020-11-15 DIAGNOSIS — I2510 Atherosclerotic heart disease of native coronary artery without angina pectoris: Secondary | ICD-10-CM | POA: Diagnosis not present

## 2020-11-15 DIAGNOSIS — R0789 Other chest pain: Secondary | ICD-10-CM | POA: Insufficient documentation

## 2020-11-15 DIAGNOSIS — R5383 Other fatigue: Secondary | ICD-10-CM | POA: Diagnosis present

## 2020-11-15 DIAGNOSIS — R0609 Other forms of dyspnea: Secondary | ICD-10-CM

## 2020-11-15 DIAGNOSIS — I700 Atherosclerosis of aorta: Secondary | ICD-10-CM | POA: Insufficient documentation

## 2020-11-15 MED ORDER — NITROGLYCERIN 0.4 MG SL SUBL
0.8000 mg | SUBLINGUAL_TABLET | Freq: Once | SUBLINGUAL | Status: AC
  Administered 2020-11-15: 0.8 mg via SUBLINGUAL

## 2020-11-15 MED ORDER — IOHEXOL 350 MG/ML SOLN
95.0000 mL | Freq: Once | INTRAVENOUS | Status: AC | PRN
  Administered 2020-11-15: 95 mL via INTRAVENOUS

## 2020-11-15 MED ORDER — NITROGLYCERIN 0.4 MG SL SUBL
SUBLINGUAL_TABLET | SUBLINGUAL | Status: AC
  Filled 2020-11-15: qty 2

## 2020-11-16 ENCOUNTER — Other Ambulatory Visit: Payer: Self-pay

## 2020-11-16 DIAGNOSIS — I2510 Atherosclerotic heart disease of native coronary artery without angina pectoris: Secondary | ICD-10-CM

## 2020-11-16 DIAGNOSIS — Z8249 Family history of ischemic heart disease and other diseases of the circulatory system: Secondary | ICD-10-CM

## 2020-11-19 LAB — LIPID PANEL
Chol/HDL Ratio: 3 ratio (ref 0.0–4.4)
Cholesterol, Total: 161 mg/dL (ref 100–199)
HDL: 53 mg/dL (ref >39)
LDL Chol Calc (NIH): 93 mg/dL (ref 0–99)
Triglycerides: 77 mg/dL (ref 0–149)
VLDL Cholesterol Cal: 15 mg/dL (ref 5–40)

## 2020-11-19 LAB — HEPATIC FUNCTION PANEL
ALT: 16 IU/L (ref 0–32)
AST: 21 IU/L (ref 0–40)
Albumin: 4.2 g/dL (ref 3.8–4.9)
Alkaline Phosphatase: 83 IU/L (ref 44–121)
Bilirubin Total: 0.5 mg/dL (ref 0.0–1.2)
Bilirubin, Direct: 0.14 mg/dL (ref 0.00–0.40)
Total Protein: 6.9 g/dL (ref 6.0–8.5)

## 2020-11-20 ENCOUNTER — Other Ambulatory Visit: Payer: Self-pay | Admitting: *Deleted

## 2020-11-20 DIAGNOSIS — E785 Hyperlipidemia, unspecified: Secondary | ICD-10-CM

## 2020-11-20 MED ORDER — ROSUVASTATIN CALCIUM 10 MG PO TABS: 10.0000 mg | ORAL_TABLET | Freq: Every day | ORAL | 3 refills | Status: DC

## 2020-11-21 ENCOUNTER — Telehealth: Payer: Self-pay | Admitting: Family

## 2020-11-21 NOTE — Telephone Encounter (Signed)
    Pt is returning call, pt is in vacation and cannot promise she can answer the call. She said to leave detailed message on her VM or message her on mychart.

## 2020-11-21 NOTE — Telephone Encounter (Signed)
Returned call to patient who was calling in to go over recent lab work.   Advised patient of the following:   Hi Miss Baham,  Your lab work showed normal liver function. Total cholesterol, triglycerides, and HDL (good cholesterol) all look great! LDL is 93 with goal of less than 70. Recommend starting Rosuvastatin 10mg  daily. This is a low dose of a well tolerated cholesterol medication. It will help prevent any further plaque buildup in your heart arteries. One of our nursing team will contact you to discuss. Best, Loel Dubonnet, NP   Patient states that she will need to check with her "Liver" doctor before starting the Rosuvastatin, and will call back when she is ready to start to make [name]- NP aware.   Advised patient that I would forward her message to [facility] to make aware. Patient verbalized understanding.

## 2020-11-22 NOTE — Telephone Encounter (Signed)
Attempted to call patient, left message for patient to call back to office.   

## 2020-12-07 ENCOUNTER — Other Ambulatory Visit (HOSPITAL_COMMUNITY): Payer: BC Managed Care – PPO

## 2020-12-12 NOTE — Telephone Encounter (Signed)
MyChart message sent to patient with Hansel Feinstein recommendations.   Thank you for making me aware! If her lipid doctor is concerned about utilization of statin, we could consider using Ezetimibe (Zetia) 10mg  daily instead as  a starting point.   Best,  Loel Dubonnet, NP

## 2020-12-28 ENCOUNTER — Other Ambulatory Visit: Payer: Self-pay

## 2020-12-28 ENCOUNTER — Ambulatory Visit (HOSPITAL_COMMUNITY): Payer: BC Managed Care – PPO | Attending: Cardiovascular Disease

## 2020-12-28 DIAGNOSIS — R0789 Other chest pain: Secondary | ICD-10-CM | POA: Diagnosis not present

## 2020-12-28 LAB — ECHOCARDIOGRAM COMPLETE
Area-P 1/2: 4.49 cm2
S' Lateral: 2.4 cm

## 2021-01-01 ENCOUNTER — Telehealth: Payer: Self-pay | Admitting: Cardiovascular Disease

## 2021-01-01 NOTE — Telephone Encounter (Signed)
Routed to primary nurse pending labs faxed

## 2021-01-01 NOTE — Telephone Encounter (Signed)
Pt PCP is starting her on repatha and the last labs were faxed today

## 2021-01-03 NOTE — Telephone Encounter (Signed)
I think [name] is a great choice! No need for lab work prior to 01/18/21 appt.   Hoy Morn

## 2021-01-18 ENCOUNTER — Other Ambulatory Visit: Payer: Self-pay

## 2021-01-18 ENCOUNTER — Ambulatory Visit: Payer: BC Managed Care – PPO | Admitting: Physician Assistant

## 2021-01-18 ENCOUNTER — Encounter: Payer: Self-pay | Admitting: Physician Assistant

## 2021-01-18 VITALS — BP 122/60 | HR 71 | Ht 60.0 in | Wt 173.8 lb

## 2021-01-18 DIAGNOSIS — I2510 Atherosclerotic heart disease of native coronary artery without angina pectoris: Secondary | ICD-10-CM

## 2021-01-18 DIAGNOSIS — E785 Hyperlipidemia, unspecified: Secondary | ICD-10-CM

## 2021-01-18 DIAGNOSIS — K760 Fatty (change of) liver, not elsewhere classified: Secondary | ICD-10-CM

## 2021-01-18 NOTE — Progress Notes (Signed)
Cardiology Office Note:    Date:  01/20/2021   ID:  Helen Jackson, DOB 10/11/1960, MRN [id number]  PCP:  [name], MD   [facility] Providers Cardiologist:  [name], MD     Referring MD: [name], MD   Chief Complaint  Patient presents with   Follow-up    Seen for Dr. Claiborne Billings    History of Present Illness:    Helen Jackson is a 60 y.o. female with a hx of mitral valve prolapse, cervical disc disease with surgical repair, nonalcoholic fatty liver disease, coronary artery calcification on CT, COVID-19 pneumonia, Sica syndrome, autoimmune diathesis, and obesity.  She has a strong family history of premature cardiovascular disease.  Her mother had stroke in her 92s and a maternal grandmother and mother both had MI in their 78s and 55s.  Previous treadmill stress test was negative.  Echocardiogram in December 2014 showed EF 60 to 10%, systolic bowing of the mitral valve without definitive prolapse, trivial MR, normal diastolic parameter.  Sleep study showed increased upper airway resistance without definitive obstructive sleep apnea.  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  She has been evaluated by GI service related to elevated liver enzyme and diagnosed with nonalcoholic fatty liver disease.  She had COVID-19 pneumonia in September 2021 however did not require hospitalization.  She was out of work and ill for 5 weeks.  CTA at the time revealed two-vessel coronary artery calcification.  She was most recently seen by [name], NP on 11/09/2020 at which time she complained of persistent fatigue since her diagnosis of COVID.  She also complained of palpitation and chest discomfort.  Coronary CT and repeat echocardiogram were recommended.  Coronary CT performed on 11/15/2020 revealed minimal to mild mixed nonobstructive CAD, coronary calcium score of 447 which placed the patient in 93rd percentile for age and sex matched control, aggressive  cardiovascular risk factor modification was recommended.  Subsequent echocardiogram performed on 12/28/2020 showed EF 60 to 65%, bowing of the mitral valve leaflet without true mitral valve prolapse, otherwise no significant valve issue.  Patient presents today for follow-up.  She attributed the previous chest pain to her anemia.  Since then, her hemoglobin has normalized.  Her symptom has resolved.  She has been prescribed aspirin and Crestor.  She has not started on either one.  Her GI doctor was reluctant however finally agreed to let her start on aspirin.  Her PCP recommended switched her Crestor to [name] which I also think is a good idea.  She has already been approved by her insurance for the [place].  She can have a repeat blood work in 2 to 3 months by her PCP.  Otherwise, I think she is doing well.  She can see Dr. Claiborne Billings in 6 months, after that, if she is doing well can follow-up on a yearly basis.   Past Medical History:  Diagnosis Date   Arthritis    Celiac disease    Complication of anesthesia    difficulty waking up   Elevated alkaline phosphatase level    Fibroid    Heart murmur    Dr. Kelly,cardiolgy follows   MVP (mitral valve prolapse)    Ocular rosacea    Peripheral neuropathy    Pernicious anemia    injections every 4 to 6 weeks -last injestion 09-15-14   Seizures ([place])    see notes in anesthesia   Upper airway resistance syndrome    After sleep study  Upper respiratory disease    upper respiratory airway disease    Past Surgical History:  Procedure Laterality Date   ANKLE SURGERY     CESAREAN SECTION     COLONOSCOPY WITH PROPOFOL N/A 11/28/2014   Procedure: COLONOSCOPY WITH PROPOFOL;  Surgeon: Juanita Craver, MD;  [facility]: [facility];  Service: Endoscopy;  Laterality: N/A;   ESOPHAGOGASTRODUODENOSCOPY (EGD) WITH PROPOFOL N/A 11/28/2014   Procedure: ESOPHAGOGASTRODUODENOSCOPY (EGD) WITH PROPOFOL;  Surgeon: Juanita Craver, MD;  [facility]: [facility];  Service:  Endoscopy;  Laterality: N/A;   INCISION AND DRAINAGE     bartholin cyst   KNEE SURGERY     bilateral   NASAL SEPTOPLASTY W/ TURBINOPLASTY Bilateral 05/09/2019   Procedure: NASAL SEPTOPLASTY WITH BILATERAL  TURBINATE REDUCTION;  Surgeon: Leta Baptist, MD;  Location: [place];  Service: ENT;  Laterality: Bilateral;   NECK SURGERY  2019   Cervical   TONSILLECTOMY      Current Medications: Current Meds  Medication Sig   aspirin EC 81 MG tablet Take 1 tablet (81 mg total) by mouth daily. Swallow whole.   Cholecalciferol (VITAMIN D3) 1.25 MG (50000 UT) CAPS Take 50,000 Units by mouth every Sunday.    Cyanocobalamin (B-12) 1000 MCG/ML KIT Inject 1,000 mcg as directed See admin instructions. Every 4-6 weeks   EPIPEN 2-PAK 0.3 MG/0.3ML SOAJ injection Inject 0.3 mg as directed as needed (for allergies/never used). Reported on 08/30/2015   Evolocumab ([name] [place]) Inject into the skin.   metoprolol tartrate (LOPRESSOR) 100 MG tablet Take one tablet two hours prior to your cardiac CTA.   metroNIDAZOLE (METROGEL) 1 % gel Apply 1 application topically at bedtime as needed (irritation).      Allergies:   Gentamicin, Influenza vaccines, Other, Almond oil, Amoxicillin, Doxycycline, Eggs or egg-derived products, Hydrocodone-acetaminophen, Ibuprofen, Indomethacin, Morphine, Pyridium [phenazopyridine hcl], Soybean-containing drug products, Soybeans [soybean oil], Tetracyclines & related, Tramadol, Wheat bran, and Erythromycin base   Social History   Socioeconomic History   Marital status: Married    Spouse name: Herbie Baltimore    Number of children: 1   Years of education: 16   Highest education level: Not on file  Occupational History   Occupation: [employer]   Tobacco Use   Smoking status: Never   Smokeless tobacco: Never  Vaping Use   Vaping Use: Never used  Substance and Sexual Activity   Alcohol use: No    Alcohol/week: 0.0 standard drinks   Drug use: No   Sexual  activity: Yes    Partners: Male    Birth control/protection: Post-menopausal  Other Topics Concern   Not on file  Social History Narrative   Lives with spouse and son   Caffeine use: 2 to 4 cups    Social Determinants of Health   Financial Resource Strain: Not on file  Food Insecurity: Not on file  Transportation Needs: Not on file  Physical Activity: Not on file  Stress: Not on file  Social Connections: Not on file     Family History: The patient's family history includes Breast cancer in her paternal aunt; CVA in her mother; Cancer in her father; Chorea in her maternal grandmother; Dementia in her mother; Osteoporosis in her mother; Stroke in her mother.  ROS:   Please see the history of present illness.     All other systems reviewed and are negative.  EKGs/Labs/Other Studies Reviewed:    The following studies were reviewed today:  Echo 12/28/2020  1. Left ventricular ejection fraction,  by estimation, is 60 to 65%. The  left ventricle has normal function. The left ventricle has no regional  wall motion abnormalities. Left ventricular diastolic parameters were  normal.   2. Right ventricular systolic function is normal. The right ventricular  size is normal.   3. The MV leaflets are not visualized very well. There appears to be  bowing of the MV leaflets without true mitral valve prolapse. . The mitral  valve is grossly normal. No evidence of mitral valve regurgitation.   4. The aortic valve is grossly normal. Aortic valve regurgitation is not  visualized. No aortic stenosis is present.   EKG:  EKG is not ordered today.  Recent Labs: 02/15/2020: BUN 6; Creatinine, Ser 0.78; Hemoglobin 15.2; Platelets 134; Potassium 4.0; Sodium 139 11/19/2020: ALT 16  Recent Lipid Panel    Component Value Date/Time   CHOL 161 11/19/2020 0803   TRIG 77 11/19/2020 0803   HDL 53 11/19/2020 0803   CHOLHDL 3.0 11/19/2020 0803   CHOLHDL 3.7 10/11/2013 0819   VLDL 18 10/11/2013 0819    LDLCALC 93 11/19/2020 0803     Risk Assessment/Calculations:           Physical Exam:    VS:  BP 122/60   Pulse 71   Ht 5' (1.524 m)   Wt 173 lb 12.8 oz (78.8 kg)   LMP 07/17/2012   SpO2 99%   BMI 33.94 kg/m     Wt Readings from Last 3 Encounters:  01/18/21 173 lb 12.8 oz (78.8 kg)  11/09/20 172 lb 12.8 oz (78.4 kg)  02/15/20 190 lb (86.2 kg)     GEN:  Well nourished, well developed in no acute distress HEENT: Normal NECK: No JVD; No carotid bruits LYMPHATICS: No lymphadenopathy CARDIAC: RRR, no murmurs, rubs, gallops RESPIRATORY:  Clear to auscultation without rales, wheezing or rhonchi  ABDOMEN: Soft, non-tender, non-distended MUSCULOSKELETAL:  No edema; No deformity  SKIN: Warm and dry NEUROLOGIC:  Alert and oriented x 3 PSYCHIATRIC:  Normal affect   ASSESSMENT:    1. 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 Hyperlipidemia LDL goal <70   3. Nonalcoholic fatty liver disease    PLAN:    In order of problems listed above:  CAD: Recent coronary CTA showed mild coronary artery disease.  Chest pain has resolved since the patient's anemia has corrected.  No further work-up is needed.  Started on baby aspirin  Hyperlipidemia: We will aim for LDL of less than 70.  Intolerant of statins due to history of fatty liver disease.  Primary care provider has started her on [name]  Nonalcoholic fatty liver disease: Followed by primary care provider         Medication Adjustments/Labs and Tests Ordered: Current medicines are reviewed at length with the patient today.  Concerns regarding medicines are outlined above.  No orders of the defined types were placed in this encounter.  No orders of the defined types were placed in this encounter.   Patient Instructions  Medication Instructions:  Your physician recommends that you continue on your current medications as directed. Please refer to the Current  Medication list given to you today.  *If you need a refill on your cardiac medications before your next appointment, please call your pharmacy*  Lab Work: NONE ordered at this time of appointment   If you have labs (blood work) drawn today and your tests are completely normal, you will receive your  results only by: MyChart Message (if you have MyChart) OR A paper copy in the mail If you have any lab test that is abnormal or we need to change your treatment, we will call you to review the results.  Testing/Procedures: NONE ordered at this time of appointment   Follow-Up: At [facility], you and your health needs are our priority.  As part of our continuing mission to provide you with exceptional heart care, we have created designated Provider Care Teams.  These Care Teams include your primary Cardiologist (physician) and Advanced Practice Providers (APPs -  Physician Assistants and Nurse Practitioners) who all work together to provide you with the care you need, when you need it.  Your next appointment:   6 month(s)  The format for your next appointment:   In Person  Provider:   [name], MD  Other Instructions    Signed, [name], [place]  01/20/2021 11:18 PM    [place]

## 2021-01-18 NOTE — Patient Instructions (Signed)
Medication Instructions:  Your physician recommends that you continue on your current medications as directed. Please refer to the Current Medication list given to you today.  *If you need a refill on your cardiac medications before your next appointment, please call your pharmacy*  Lab Work: NONE ordered at this time of appointment   If you have labs (blood work) drawn today and your tests are completely normal, you will receive your results only by: [place] (if you have MyChart) OR A paper copy in the mail If you have any lab test that is abnormal or we need to change your treatment, we will call you to review the results.  Testing/Procedures: NONE ordered at this time of appointment   Follow-Up: At [facility], you and your health needs are our priority.  As part of our continuing mission to provide you with exceptional heart care, we have created designated Provider Care Teams.  These Care Teams include your primary Cardiologist (physician) and Advanced Practice Providers (APPs -  Physician Assistants and Nurse Practitioners) who all work together to provide you with the care you need, when you need it.  Your next appointment:   6 month(s)  The format for your next appointment:   In Person  Provider:   [name], MD  Other Instructions

## 2021-01-20 ENCOUNTER — Encounter: Payer: Self-pay | Admitting: Physician Assistant

## 2021-01-21 NOTE — Telephone Encounter (Signed)
Okay to start [place]

## 2021-02-04 NOTE — Telephone Encounter (Signed)
Message was sent from one month ago.  If labs were received they were faxed into the patients chart for review.

## 2021-02-26 ENCOUNTER — Telehealth: Payer: Self-pay | Admitting: *Deleted

## 2021-02-26 ENCOUNTER — Other Ambulatory Visit: Payer: Self-pay

## 2021-02-26 ENCOUNTER — Ambulatory Visit (INDEPENDENT_AMBULATORY_CARE_PROVIDER_SITE_OTHER): Payer: BC Managed Care – PPO | Admitting: Nurse Practitioner

## 2021-02-26 ENCOUNTER — Encounter: Payer: Self-pay | Admitting: Nurse Practitioner

## 2021-02-26 VITALS — BP 118/78 | Ht 62.0 in | Wt 167.0 lb

## 2021-02-26 DIAGNOSIS — N812 Incomplete uterovaginal prolapse: Secondary | ICD-10-CM | POA: Diagnosis not present

## 2021-02-26 DIAGNOSIS — Z780 Asymptomatic menopausal state: Secondary | ICD-10-CM | POA: Diagnosis not present

## 2021-02-26 DIAGNOSIS — N8189 Other female genital prolapse: Secondary | ICD-10-CM

## 2021-02-26 DIAGNOSIS — R39198 Other difficulties with micturition: Secondary | ICD-10-CM

## 2021-02-26 DIAGNOSIS — K5900 Constipation, unspecified: Secondary | ICD-10-CM

## 2021-02-26 DIAGNOSIS — Z01419 Encounter for gynecological examination (general) (routine) without abnormal findings: Secondary | ICD-10-CM | POA: Diagnosis not present

## 2021-02-26 LAB — URINALYSIS, COMPLETE W/RFL CULTURE
Bacteria, UA: NONE SEEN /HPF
Bilirubin Urine: NEGATIVE
Glucose, UA: NEGATIVE
Hgb urine dipstick: NEGATIVE
Hyaline Cast: NONE SEEN /LPF
Ketones, ur: NEGATIVE
Leukocyte Esterase: NEGATIVE
Nitrites, Initial: NEGATIVE
Protein, ur: NEGATIVE
RBC / HPF: NONE SEEN /HPF (ref 0–2)
Specific Gravity, Urine: 1.025 (ref 1.001–1.035)
WBC, UA: NONE SEEN /HPF (ref 0–5)
pH: 7 (ref 5.0–8.0)

## 2021-02-26 LAB — NO CULTURE INDICATED

## 2021-02-26 NOTE — Telephone Encounter (Signed)
Referral placed at [facility] health physical therapy at [facility], left message on patient voicemail they will call to schedule.

## 2021-02-26 NOTE — Telephone Encounter (Signed)
-----   Message from Tamela Gammon, NP sent at 02/26/2021 12:06 PM EDT ----- Please send referral for pelvic floor PT for pelvic floor weakness, 1st degree uterine prolapse, difficulty emptying bladder, and constipation. Thank you.

## 2021-02-26 NOTE — Progress Notes (Signed)
Helen Jackson 1961/05/14 [id number]   History:  60 y.o. G1P1001 presents for annual exam. Postmenopausal - no HRT, no bleeding. Normal pap and mammogram history. Complains of having to push down to fully empty bladder. Colonoscopy February 2022 for constipation and bloody stools - ulcerative proctitis, anal fissure, and diverticulosis found. She is still having symptoms, sees GI this afternoon. Sees cardiology for murmur, MVP.   Gynecologic History Patient's last menstrual period was 07/17/2012.   Contraception/Family planning: post menopausal status Sexually active: Yes  Health Maintenance Last Pap: 01/08/2017. Results were: Normal, 5-year repeat Last mammogram: 11/2020. Results were: Normal Last colonoscopy: 07/2020. 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 Results were: Normal  Past medical history, past surgical history, family history and social history were all reviewed and documented in the EPIC chart. 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   ROS:  A ROS was performed and pertinent positives and negatives are included.  Exam:  Vitals:   02/26/21 1122  BP: 118/78  Weight: 167 lb (75.8 kg)  Height: '5\' 2"'$  (1.575 m)   Body mass index is 30.54 kg/m.  General appearance:  Normal Thyroid:  Symmetrical, normal in size, without palpable masses or nodularity. Respiratory  Auscultation:  Clear without wheezing or rhonchi Cardiovascular  Auscultation:  Regularly irregular without rubs, murmurs or gallops  Edema/varicosities:  Not grossly evident Abdominal  Soft,nontender, without masses, guarding or rebound.  Liver/spleen:  No organomegaly noted  Hernia:  None appreciated  Skin  Inspection:  Grossly normal Breasts: Examined lying and sitting.   Right: Without masses, retractions, nipple discharge or axillary adenopathy.   Left: Without masses, retractions, nipple discharge or axillary adenopathy. Genitourinary   Inguinal/mons:   Normal without inguinal adenopathy  External genitalia:  Normal appearing vulva with no masses, tenderness, or lesions  BUS/Urethra/Skene's glands:  Normal  Vagina:  1st degree uterine prolapse  Cervix:  Normal appearing without discharge or lesions  Uterus:  Normal in size, shape and contour, nontender  Adnexa/parametria:     Rt: Normal in size, without masses or tenderness.   Lt: Normal in size, without masses or tenderness.  Anus and perineum: Normal  Digital rectal exam: Declines  UA negative  Patient informed chaperone available to be present for breast and pelvic exam. Patient has requested no chaperone to be present. Patient has been advised what will be completed during breast and pelvic exam.   Assessment/Plan:  60 y.o. G1P1001 for annual exam.   Well female exam with routine gynecological exam - Education provided on SBEs, importance of preventative screenings, current guidelines, high calcium diet, regular exercise, and multivitamin daily. Labs with PCP.   Postmenopausal - no HRT, no bleeding.  Difficulty urinating - Plan: Urinalysis,Complete w/RFL Culture. Complains of having to push to empty bladder completely. UA negative. No evidence of cystocele. Could be from pelvic floor weakness.   First degree uterine prolapse  Constipation, unspecified constipation type - this is somewhat new for her over the past year. Sees GI for this. Possibility that pelvic floor weakness may be contributing to difficulty passing stool. No evidence of rectocele on pelvic exam and she declined rectal exam due to symptoms and GI visit this afternoon. Recommend pelvic floor PT.   Pelvic floor weakness - recommend pelvic floor PT due to GI and urinary symptoms. She is agreeable.  Screening for cervical cancer - Normal Pap history.  Will repeat at 5-year interval per guidelines.  Screening for breast cancer - Normal mammogram history.  Continue annual screenings.  Normal breast exam  today.  Screening for colon cancer - 07/2020 colonoscopy due to problem. Will repeat at GI's recommended interval.   Screening for osteoporosis - Normal Dexa in 2020 per patient. PCP manages.    Return in 1 year for annual.   Tamela Gammon DNP, 12:11 PM 02/26/2021

## 2021-03-21 ENCOUNTER — Ambulatory Visit: Payer: BC Managed Care – PPO | Admitting: Nurse Practitioner

## 2021-03-21 NOTE — Telephone Encounter (Signed)
Patient scheduled on 04/03/21

## 2021-04-03 ENCOUNTER — Ambulatory Visit: Payer: BC Managed Care – PPO | Attending: Nurse Practitioner | Admitting: Physical Therapy

## 2021-04-03 ENCOUNTER — Other Ambulatory Visit: Payer: Self-pay

## 2021-04-03 ENCOUNTER — Encounter: Payer: Self-pay | Admitting: Physical Therapy

## 2021-04-03 DIAGNOSIS — N8189 Other female genital prolapse: Secondary | ICD-10-CM | POA: Insufficient documentation

## 2021-04-03 DIAGNOSIS — R293 Abnormal posture: Secondary | ICD-10-CM

## 2021-04-03 DIAGNOSIS — M6281 Muscle weakness (generalized): Secondary | ICD-10-CM

## 2021-04-03 DIAGNOSIS — R39198 Other difficulties with micturition: Secondary | ICD-10-CM | POA: Insufficient documentation

## 2021-04-03 DIAGNOSIS — N812 Incomplete uterovaginal prolapse: Secondary | ICD-10-CM | POA: Diagnosis not present

## 2021-04-03 NOTE — Patient Instructions (Signed)
Access Code: [email or web address] URL: [email or web address] Date: 04/03/2021 Prepared by: Jari Favre  Exercises Supine Figure 4 Piriformis Stretch - 1 x daily - 7 x weekly - 1 sets - 3 reps - 30 sec hold Supine Piriformis Stretch with Foot on Ground - 1 x daily - 7 x weekly - 1 sets - 3 reps - 30 sec hold Supine Pelvic Floor Stretch - 1 x daily - 7 x weekly - 1 sets - 3 reps - 30 sec hold Modified Supine Hamstring Stretch with Doorway - 1 x daily - 7 x weekly - 3 sets - 10 reps Seated Hamstring Stretch - 1 x daily - 7 x weekly - 1 sets - 3 reps - 30 sec hold

## 2021-04-03 NOTE — Therapy (Signed)
[facility] @ [place], [place], [address] Phone: [phone]   Fax:  [phone]  Physical Therapy Evaluation  Patient Details  Name: Helen Jackson MRN: [id number] Date of Birth: 06-Jul-1960 Referring Provider (PT): Tamela Gammon, NP   Encounter Date: 04/03/2021   PT End of Session - 04/03/21 1632     Visit Number 1    Date for PT Re-Evaluation 06/26/21    PT Start Time 1620    PT Stop Time 1700    PT Time Calculation (min) 40 min    Activity Tolerance Patient tolerated treatment well    Behavior During Therapy [facility] for tasks assessed/performed             Past Medical History:  Diagnosis Date   Arthritis    Celiac disease    Colitis    Complication of anesthesia    difficulty waking up   Diverticulosis    Elevated alkaline phosphatase level    Fatty liver    Fibroid    Heart murmur    Dr. Joselyn Arrow follows   Lichen planus    MVP (mitral valve prolapse)    Ocular rosacea    Peripheral neuropathy    Pernicious anemia    injections every 4 to 6 weeks -last injestion 09-15-14   Seizures ([place])    see notes in anesthesia   Upper airway resistance syndrome    After sleep study   Upper respiratory disease    upper respiratory airway disease    Past Surgical History:  Procedure Laterality Date   ANKLE SURGERY     CESAREAN SECTION     COLONOSCOPY WITH PROPOFOL N/A 11/28/2014   Procedure: COLONOSCOPY WITH PROPOFOL;  Surgeon: Juanita Craver, MD;  Location: [facility];  Service: Endoscopy;  Laterality: N/A;   ESOPHAGOGASTRODUODENOSCOPY (EGD) WITH PROPOFOL N/A 11/28/2014   Procedure: ESOPHAGOGASTRODUODENOSCOPY (EGD) WITH PROPOFOL;  Surgeon: Juanita Craver, MD;  Location: [facility];  Service: Endoscopy;  Laterality: N/A;   INCISION AND DRAINAGE     bartholin cyst   KNEE SURGERY     bilateral   NASAL SEPTOPLASTY W/ TURBINOPLASTY Bilateral 05/09/2019   Procedure: NASAL SEPTOPLASTY  WITH BILATERAL  TURBINATE REDUCTION;  Surgeon: Leta Baptist, MD;  Location: [facility];  Service: ENT;  Laterality: Bilateral;   NECK SURGERY  2019   Cervical   TONSILLECTOMY      There were no vitals filed for this visit.    Subjective Assessment - 04/03/21 1623     Subjective Pt states she has had constipation for a long time and maybe worse last month.  I have tried diet, stool when on the potty, I found out of have colitis.  Pt states 2 years ago but normal would be 2-3 per day. Stool is sometimes hard. Today it was large and like mashed potatoes. I get sharp pains sometimes Lt flank and not sure where that was from.  I resigned last year.    Pertinent History internal fissure and internal hemmerhoids, colitis, septoplasty - nasal surgery to open nasal passages, cervical surgery    Limitations Sitting    How long can you sit comfortably? sitting causes stiffness and pain on left side    Patient Stated Goals Have better BMs without straining    Currently in Pain? 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      Cognition   Overall Cognitive Status Within Functional Limits for tasks assessed      Functional Tests   Functional tests Single leg stance      Single Leg Stance   Comments Lt LE trendelenburg and unable to balance more than 2 sec       Posture/Postural Control   Posture/Postural Control Postural limitations    Postural Limitations Rounded Shoulders;Forward head;Increased thoracic kyphosis;Anterior pelvic tilt      ROM / Strength   AROM / PROM / Strength Strength;PROM      PROM   Overall PROM Comments hip ER 75%      Strength   Overall Strength Comments Lt LE 4/5; Rt LE 5/5      Flexibility   Soft Tissue Assessment /Muscle Length yes    Hamstrings 80% bil      Palpation   Palpation comment TTP throughout left paraspinals and gluteals; Rt TTP glutes      Ambulation/Gait   Gait Pattern Within Functional Limits                        Objective measurements completed on examination: See above findings.     Pelvic Floor Special Questions - 04/03/21 0001     Prior Pregnancies Yes    Number of Pregnancies 1    Number of C-Sections 1    Currently Sexually Active Yes    Is this Painful No    Urinary Leakage No    Urinary urgency No    Urinary frequency dysuria sometimes    Fecal incontinence No    Falling out feeling (prolapse) --   not feeling   Exam Type Deferred                       PT Education - 04/03/21 1811     Education Details Access Code: [email or web address]    Person(s) Educated Patient    Methods Explanation;Demonstration;Tactile cues;Verbal cues;Handout    Comprehension Verbalized understanding;Returned demonstration                 PT Long Term Goals - 04/03/21 1737       PT LONG TERM GOAL #1   Title Pt will be able to have a BM without straining due to abiltiy to coordinate pelvic floor muscles with breathing and core engaged    Time 12    Period Weeks    Status New    Target Date 06/26/21      PT LONG TERM GOAL #2   Title Pt will be ind with advanced HEP    Time 12    Period Weeks    Status New    Target Date 06/26/21      PT LONG  TERM GOAL #3   Title Pt will be able to perform single leg stand for 8 seconds bil without LOB due to improved  hip and core strength and coordination    Time 12    Period Weeks    Status New    Target Date 06/26/21      PT LONG TERM GOAL #4   Title Pt will have at least 1 BM/day and consistancy will be one solid mass diameter of a quarter at least    Time 12    Period Weeks    Status New    Target Date 06/26/21      PT LONG TERM GOAL #5   Title Pt will be able to empty bladder without straining and produce a normal stream    Time 12    Period Weeks    Status New    Target Date 06/26/21                    Plan - 04/03/21 1804     Clinical Impression Statement Pt is friendly 60 y/o female who presents to clinic with the primary issue of constipation.  Pt has never had difficulty with BM until 2 years ago and now has 1-2/week and straining every time to go.  Pt recently diagnosed with first degress utreine prolapse that is most likely something worsened from the strainging.  Pt also experiences dysuria and other comorbidities as mentioned.  Pt has LE weakness Lt LE . Posture deficits as mentioned above.  Pt has tight hamstrings and trigger points that are very TTP throuhgout left thoracic and lumbar paraspinals and bil gluteals.  Pt was not able to tolerate an internal pelvic assessment due to pain from internal fissure and hemorrhoids.  Pt will benefit from skilled PT to address all impairments and restore toileting funciton in order to avoid further complications.    Personal Factors and Comorbidities Comorbidity 3+    Comorbidities hx of colitis, neck surgery, nasal surgery, c-section    Examination-Activity Limitations Toileting;Continence;Sit    Stability/Clinical Decision Making Evolving/Moderate complexity    Clinical Decision Making Moderate    Rehab Potential Good    PT Frequency 1x / week    PT Duration 12 weeks    PT Treatment/Interventions ADLs/Self Care Home Management;Biofeedback;Cryotherapy;Electrical Stimulation;Moist Heat;Therapeutic activities;Therapeutic  exercise;Neuromuscular re-education;Patient/family education;Manual techniques;Taping;Dry needling;Passive range of motion    PT Next Visit Plan internal assessment at some point if needed/tolerated; f/u on HEP, abdominal fascial release and self massage techniques, adductor stretch    PT Home Exercise Plan Access Code: [email or web address]    Consulted and Agree with Plan of Care Patient             Patient will benefit from skilled therapeutic intervention in order to improve the following deficits and impairments:  Pain, Postural dysfunction, Impaired flexibility, Increased fascial restricitons, Decreased strength, Decreased coordination, Decreased range of motion, Increased muscle spasms  Visit Diagnosis: Abnormal posture  Muscle weakness (generalized)     Problem List Patient Active Problem List   Diagnosis Date Noted   Cervical spondylosis with radiculopathy 12/07/2017   Chronic pain of left ankle 10/02/2017   Myopic astigmatism of both eyes 11/14/2015   Word finding difficulty 09/10/2015   Cognitive change 09/10/2015   Insulin resistance 48/54/6270   Nonalcoholic fatty liver disease 05/31/2015   UARS (upper airway resistance syndrome) 07/14/2013   Mitral valve prolapse 06/02/2013   Pernicious anemia 06/02/2013   Obesity, Class II, BMI 35-39.9, with  comorbidity 06/02/2013   Vitamin D insufficiency 06/02/2013   Obstructive sleep apnea 06/02/2013   Palpitations 06/02/2013   Family history of heart disease 06/02/2013   Blepharitis with rosacea 06/02/2013   Low back pain 06/01/2013   Chalazion of right upper eyelid 12/29/2012   Posterior tibial tendinitis 10/09/2011    Jule Ser, PT 04/03/2021, 6:15 PM  [facility] @ [place], [place], [address] Phone: [phone]   Fax:  [phone]  Name: Helen Jackson MRN: [id number] Date of Birth: 01-31-1961

## 2021-04-15 ENCOUNTER — Encounter: Payer: Self-pay | Admitting: Physical Therapy

## 2021-04-15 ENCOUNTER — Other Ambulatory Visit: Payer: Self-pay

## 2021-04-15 ENCOUNTER — Ambulatory Visit: Payer: BC Managed Care – PPO | Admitting: Physical Therapy

## 2021-04-15 DIAGNOSIS — R293 Abnormal posture: Secondary | ICD-10-CM

## 2021-04-15 DIAGNOSIS — N812 Incomplete uterovaginal prolapse: Secondary | ICD-10-CM | POA: Diagnosis not present

## 2021-04-15 DIAGNOSIS — M6281 Muscle weakness (generalized): Secondary | ICD-10-CM

## 2021-04-15 NOTE — Therapy (Signed)
[facility] @ [place], [place], [address] Phone: [phone]   Fax:  [phone]  Physical Therapy Treatment  Patient Details  Name: Helen Jackson MRN: [id number] Date of Birth: 1961-02-18 Referring Provider (PT): Tamela Gammon, NP   Encounter Date: 04/15/2021   PT End of Session - 04/15/21 1016     Visit Number 2    Date for PT Re-Evaluation 06/26/21    PT Start Time 1014    PT Stop Time 1101    PT Time Calculation (min) 47 min    Activity Tolerance Patient tolerated treatment well    Behavior During Therapy [facility] for tasks assessed/performed             Past Medical History:  Diagnosis Date   Arthritis    Celiac disease    Colitis    Complication of anesthesia    difficulty waking up   Diverticulosis    Elevated alkaline phosphatase level    Fatty liver    Fibroid    Heart murmur    Dr. Joselyn Arrow follows   Lichen planus    MVP (mitral valve prolapse)    Ocular rosacea    Peripheral neuropathy    Pernicious anemia    injections every 4 to 6 weeks -last injestion 09-15-14   Seizures ([place])    see notes in anesthesia   Upper airway resistance syndrome    After sleep study   Upper respiratory disease    upper respiratory airway disease    Past Surgical History:  Procedure Laterality Date   ANKLE SURGERY     CESAREAN SECTION     COLONOSCOPY WITH PROPOFOL N/A 11/28/2014   Procedure: COLONOSCOPY WITH PROPOFOL;  Surgeon: Juanita Craver, MD;  Location: [facility];  Service: Endoscopy;  Laterality: N/A;   ESOPHAGOGASTRODUODENOSCOPY (EGD) WITH PROPOFOL N/A 11/28/2014   Procedure: ESOPHAGOGASTRODUODENOSCOPY (EGD) WITH PROPOFOL;  Surgeon: Juanita Craver, MD;  Location: [facility];  Service: Endoscopy;  Laterality: N/A;   INCISION AND DRAINAGE     bartholin cyst   KNEE SURGERY     bilateral   NASAL SEPTOPLASTY W/ TURBINOPLASTY Bilateral 05/09/2019   Procedure: NASAL SEPTOPLASTY WITH  BILATERAL  TURBINATE REDUCTION;  Surgeon: Leta Baptist, MD;  Location: [facility];  Service: ENT;  Laterality: Bilateral;   NECK SURGERY  2019   Cervical   TONSILLECTOMY      There were no vitals filed for this visit.   Subjective Assessment - 04/15/21 1128     Subjective I was squatting over the toilet and that seems to help yesterday.  Overall, everything feels the same.  I didn't do the exercises as much because I tweaked my back but it is better now    Patient Stated Goals Have better BMs without straining    Currently in Pain? No/denies                               [facility] Adult PT Treatment/Exercise - 04/15/21 0001       Exercises   Exercises Other Exercises    Other Exercises  seated thoracic ext      Manual Therapy   Manual Therapy Soft tissue mobilization;Myofascial release    Soft tissue mobilization lumbar and thoracic paraspinals, QL bil, gluteals bil;    Myofascial Release abdomen around all parts ofthe colon, rectum, bladder  PT Education - 04/15/21 1127     Education Details added thoracic ext    Person(s) Educated Patient    Methods Explanation;Demonstration;Tactile cues;Verbal cues;Handout    Comprehension Verbalized understanding;Returned demonstration                 PT Long Term Goals - 04/15/21 1126       PT LONG TERM GOAL #1   Title Pt will be able to have a BM without straining due to abiltiy to coordinate pelvic floor muscles with breathing and core engaged    Status On-going      PT LONG TERM GOAL #2   Title Pt will be ind with advanced HEP    Status On-going      PT LONG TERM GOAL #3   Title Pt will be able to perform single leg stand for 8 seconds bil without LOB due to improved hip and core strength and coordination    Status On-going      PT LONG TERM GOAL #4   Title Pt will have at least 1 BM/day and consistancy will be one solid mass diameter of a quarter at least     Status On-going      PT LONG TERM GOAL #5   Title Pt will be able to empty bladder without straining and produce a normal stream    Status On-going                   Plan - 04/15/21 1118     Clinical Impression Statement Pt has not met goals yet, but did states she had an easier time with BM when she was squatting and hovering over the toilet yesterday.  Some time was spent discussing toileting techniques and why that may have helped.  Pt responded well to fascial release and STM.  Pt was given update to HEP as seen and encouraged to continue doing all stretches tomaintain improved soft tissue length.  Pt will benefit from skilled PT to continue to work on core strength and muscle coordination for improved bowel and bladder emptying.  Pt will most likely not want anything internally but will continue to revisit this as needed.    PT Treatment/Interventions ADLs/Self Care Home Management;Biofeedback;Cryotherapy;Electrical Stimulation;Moist Heat;Therapeutic activities;Therapeutic exercise;Neuromuscular re-education;Patient/family education;Manual techniques;Taping;Dry needling;Passive range of motion    PT Next Visit Plan f/u on HEP, abdominal fascial release and self massage techniques, begin core strength and postural strength in supine, adductor stretch; internal assessment at some point if needed/tolerated but she doesn't want if possible    PT Home Exercise Plan Access Code: [email or web address]    Consulted and Agree with Plan of Care Patient             Patient will benefit from skilled therapeutic intervention in order to improve the following deficits and impairments:  Pain, Postural dysfunction, Impaired flexibility, Increased fascial restricitons, Decreased strength, Decreased coordination, Decreased range of motion, Increased muscle spasms  Visit Diagnosis: Muscle weakness (generalized)  Abnormal posture     Problem List Patient Active Problem List   Diagnosis Date Noted    Cervical spondylosis with radiculopathy 12/07/2017   Chronic pain of left ankle 10/02/2017   Myopic astigmatism of both eyes 11/14/2015   Word finding difficulty 09/10/2015   Cognitive change 09/10/2015   Insulin resistance 28/00/3491   Nonalcoholic fatty liver disease 05/31/2015   UARS (upper airway resistance syndrome) 07/14/2013   Mitral valve prolapse 06/02/2013   Pernicious anemia 06/02/2013   Obesity, Class II,  BMI 35-39.9, with comorbidity 06/02/2013   Vitamin D insufficiency 06/02/2013   Obstructive sleep apnea 06/02/2013   Palpitations 06/02/2013   Family history of heart disease 06/02/2013   Blepharitis with rosacea 06/02/2013   Low back pain 06/01/2013   Chalazion of right upper eyelid 12/29/2012   Posterior tibial tendinitis 10/09/2011    Jule Ser, PT 04/15/2021, 11:33 AM  [facility] @ [place], [place], [address] Phone: [phone]   Fax:  [phone]  Name: Helen Jackson MRN: [id number] Date of Birth: September 21, 1960

## 2021-04-22 ENCOUNTER — Encounter: Payer: Self-pay | Admitting: Physical Therapy

## 2021-04-29 ENCOUNTER — Ambulatory Visit: Payer: BC Managed Care – PPO | Attending: Nurse Practitioner | Admitting: Physical Therapy

## 2021-04-29 ENCOUNTER — Encounter: Payer: Self-pay | Admitting: Physical Therapy

## 2021-04-29 ENCOUNTER — Other Ambulatory Visit: Payer: Self-pay

## 2021-04-29 DIAGNOSIS — R293 Abnormal posture: Secondary | ICD-10-CM | POA: Insufficient documentation

## 2021-04-29 DIAGNOSIS — M6281 Muscle weakness (generalized): Secondary | ICD-10-CM | POA: Insufficient documentation

## 2021-04-29 NOTE — Therapy (Signed)
[facility] @ [place], [place], [address] Phone: [phone]   Fax:  [phone]  Physical Therapy Treatment  Patient Details  Name: Helen Jackson MRN: [id number] Date of Birth: Apr 07, 1961 Referring Provider (PT): Tamela Gammon, NP   Encounter Date: 04/29/2021   PT End of Session - 04/29/21 0935     Visit Number 3    Date for PT Re-Evaluation 06/26/21    PT Start Time 0930    PT Stop Time 1012    PT Time Calculation (min) 42 min    Activity Tolerance Patient tolerated treatment well    Behavior During Therapy [facility] for tasks assessed/performed             Past Medical History:  Diagnosis Date   Arthritis    Celiac disease    Colitis    Complication of anesthesia    difficulty waking up   Diverticulosis    Elevated alkaline phosphatase level    Fatty liver    Fibroid    Heart murmur    Dr. Joselyn Arrow follows   Lichen planus    MVP (mitral valve prolapse)    Ocular rosacea    Peripheral neuropathy    Pernicious anemia    injections every 4 to 6 weeks -last injestion 09-15-14   Seizures ([place])    see notes in anesthesia   Upper airway resistance syndrome    After sleep study   Upper respiratory disease    upper respiratory airway disease    Past Surgical History:  Procedure Laterality Date   ANKLE SURGERY     CESAREAN SECTION     COLONOSCOPY WITH PROPOFOL N/A 11/28/2014   Procedure: COLONOSCOPY WITH PROPOFOL;  Surgeon: Juanita Craver, MD;  Location: [facility];  Service: Endoscopy;  Laterality: N/A;   ESOPHAGOGASTRODUODENOSCOPY (EGD) WITH PROPOFOL N/A 11/28/2014   Procedure: ESOPHAGOGASTRODUODENOSCOPY (EGD) WITH PROPOFOL;  Surgeon: Juanita Craver, MD;  Location: [facility];  Service: Endoscopy;  Laterality: N/A;   INCISION AND DRAINAGE     bartholin cyst   KNEE SURGERY     bilateral   NASAL SEPTOPLASTY W/ TURBINOPLASTY Bilateral 05/09/2019   Procedure: NASAL SEPTOPLASTY WITH  BILATERAL  TURBINATE REDUCTION;  Surgeon: Leta Baptist, MD;  Location: [place];  Service: ENT;  Laterality: Bilateral;   NECK SURGERY  2019   Cervical   TONSILLECTOMY      There were no vitals filed for this visit.   Subjective Assessment - 04/29/21 1011     Subjective I had great BMs for a few days after the last time we met.    Patient Stated Goals Have better BMs without straining    Currently in Pain? No/denies                               [facility] Adult PT Treatment/Exercise - 04/29/21 0001       Self-Care   Self-Care Other Self-Care Comments    Other Self-Care Comments  explained how to do abdominal massage      Exercises   Exercises Other Exercises;Lumbar      Lumbar Exercises: Standing   Other Standing Lumbar Exercises shoulder flexion and ext - red band - 10x each bil - exhale on exertion      Lumbar Exercises: Supine   Ab Set 10 reps    AB Set Limitations hands on thighs or on side - TrA  and oblique activation with breathing      Manual Therapy   Myofascial Release abdomen around all parts ofthe colon, rectum, bladder                     PT Education - 04/29/21 1012     Education Details Access Code: [email or web address]    Person(s) Educated Patient    Methods Explanation;Demonstration;Tactile cues;Verbal cues;Handout    Comprehension Returned demonstration;Verbalized understanding                 PT Long Term Goals - 04/15/21 1126       PT LONG TERM GOAL #1   Title Pt will be able to have a BM without straining due to abiltiy to coordinate pelvic floor muscles with breathing and core engaged    Status On-going      PT LONG TERM GOAL #2   Title Pt will be ind with advanced HEP    Status On-going      PT LONG TERM GOAL #3   Title Pt will be able to perform single leg stand for 8 seconds bil without LOB due to improved hip and core strength and coordination    Status On-going      PT LONG TERM GOAL #4    Title Pt will have at least 1 BM/day and consistancy will be one solid mass diameter of a quarter at least    Status On-going      PT LONG TERM GOAL #5   Title Pt will be able to empty bladder without straining and produce a normal stream    Status On-going                   Plan - 04/29/21 1012     Clinical Impression Statement Pt did well with treatment last time and had improved BMs.  Pt still had some fascial restrictions on the left side that released well with MFR.  Pt was given abdominal massge techniques to continue to work on release of any other soft tissue in abdomen so she will maintain improved soft tissue  Pt needed cues with beginning exercises for core strength.  She tends to overuse the diaphragm and not TrA and obliques more turned off. Pt will benefit from skilled PT to continue to address core.    PT Treatment/Interventions ADLs/Self Care Home Management;Biofeedback;Cryotherapy;Electrical Stimulation;Moist Heat;Therapeutic activities;Therapeutic exercise;Neuromuscular re-education;Patient/family education;Manual techniques;Taping;Dry needling;Passive range of motion    PT Next Visit Plan f/u on self massage to abdomen, cont core strength with pallof, warm up on nustep core strength and posture, obliques    PT Home Exercise Plan Access Code: [email or web address]    Consulted and Agree with Plan of Care Patient             Patient will benefit from skilled therapeutic intervention in order to improve the following deficits and impairments:  Pain, Postural dysfunction, Impaired flexibility, Increased fascial restricitons, Decreased strength, Decreased coordination, Decreased range of motion, Increased muscle spasms  Visit Diagnosis: Muscle weakness (generalized)  Abnormal posture     Problem List Patient Active Problem List   Diagnosis Date Noted   Cervical spondylosis with radiculopathy 12/07/2017   Chronic pain of left ankle 10/02/2017   Myopic astigmatism of  both eyes 11/14/2015   Word finding difficulty 09/10/2015   Cognitive change 09/10/2015   Insulin resistance 51/70/0174   Nonalcoholic fatty liver disease 05/31/2015   UARS (upper airway resistance syndrome) 07/14/2013   Mitral valve prolapse  06/02/2013   Pernicious anemia 06/02/2013   Obesity, Class II, BMI 35-39.9, with comorbidity 06/02/2013   Vitamin D insufficiency 06/02/2013   Obstructive sleep apnea 06/02/2013   Palpitations 06/02/2013   Family history of heart disease 06/02/2013   Blepharitis with rosacea 06/02/2013   Low back pain 06/01/2013   Chalazion of right upper eyelid 12/29/2012   Posterior tibial tendinitis 10/09/2011    Jule Ser, PT 04/29/2021, 10:16 AM  [place] [place] @ [place], [place], [address] Phone: [phone]   Fax:  [phone]  Name: Helen Jackson MRN: [id number] Date of Birth: 1960/08/31

## 2021-04-29 NOTE — Patient Instructions (Signed)
Access Code: [email or web address] URL: [email or web address] Date: 04/29/2021 Prepared by: Jari Favre  Exercises Supine Figure 4 Piriformis Stretch - 1 x daily - 7 x weekly - 1 sets - 3 reps - 30 sec hold Supine Piriformis Stretch with Foot on Ground - 1 x daily - 7 x weekly - 1 sets - 3 reps - 30 sec hold Supine Pelvic Floor Stretch - 1 x daily - 7 x weekly - 1 sets - 3 reps - 30 sec hold Modified Supine Hamstring Stretch with Doorway - 1 x daily - 7 x weekly - 3 sets - 10 reps Seated Hamstring Stretch - 1 x daily - 7 x weekly - 1 sets - 3 reps - 30 sec hold Seated Thoracic Lumbar Extension - 1 x daily - 7 x weekly - 1 sets - 10 reps - 5 sec hold Shoulder extension with resistance - Neutral - 1 x daily - 7 x weekly - 2 sets - 10 reps Standing Shoulder Flexion with Resistance - 1 x daily - 7 x weekly - 2 sets - 10 reps

## 2021-05-06 ENCOUNTER — Encounter: Payer: Self-pay | Admitting: Physical Therapy

## 2021-05-06 ENCOUNTER — Ambulatory Visit: Payer: BC Managed Care – PPO | Admitting: Physical Therapy

## 2021-05-06 ENCOUNTER — Ambulatory Visit: Payer: BC Managed Care – PPO | Admitting: Nurse Practitioner

## 2021-05-06 ENCOUNTER — Other Ambulatory Visit: Payer: Self-pay

## 2021-05-06 DIAGNOSIS — M6281 Muscle weakness (generalized): Secondary | ICD-10-CM | POA: Diagnosis not present

## 2021-05-06 DIAGNOSIS — R293 Abnormal posture: Secondary | ICD-10-CM

## 2021-05-06 NOTE — Therapy (Signed)
[place] @ [place], [place], [address] Phone: [phone]   Fax:  [phone]  Physical Therapy Treatment  Patient Details  Name: Helen Jackson MRN: [id number] Date of Birth: Apr 03, 1961 Referring Provider (PT): Tamela Gammon, NP   Encounter Date: 05/06/2021   PT End of Session - 05/06/21 1124     Visit Number 4    Date for PT Re-Evaluation 06/26/21    Authorization Type bcbs    PT Start Time 0930    PT Stop Time 1008    PT Time Calculation (min) 38 min    Activity Tolerance Patient tolerated treatment well    Behavior During Therapy [facility] for tasks assessed/performed             Past Medical History:  Diagnosis Date   Arthritis    Celiac disease    Colitis    Complication of anesthesia    difficulty waking up   Diverticulosis    Elevated alkaline phosphatase level    Fatty liver    Fibroid    Heart murmur    Dr. Joselyn Arrow follows   Lichen planus    MVP (mitral valve prolapse)    Ocular rosacea    Peripheral neuropathy    Pernicious anemia    injections every 4 to 6 weeks -last injestion 09-15-14   Seizures ([place])    see notes in anesthesia   Upper airway resistance syndrome    After sleep study   Upper respiratory disease    upper respiratory airway disease    Past Surgical History:  Procedure Laterality Date   ANKLE SURGERY     CESAREAN SECTION     COLONOSCOPY WITH PROPOFOL N/A 11/28/2014   Procedure: COLONOSCOPY WITH PROPOFOL;  Surgeon: Juanita Craver, MD;  [facility];  Service: Endoscopy;  Laterality: N/A;   ESOPHAGOGASTRODUODENOSCOPY (EGD) WITH PROPOFOL N/A 11/28/2014   Procedure: ESOPHAGOGASTRODUODENOSCOPY (EGD) WITH PROPOFOL;  Surgeon: Juanita Craver, MD;  [facility];  Service: Endoscopy;  Laterality: N/A;   INCISION AND DRAINAGE     bartholin cyst   KNEE SURGERY     bilateral   NASAL SEPTOPLASTY W/ TURBINOPLASTY Bilateral 05/09/2019    Procedure: NASAL SEPTOPLASTY WITH BILATERAL  TURBINATE REDUCTION;  Surgeon: Leta Baptist, MD;  Location: [place];  Service: ENT;  Laterality: Bilateral;   NECK SURGERY  2019   Cervical   TONSILLECTOMY      There were no vitals filed for this visit.   Subjective Assessment - 05/06/21 0934     Subjective I had some improvement in going to the bathroom but still not 100% regular.  I am having an easier time getting it out.  Pt did not make diet changes    Patient Stated Goals Have better BMs without straining    Currently in Pain? No/denies                               [facility] Adult PT Treatment/Exercise - 05/06/21 0001       Neuro Re-ed    Neuro Re-ed Details  verbal and tactile cues to engage core and keep spine neutral with education and performing exercises correctly      Lumbar Exercises: Supine   Ab Set 10 reps    AB Set Limitations TCs    Other Supine Lumbar Exercises dead bug with LE only - core engaged and back flat  Manual Therapy   Myofascial Release abdomen around all parts ofthe colon, rectum, bladder                          PT Long Term Goals - 05/06/21 0936       PT LONG TERM GOAL #1   Title Pt will be able to have a BM without straining due to abiltiy to coordinate pelvic floor muscles with breathing and core engaged    Baseline only very small strain and I feel like it all coming    Status On-going      PT LONG TERM GOAL #2   Title Pt will be ind with advanced HEP    Status On-going      PT LONG TERM GOAL #3   Title Pt will be able to perform single leg stand for 8 seconds bil without LOB due to improved hip and core strength and coordination    Status On-going      PT LONG TERM GOAL #4   Title Pt will have at least 1 BM/day and consistancy will be one solid mass diameter of a quarter at least    Baseline it is every few days, but consistency of stool is all over the place    Status On-going       PT LONG TERM GOAL #5   Title Pt will be able to empty bladder without straining and produce a normal stream    Baseline not straining and the stream feels normal    Status Achieved                   Plan - 05/06/21 1015     Clinical Impression Statement Pt reports her BMs are much less strain and feel more complete.  She is having every 3rd day . Pt was given more challengeing core exercises as she was doing the bands at home and now feels these are easy. Pt able to do tapping foot with some difficulty but does correctly.  This was added to HEP.  Pt continues to get releases withMFR techniqes and has less spot that are TTP. Today, lower Rt quadrant slightly warm and TTP.  Continue to work on [facility] and core strength    PT Treatment/Interventions ADLs/Self Care Home Management;Biofeedback;Cryotherapy;Electrical Stimulation;Moist Heat;Therapeutic activities;Therapeutic exercise;Neuromuscular re-education;Patient/family education;Manual techniques;Taping;Dry needling;Passive range of motion    PT Next Visit Plan abdominal release and core with pallof, nustep, obliques    Consulted and Agree with Plan of Care Patient             Patient will benefit from skilled therapeutic intervention in order to improve the following deficits and impairments:  Pain, Postural dysfunction, Impaired flexibility, Increased fascial restricitons, Decreased strength, Decreased coordination, Decreased range of motion, Increased muscle spasms  Visit Diagnosis: Muscle weakness (generalized)  Abnormal posture     Problem List Patient Active Problem List   Diagnosis Date Noted   Cervical spondylosis with radiculopathy 12/07/2017   Chronic pain of left ankle 10/02/2017   Myopic astigmatism of both eyes 11/14/2015   Word finding difficulty 09/10/2015   Cognitive change 09/10/2015   Insulin resistance 94/85/4627   Nonalcoholic fatty liver disease 05/31/2015   UARS (upper airway resistance syndrome)  07/14/2013   Mitral valve prolapse 06/02/2013   Pernicious anemia 06/02/2013   Obesity, Class II, BMI 35-39.9, with comorbidity 06/02/2013   Vitamin D insufficiency 06/02/2013   Obstructive sleep apnea 06/02/2013   Palpitations 06/02/2013  Family history of heart disease 06/02/2013   Blepharitis with rosacea 06/02/2013   Low back pain 06/01/2013   Chalazion of right upper eyelid 12/29/2012   Posterior tibial tendinitis 10/09/2011    Jule Ser, PT 05/06/2021, 11:52 AM  [place] @ [place], [place], [address] Phone: [phone]   Fax:  [phone]  Name: ANNLOUISE GERETY MRN: [id number] Date of Birth: March 31, 1961

## 2021-05-13 ENCOUNTER — Ambulatory Visit: Payer: BC Managed Care – PPO | Admitting: Physical Therapy

## 2021-05-13 ENCOUNTER — Other Ambulatory Visit: Payer: Self-pay

## 2021-05-13 ENCOUNTER — Encounter: Payer: Self-pay | Admitting: Physical Therapy

## 2021-05-13 DIAGNOSIS — M6281 Muscle weakness (generalized): Secondary | ICD-10-CM | POA: Diagnosis not present

## 2021-05-13 DIAGNOSIS — R293 Abnormal posture: Secondary | ICD-10-CM

## 2021-05-13 NOTE — Patient Instructions (Signed)
Access Code: [email or web address] URL: [email or web address] Date: 05/13/2021 Prepared by: Jari Favre  Exercises Supine Figure 4 Piriformis Stretch - 1 x daily - 7 x weekly - 1 sets - 3 reps - 30 sec hold Supine Piriformis Stretch with Foot on Ground - 1 x daily - 7 x weekly - 1 sets - 3 reps - 30 sec hold Supine Pelvic Floor Stretch - 1 x daily - 7 x weekly - 1 sets - 3 reps - 30 sec hold Modified Supine Hamstring Stretch with Doorway - 1 x daily - 7 x weekly - 3 sets - 10 reps Seated Hamstring Stretch - 1 x daily - 7 x weekly - 1 sets - 3 reps - 30 sec hold Seated Thoracic Lumbar Extension - 1 x daily - 7 x weekly - 1 sets - 10 reps - 5 sec hold Shoulder extension with resistance - Neutral - 1 x daily - 7 x weekly - 2 sets - 10 reps Standing Shoulder Flexion with Resistance - 1 x daily - 7 x weekly - 2 sets - 10 reps Sidelying Thoracic Rotation with Open Book - 1 x daily - 7 x weekly - 5 reps - 1 sets - 10 sec hold

## 2021-05-13 NOTE — Therapy (Signed)
[place] @ [place], [place], [address] Phone: [phone]   Fax:  [phone]  Physical Therapy Treatment  Patient Details  Name: Helen Jackson MRN: [id number] Date of Birth: 1961/02/02 Referring Provider (PT): Tamela Gammon, NP   Encounter Date: 05/13/2021   PT End of Session - 05/13/21 0938     Visit Number 5    Date for PT Re-Evaluation 06/26/21    Authorization Type bcbs    PT Start Time 0930    PT Stop Time 1017    PT Time Calculation (min) 47 min    Activity Tolerance Patient tolerated treatment well    Behavior During Therapy [facility] for tasks assessed/performed             Past Medical History:  Diagnosis Date   Arthritis    Celiac disease    Colitis    Complication of anesthesia    difficulty waking up   Diverticulosis    Elevated alkaline phosphatase level    Fatty liver    Fibroid    Heart murmur    Dr. Joselyn Arrow follows   Lichen planus    MVP (mitral valve prolapse)    Ocular rosacea    Peripheral neuropathy    Pernicious anemia    injections every 4 to 6 weeks -last injestion 09-15-14   Seizures ([place])    see notes in anesthesia   Upper airway resistance syndrome    After sleep study   Upper respiratory disease    upper respiratory airway disease    Past Surgical History:  Procedure Laterality Date   ANKLE SURGERY     CESAREAN SECTION     COLONOSCOPY WITH PROPOFOL N/A 11/28/2014   Procedure: COLONOSCOPY WITH PROPOFOL;  Surgeon: Juanita Craver, MD;  [facility];  Service: Endoscopy;  Laterality: N/A;   ESOPHAGOGASTRODUODENOSCOPY (EGD) WITH PROPOFOL N/A 11/28/2014   Procedure: ESOPHAGOGASTRODUODENOSCOPY (EGD) WITH PROPOFOL;  Surgeon: Juanita Craver, MD;  [facility];  Service: Endoscopy;  Laterality: N/A;   INCISION AND DRAINAGE     bartholin cyst   KNEE SURGERY     bilateral   NASAL SEPTOPLASTY W/ TURBINOPLASTY Bilateral 05/09/2019    Procedure: NASAL SEPTOPLASTY WITH BILATERAL  TURBINATE REDUCTION;  Surgeon: Leta Baptist, MD;  Location: [place];  Service: ENT;  Laterality: Bilateral;   NECK SURGERY  2019   Cervical   TONSILLECTOMY      There were no vitals filed for this visit.   Subjective Assessment - 05/13/21 0935     Subjective I did the core exercise from last time and still feeling better. I am only getting up 1x/night now and emptying my bladder better.  I am not feeling as many abdominal pains as I had been.    Patient Stated Goals Have better BMs without straining    Currently in Pain? No/denies                               [facility] Adult PT Treatment/Exercise - 05/13/21 0001       Lumbar Exercises: Stretches   Other Lumbar Stretch Exercise thoracic rotation withbreathing and PT to assist in more motion      Lumbar Exercises: Aerobic   Nustep L2 x 4 min warm up core      Lumbar Exercises: Standing   Other Standing Lumbar Exercises thoracic ext green with exhale - 20x  Other Standing Lumbar Exercises pallof press and rotation with green band - 20x      Manual Therapy   Soft tissue mobilization thoracic paraspinals    Myofascial Release abdomen around all parts ofthe colon, rectum, bladder - Lt side more than Rt today                     PT Education - 05/13/21 1028     Education Details Access Code: [email or web address]    Person(s) Educated Patient    Methods Explanation;Demonstration;Tactile cues;Verbal cues;Handout    Comprehension Verbalized understanding;Returned demonstration                 PT Long Term Goals - 05/13/21 1032       PT LONG TERM GOAL #1   Title Pt will be able to have a BM without straining due to abiltiy to coordinate pelvic floor muscles with breathing and core engaged    Baseline only very small strain and I feel like it all coming    Status On-going      PT LONG TERM GOAL #2   Title Pt will be ind with advanced HEP     Status On-going      PT LONG TERM GOAL #3   Title Pt will be able to perform single leg stand for 8 seconds bil without LOB due to improved hip and core strength and coordination    Status On-going      PT LONG TERM GOAL #4   Title Pt will have at least 1 BM/day and consistancy will be one solid mass diameter of a quarter at least    Status On-going      PT LONG TERM GOAL #5   Title Pt will be able to empty bladder without straining and produce a normal stream    Status Achieved                   Plan - 05/13/21 1028     Clinical Impression Statement Pt is still feeling better with BMs but overall still some issues.  Pt had difficulty with pallof press and rotation due to a lot of stiffness in ribcage and thoracic spine.  Added stretches so she can continue working on this at home.  Pt continues to demonstrate improvement overall and is expected to be able to achieve functional goals with PT.    PT Treatment/Interventions ADLs/Self Care Home Management;Biofeedback;Cryotherapy;Electrical Stimulation;Moist Heat;Therapeutic activities;Therapeutic exercise;Neuromuscular re-education;Patient/family education;Manual techniques;Taping;Dry needling;Passive range of motion    PT Next Visit Plan abdominal release, thoracic paraspinals STM, rotation stretches, and core with pallof, rotation, nustep warm up, obliques    PT Home Exercise Plan Access Code: [email or web address]    Consulted and Agree with Plan of Care Patient             Patient will benefit from skilled therapeutic intervention in order to improve the following deficits and impairments:  Pain, Postural dysfunction, Impaired flexibility, Increased fascial restricitons, Decreased strength, Decreased coordination, Decreased range of motion, Increased muscle spasms  Visit Diagnosis: Muscle weakness (generalized)  Abnormal posture     Problem List Patient Active Problem List   Diagnosis Date Noted   Cervical spondylosis with  radiculopathy 12/07/2017   Chronic pain of left ankle 10/02/2017   Myopic astigmatism of both eyes 11/14/2015   Word finding difficulty 09/10/2015   Cognitive change 09/10/2015   Insulin resistance 51/07/5850   Nonalcoholic fatty liver disease 05/31/2015   UARS (upper airway resistance  syndrome) 07/14/2013   Mitral valve prolapse 06/02/2013   Pernicious anemia 06/02/2013   Obesity, Class II, BMI 35-39.9, with comorbidity 06/02/2013   Vitamin D insufficiency 06/02/2013   Obstructive sleep apnea 06/02/2013   Palpitations 06/02/2013   Family history of heart disease 06/02/2013   Blepharitis with rosacea 06/02/2013   Low back pain 06/01/2013   Chalazion of right upper eyelid 12/29/2012   Posterior tibial tendinitis 10/09/2011    Jule Ser, PT 05/13/2021, 10:35 AM  [place] @ [place], [place], [address] Phone: [phone]   Fax:  [phone]  Name: LAUREN MODISETTE MRN: [id number] Date of Birth: November 24, 1960

## 2021-05-20 ENCOUNTER — Encounter: Payer: Self-pay | Admitting: Physical Therapy

## 2021-05-20 ENCOUNTER — Ambulatory Visit: Payer: BC Managed Care – PPO | Admitting: Rehabilitative and Restorative Service Providers"

## 2021-05-27 ENCOUNTER — Other Ambulatory Visit: Payer: Self-pay

## 2021-05-27 ENCOUNTER — Encounter: Payer: Self-pay | Admitting: Physical Therapy

## 2021-05-27 ENCOUNTER — Ambulatory Visit: Payer: BC Managed Care – PPO | Attending: Nurse Practitioner | Admitting: Physical Therapy

## 2021-05-27 DIAGNOSIS — M6281 Muscle weakness (generalized): Secondary | ICD-10-CM | POA: Diagnosis not present

## 2021-05-27 DIAGNOSIS — R293 Abnormal posture: Secondary | ICD-10-CM | POA: Diagnosis present

## 2021-05-27 NOTE — Therapy (Addendum)
[facility] @ [place], [place], [address] Phone: [phone]   Fax:  [phone]  Physical Therapy Treatment  Patient Details  Name: Helen Jackson MRN: [id number] Date of Birth: Feb 18, 1961 Referring Provider (PT): Tamela Gammon, NP   Encounter Date: 05/27/2021   PT End of Session - 05/27/21 0936     Visit Number 6    Date for PT Re-Evaluation 06/26/21    Authorization Type bcbs    PT Start Time 0931    PT Stop Time 1011    PT Time Calculation (min) 40 min    Activity Tolerance Patient tolerated treatment well    Behavior During Therapy [facility] for tasks assessed/performed             Past Medical History:  Diagnosis Date   Arthritis    Celiac disease    Colitis    Complication of anesthesia    difficulty waking up   Diverticulosis    Elevated alkaline phosphatase level    Fatty liver    Fibroid    Heart murmur    Dr. Joselyn Arrow follows   Lichen planus    MVP (mitral valve prolapse)    Ocular rosacea    Peripheral neuropathy    Pernicious anemia    injections every 4 to 6 weeks -last injestion 09-15-14   Seizures ([place])    see notes in anesthesia   Upper airway resistance syndrome    After sleep study   Upper respiratory disease    upper respiratory airway disease    Past Surgical History:  Procedure Laterality Date   ANKLE SURGERY     CESAREAN SECTION     COLONOSCOPY WITH PROPOFOL N/A 11/28/2014   Procedure: COLONOSCOPY WITH PROPOFOL;  Surgeon: Juanita Craver, MD;  Location: [facility];  Service: Endoscopy;  Laterality: N/A;   ESOPHAGOGASTRODUODENOSCOPY (EGD) WITH PROPOFOL N/A 11/28/2014   Procedure: ESOPHAGOGASTRODUODENOSCOPY (EGD) WITH PROPOFOL;  Surgeon: Juanita Craver, MD;  Location: [facility];  Service: Endoscopy;  Laterality: N/A;   INCISION AND DRAINAGE     bartholin cyst   KNEE SURGERY     bilateral   NASAL SEPTOPLASTY W/ TURBINOPLASTY Bilateral 05/09/2019    Procedure: NASAL SEPTOPLASTY WITH BILATERAL  TURBINATE REDUCTION;  Surgeon: Leta Baptist, MD;  Location: [facility];  Service: ENT;  Laterality: Bilateral;   NECK SURGERY  2019   Cervical   TONSILLECTOMY      There were no vitals filed for this visit.   Subjective Assessment - 05/27/21 0934     Subjective My shoulder has been hurting so I have been walking funny.    Currently in Pain? No/denies                               [facility] Adult PT Treatment/Exercise - 05/27/21 0001       Lumbar Exercises: Stretches   Hip Flexor Stretch Right;Left;2 reps;30 seconds      Manual Therapy   Myofascial Release abdomen around all parts ofthe colon, rectum, bladder - Lt side more than Rt today                     PT Education - 05/27/21 1014     Education Details Access Code: [email or web address]    Person(s) Educated Patient    Methods Explanation;Demonstration;Tactile cues;Verbal cues    Comprehension Verbalized understanding;Returned demonstration  PT Long Term Goals - 05/27/21 0935       PT LONG TERM GOAL #1   Title Pt will be able to have a BM without straining due to abiltiy to coordinate pelvic floor muscles with breathing and core engaged    Baseline still btter a few days after PT then gets a little worse again    Status Partially Met      PT LONG TERM GOAL #2   Title Pt will be ind with advanced HEP    Status Partially Met      PT LONG TERM GOAL #3   Title Pt will be able to perform single leg stand for 8 seconds bil without LOB due to improved hip and core strength and coordination    Status On-going      PT LONG TERM GOAL #4   Title Pt will have at least 1 BM/day and consistancy will be one solid mass diameter of a quarter at least    Baseline more solid and more regular but consistency is still all over the place but is better; have BMs every 4 days or so    Status Partially Met      PT LONG TERM GOAL #5   Title  Pt will be able to empty bladder without straining and produce a normal stream    Baseline this is normal    Status Achieved                   Plan - 05/27/21 1013     Clinical Impression Statement Pt is doing really well with partially met goals.  She still has some fascial restrictions that are being worked out with MFR techniques.  Pt will benefit from skilled PT to re-assess balance and add final exercises to HEP    PT Treatment/Interventions ADLs/Self Care Home Management;Biofeedback;Cryotherapy;Electrical Stimulation;Moist Heat;Therapeutic activities;Therapeutic exercise;Neuromuscular re-education;Patient/family education;Manual techniques;Taping;Dry needling;Passive range of motion    PT Next Visit Plan re-assess balance and review HEP    PT Home Exercise Plan Access Code: [email or web address]    Consulted and Agree with Plan of Care Patient             Patient will benefit from skilled therapeutic intervention in order to improve the following deficits and impairments:  Pain, Postural dysfunction, Impaired flexibility, Increased fascial restricitons, Decreased strength, Decreased coordination, Decreased range of motion, Increased muscle spasms  Visit Diagnosis: Muscle weakness (generalized)  Abnormal posture     Problem List Patient Active Problem List   Diagnosis Date Noted   Cervical spondylosis with radiculopathy 12/07/2017   Chronic pain of left ankle 10/02/2017   Myopic astigmatism of both eyes 11/14/2015   Word finding difficulty 09/10/2015   Cognitive change 09/10/2015   Insulin resistance 36/14/4315   Nonalcoholic fatty liver disease 05/31/2015   UARS (upper airway resistance syndrome) 07/14/2013   Mitral valve prolapse 06/02/2013   Pernicious anemia 06/02/2013   Obesity, Class II, BMI 35-39.9, with comorbidity 06/02/2013   Vitamin D insufficiency 06/02/2013   Obstructive sleep apnea 06/02/2013   Palpitations 06/02/2013   Family history of heart disease  06/02/2013   Blepharitis with rosacea 06/02/2013   Low back pain 06/01/2013   Chalazion of right upper eyelid 12/29/2012   Posterior tibial tendinitis 10/09/2011    Camillo Flaming Maahi Lannan, PT 05/27/2021, 10:16 AM  [facility] @ [place], [place], [address] Phone: [phone]   Fax:  [phone]  Name: Helen Jackson  MRN: [id number] Date of Birth: 1960-12-10   PHYSICAL THERAPY DISCHARGE SUMMARY  Visits from Start of Care: 6  Current functional level related to goals / functional outcomes:   See above goals Remaining deficits: See above details   Education / Equipment: HEP   Patient agrees to discharge. Patient goals were not met. Patient is being discharged due to not returning since the last visit.  Gustavus Bryant, PT 07/15/21 3:17 PM

## 2021-05-27 NOTE — Patient Instructions (Signed)
Access Code: [email or web address] URL: [email or web address] Date: 05/27/2021 Prepared by: Jari Favre  Exercises Supine Figure 4 Piriformis Stretch - 1 x daily - 7 x weekly - 1 sets - 3 reps - 30 sec hold Supine Piriformis Stretch with Foot on Ground - 1 x daily - 7 x weekly - 1 sets - 3 reps - 30 sec hold Supine Pelvic Floor Stretch - 1 x daily - 7 x weekly - 1 sets - 3 reps - 30 sec hold Modified Supine Hamstring Stretch with Doorway - 1 x daily - 7 x weekly - 3 sets - 10 reps Seated Hamstring Stretch - 1 x daily - 7 x weekly - 1 sets - 3 reps - 30 sec hold Seated Thoracic Lumbar Extension - 1 x daily - 7 x weekly - 1 sets - 10 reps - 5 sec hold Shoulder extension with resistance - Neutral - 1 x daily - 7 x weekly - 2 sets - 10 reps Standing Shoulder Flexion with Resistance - 1 x daily - 7 x weekly - 2 sets - 10 reps Sidelying Thoracic Rotation with Open Book - 1 x daily - 7 x weekly - 1 sets - 5 reps - 10 sec hold Hip Flexor Stretch with Chair - 1 x daily - 7 x weekly - 3 reps - 1 sets - 30 sec hold

## 2021-06-03 ENCOUNTER — Encounter: Payer: Self-pay | Admitting: Physical Therapy

## 2021-07-26 ENCOUNTER — Ambulatory Visit: Payer: BC Managed Care – PPO | Admitting: Cardiovascular Disease

## 2021-07-26 ENCOUNTER — Other Ambulatory Visit: Payer: Self-pay

## 2021-07-26 VITALS — BP 127/82 | Ht 63.0 in | Wt 170.8 lb

## 2021-07-26 DIAGNOSIS — E785 Hyperlipidemia, unspecified: Secondary | ICD-10-CM

## 2021-07-26 DIAGNOSIS — I2510 Atherosclerotic heart disease of native coronary artery without angina pectoris: Secondary | ICD-10-CM

## 2021-07-26 DIAGNOSIS — Z8616 Personal history of COVID-19: Secondary | ICD-10-CM

## 2021-07-26 DIAGNOSIS — R002 Palpitations: Secondary | ICD-10-CM | POA: Diagnosis not present

## 2021-07-26 DIAGNOSIS — I2584 Coronary atherosclerosis due to calcified coronary lesion: Secondary | ICD-10-CM

## 2021-07-26 DIAGNOSIS — Z8249 Family history of ischemic heart disease and other diseases of the circulatory system: Secondary | ICD-10-CM

## 2021-07-26 MED ORDER — METOPROLOL SUCCINATE ER 25 MG PO TB24: 25.0000 mg | ORAL_TABLET | Freq: Every day | ORAL | 1 refills | Status: DC

## 2021-07-26 NOTE — Patient Instructions (Addendum)
Medication Instructions:   -Start taking metoprolol succinate 12.5mg  once daily for 7 days, then increase to 25mg  once daily.    *If you need a refill on your cardiac medications before your next appointment, please call your pharmacy*   Follow-Up: At [facility], you and your health needs are our priority.  As part of our continuing mission to provide you with exceptional heart care, we have created designated Provider Care Teams.  These Care Teams include your primary Cardiologist (physician) and Advanced Practice Providers (APPs -  Physician Assistants and Nurse Practitioners) who all work together to provide you with the care you need, when you need it.  We recommend signing up for the patient portal called "MyChart".  Sign up information is provided on this After Visit Summary.  MyChart is used to connect with patients for Virtual Visits (Telemedicine).  Patients are able to view lab/test results, encounter notes, upcoming appointments, etc.  Non-urgent messages can be sent to your provider as well.   To learn more about what you can do with MyChart, go to [email or web address].    Your next appointment:   3 month(s)  The format for your next appointment:   In Person  Provider:   Coletta Memos, FNP, Fabian Sharp, PA-C, Sande Rives, PA-C, Caron Presume, PA-C, Jory Sims, DNP, ANP, or [name], PA-C       Then, [name], MD will plan to see you again in 6 month(s).

## 2021-07-26 NOTE — Progress Notes (Addendum)
Cardiology Office Note    Date:  08/04/2021   ID:  Helen Jackson, DOB 08/29/1960, MRN [id number]  PCP:  [name], MD  Cardiologist:  [name], MD   F/U cardiology evaluation; last see by me in 2019 and by APP in 01/2021.  History of Present Illness:  Helen Jackson is a 61 y.o. female who is scheduled to undergo cervical disc surgery Dr. [name] on Monday, December 07, 2017.  I had seen her remotely in 2015.  She presents to the office today for cardiology evaluation and preoperative clearance.  Helen Jackson has a history of mitral valve prolapse which was diagnosed in her 63s.  She also has a history of pernicious anemia which was diagnosed while in college.  There is family history for premature cardiovascular disease with her mother suffering a stroke in her 78s and a maternal grandmother and mother having a myocardial infarction in their 80s and 58s.   I saw her in 2014 and in July 2015. She had gained over 80 pounds in the 10 years previously.  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  She had noticed episodes of shortness of breath and also had begun to develop some issues with sleep.  She underwent a routine treadmill test which was negative for ischemia and exercise 20.6 met workload achieving a maximal heart rate of 173 bpm.  An echo Doppler study in December 2014 showed an EF at 60 to 29% with systolic bowing of the mitral valve without definitive prolapse.  There was trivial MR.  She had normal diastolic parameters.  She underwent a sleep study which suggested increased upper airway resistance syndrome without definitive sleep apnea.  AHI was 2.0/h, however, her respiratory disturbance index was increased at 13.9/h.  Her lowest oxygen desaturation was 90%.  I had not seen her in almost 5 years and last saw her in June 2019.  At that time she denied any chest pain or significant shortness of breath and was unaware of palpitations.   She has developed  cervical disc disease and is scheduled to undergo C5-6 cervical discectomy with 1 level fusion of the neck by Dr. [name] on December 07, 2017.  She had seen her primary physician's office PA on Monday who provided general pre-operative clearance.  I saw her for preoperative clearance and felt she was cardiovascularly stable to undergo planned surgery.  She had COVID-19 pneumonia in September 2021 but did not require hospitalization but was out of work and felt ill for 5 weeks.  A CTA was performed which revealed two-vessel coronary calcification.  She was seen by [name], NP on Nov 09, 2020 at which time she complained of persistent fatigue since her COVID diagnosis.  She had complaints of palpitations and vague chest discomfort.  Coronary CT and repeat echocardiography were recommended.  Coronary CT done in June 2022 showed minimal to mixed nonobstructive plaque with a calcium score of 147. Her echo Doppler study on December 28, 2020 showed an EF of 60 to 65%.  She had normal diastolic parameters.  There was bowing of her mitral valve leaflets without definitive prolapse.   She saw [name], [place] in follow-up on January 18, 2021 previously she had been found to be anemic but her hemoglobin had normalized.  Was recommended that she be on aspirin and statin but ultimately she was changed to [name] by her primary physician.  Presently, she continues to be on [name]  injection every 2 weeks and takes aspirin 81 mg daily.  Her LDL cholesterol has significantly improved from July 2022 at 87 down to 38 January 2023.  She admits to occasional palpitations.  She has a history of pernicious anemia.  She presents for evaluation.   Past Medical History:  Diagnosis Date   Arthritis    Celiac disease    Colitis    Complication of anesthesia    difficulty waking up   Diverticulosis    Elevated alkaline phosphatase level    Fatty liver    Fibroid    Heart murmur    [name],cardiolgy follows   Lichen planus     MVP (mitral valve prolapse)    Ocular rosacea    Peripheral neuropathy    Pernicious anemia    injections every 4 to 6 weeks -last injestion 09-15-14   Seizures ([place])    see notes in anesthesia   Upper airway resistance syndrome    After sleep study   Upper respiratory disease    upper respiratory airway disease    Past Surgical History:  Procedure Laterality Date   ANKLE SURGERY     CESAREAN SECTION     COLONOSCOPY WITH PROPOFOL N/A 11/28/2014   Procedure: COLONOSCOPY WITH PROPOFOL;  Surgeon: Juanita Craver, MD;  Location: [facility];  Service: Endoscopy;  Laterality: N/A;   ESOPHAGOGASTRODUODENOSCOPY (EGD) WITH PROPOFOL N/A 11/28/2014   Procedure: ESOPHAGOGASTRODUODENOSCOPY (EGD) WITH PROPOFOL;  Surgeon: Juanita Craver, MD;  Location: [facility];  Service: Endoscopy;  Laterality: N/A;   INCISION AND DRAINAGE     bartholin cyst   KNEE SURGERY     bilateral   NASAL SEPTOPLASTY W/ TURBINOPLASTY Bilateral 05/09/2019   Procedure: NASAL SEPTOPLASTY WITH BILATERAL  TURBINATE REDUCTION;  Surgeon: Leta Baptist, MD;  Location: [place];  Service: ENT;  Laterality: Bilateral;   NECK SURGERY  2019   Cervical   TONSILLECTOMY      Current Medications: Outpatient Medications Prior to Visit  Medication Sig Dispense Refill   aspirin EC 81 MG tablet Take 1 tablet (81 mg total) by mouth daily. Swallow whole. 90 tablet 3   Cholecalciferol (VITAMIN D3) 1.25 MG (50000 UT) CAPS Take 50,000 Units by mouth every Sunday.      Cyanocobalamin (B-12) 1000 MCG/ML KIT Inject 1,000 mcg as directed See admin instructions. Every 4-6 weeks     EPIPEN 2-PAK 0.3 MG/0.3ML SOAJ injection Inject 0.3 mg as directed as needed (for allergies/never used). Reported on 08/30/2015     Evolocumab ([name] [place]) Inject into the skin.     metroNIDAZOLE (METROGEL) 1 % gel Apply 1 application topically at bedtime as needed (irritation).      No facility-administered medications prior to visit.     Allergies:    Gentamicin, Influenza vaccines, Other, Almond oil, Amoxicillin, Doxycycline, Eggs or egg-derived products, Hydrocodone-acetaminophen, Ibuprofen, Indomethacin, Morphine, Pyridium [phenazopyridine hcl], Soybean-containing drug products, Soybeans [soybean oil], Tetracyclines & related, Tramadol, Wheat bran, and Erythromycin base   Social History   Socioeconomic History   Marital status: Married    Spouse name: Herbie Baltimore    Number of children: 1   Years of education: 16   Highest education level: Not on file  Occupational History   Occupation: [employer]   Tobacco Use   Smoking status: Never   Smokeless tobacco: Never  Vaping Use   Vaping Use: Never used  Substance and Sexual Activity   Alcohol use: No    Alcohol/week: 0.0 standard drinks  Drug use: No   Sexual activity: Yes    Partners: Male    Birth control/protection: Post-menopausal  Other Topics Concern   Not on file  Social History Narrative   Lives with spouse and son   Caffeine use: 2 to 4 cups    Social Determinants of Health   Financial Resource Strain: Not on file  Food Insecurity: Not on file  Transportation Needs: Not on file  Physical Activity: Not on file  Stress: Not on file  Social Connections: Not on file    Additional social history is that she works as a Pharmacist, 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  She has 1 child now age 58.  Family History:  The patient's family history includes Breast cancer in her paternal aunt; CVA in her mother; Cancer in her father; Chorea in her maternal grandmother; Dementia in her mother; Osteoporosis in her mother; Stroke in her mother.  Mother died at age 31.  Her father died at age 85 and had pancreatic cancer.  ROS General: Negative; No fevers, chills, or night sweats; obesity HEENT: Negative; No changes in vision or hearing, sinus congestion, difficulty swallowing Pulmonary: Negative; No cough, wheezing, shortness of breath,  hemoptysis Cardiovascular: Negative; No chest pain, presyncope, syncope, palpitations GI: Negative; No nausea, vomiting, diarrhea, or abdominal pain GU: Negative; No dysuria, hematuria, or difficulty voiding Musculoskeletal: Neck discomfort Hematologic/Oncology: Negative; no easy bruising, bleeding Endocrine: Negative; no heat/cold intolerance; no diabetes Neuro: Negative; no changes in balance, headaches Skin: Negative; No rashes or skin lesions Psychiatric: Negative; No behavioral problems, depression Sleep: Negative; No snoring, daytime sleepiness, hypersomnolence, bruxism, restless legs, hypnogognic hallucinations, no cataplexy Other comprehensive 14 point system review is negative.   PHYSICAL EXAM:   VS:  BP 127/82    Ht _0  (1.6 m)    Wt 170 lb 12.8 oz (77.5 kg)    LMP 07/17/2012    BMI 30.26 kg/m     Blood pressure by me was 120/78, pulse 67  Wt Readings from Last 3 Encounters:  08/04/21 170 lb 12.8 oz (77.5 kg)  02/26/21 167 lb (75.8 kg)  01/18/21 173 lb 12.8 oz (78.8 kg)     Since 2015, her weight has decreased from 226 pounds  General: Alert, oriented, no distress.  Skin: normal turgor, no rashes, warm and dry HEENT: Normocephalic, atraumatic. Pupils equal round and reactive to light; sclera anicteric; extraocular muscles intact;  Nose without nasal septal hypertrophy Mouth/Parynx benign; Mallinpatti scale 3 Neck: No JVD, no carotid bruits; normal carotid upstroke Lungs: clear to ausculatation and percussion; no wheezing or rales Chest wall: without tenderness to palpitation Heart: PMI not displaced, rate in the 60s with frequent ectopy, s1 s2 normal, 1/6 systolic murmur, no diastolic murmur, no rubs, gallops, thrills, or heaves Abdomen: soft, nontender; no hepatosplenomehaly, BS+; abdominal aorta nontender and not dilated by palpation. Back: no CVA tenderness Pulses 2+ Musculoskeletal: full range of motion, normal strength, no joint deformities Extremities: no  clubbing cyanosis or edema, Homan's sign negative  Neurologic: grossly nonfocal; Cranial nerves grossly wnl Psychologic: Normal mood and affect   Studies/Labs Reviewed:   July 26, 2021 ECG (independently read by me): NSR at 67, frequent PVCs with transient bigeminy  June 2019 EKG: She had a preoperative ECG done at [facility] this week as part of her preoperative laboratory and ECG assessment.  The report states that this is entirely normal. I was unable to view the ECG personally.  Recent Labs: BMP Latest Ref Rng &  Units 02/15/2020 01/08/2017 02/24/2014  Glucose 70 - 99 mg/dL 122(H) 89 88  BUN 6 - 20 mg/dL _0 Creatinine 0.44 - 1.00 mg/dL 0.78 0.67 0.67  BUN/Creat Ratio 9 - 23 - 22 -  Sodium 135 - 145 mmol/L 139 141 141  Potassium 3.5 - 5.1 mmol/L 4.0 4.6 4.8  Chloride 98 - 111 mmol/L 104 104 106  CO2 22 - 32 mmol/L _1 Calcium 8.9 - 10.3 mg/dL 9.0 8.9 9.1     Hepatic Function Latest Ref Rng & Units 11/19/2020 02/15/2020 01/28/2017  Total Protein 6.0 - 8.5 g/dL 6.9 7.0 -  Albumin 3.8 - 4.9 g/dL 4.2 3.3(L) -  AST 0 - 40 IU/L 21 54(H) -  ALT 0 - 32 IU/L 16 41 56(H)  Alk Phosphatase 44 - 121 IU/L 83 57 -  Total Bilirubin 0.0 - 1.2 mg/dL 0.5 0.9 -  Bilirubin, Direct 0.00 - 0.40 mg/dL 0.14 - -    CBC Latest Ref Rng & Units 02/15/2020 01/08/2017 10/11/2013  WBC 4.0 - 10.5 K/uL 3.8(L) 6.4 7.7  Hemoglobin 12.0 - 15.0 g/dL 15.2(H) 13.8 14.5  Hematocrit 36.0 - 46.0 % 46.3(H) 41.5 43.2  Platelets 150 - 400 K/uL 134(L) 229 219   Lab Results  Component Value Date   MCV 103.3 (H) 02/15/2020   MCV 100 (H) 01/08/2017   MCV 99.8 10/11/2013   Lab Results  Component Value Date   TSH 3.160 01/08/2017   No results found for: HGBA1C   BNP No results found for: BNP  ProBNP No results found for: PROBNP   Lipid Panel     Component Value Date/Time   CHOL 161 11/19/2020 0803   TRIG 77 11/19/2020 0803   HDL 53 11/19/2020 0803   CHOLHDL 3.0 11/19/2020 0803   CHOLHDL 3.7  10/11/2013 0819   VLDL 18 10/11/2013 0819   LDLCALC 93 11/19/2020 0803     RADIOLOGY: No results found.   Additional studies/ records that were reviewed today include:  I reviewed the patient's prior echo Doppler study, stress test, and previous evaluations with me.  I reviewed her preoperative laboratory and ECG report.   ASSESSMENT:    1. 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 Coronary artery calcification: 147 calcium score   3. Hyperlipidemia LDL goal <70   4. Palpitations   5. History of COVID-19   6. Family history of heart disease     PLAN:  Helen Jackson is a 61 year-old female who has a history of remotely diagnosed mitral valve prolapse and family history for premature cardiovascular disease.  Remotely, she had experienced exertional shortness of breath and vague chest pain.  A treadmill test was negative for ischemia.  An echo Doppler study has shown normal systolic function and normal diastolic parameters and although there was systolic bowing of her mitral valve there was no definitive prolapse.  Remotely she was morbidly obese with a peak weight of 226 pounds reduced to 170.  A prior sleep study showed increased upper airway resistance without definitive sleep apnea.  Her blood pressure today is stable.  She is now on [name] injection for hyperlipidemia and LDL has been reduced down to 38 in January 2023.  She has noticed palpitations and on exam today ectopy was noted and on ECG there were frequent PVCs with transient bigeminy.  I have recommended she initiate metoprolol succinate and take 12.5 mg for the first  week and then increase this to 25 mg.  She sees Dr. Ardeth Perfect for primary care who checks laboratory.  I have recommended that she have follow-up evaluation with one of our APP's in 3 months and see me in 6 months for reevaluation.   Medication Adjustments/Labs and Tests Ordered: Current medicines  are reviewed at length with the patient today.  Concerns regarding medicines are outlined above.  Medication changes, Labs and Tests ordered today are listed in the Patient Instructions below. Patient Instructions  Medication Instructions:   -Start taking metoprolol succinate 12.65m once daily for 7 days, then increase to 273monce daily.    *If you need a refill on your cardiac medications before your next appointment, please call your pharmacy*   Follow-Up: [facility] and your health needs are our priority.  As part of our continuing mission to provide you with exceptional heart care, we have created designated Provider Care Teams.  These Care Teams include your primary Cardiologist (physician) and Advanced Practice Providers (APPs -  Physician Assistants and Nurse Practitioners) who all work together to provide you with the care you need, when you need it.  We recommend signing up for the patient portal called "MyChart".  Sign up information is provided on this After Visit Summary.  MyChart is used to connect with patients for Virtual Visits (Telemedicine).  Patients are able to view lab/test results, encounter notes, upcoming appointments, etc.  Non-urgent messages can be sent to your provider as well.   To learn more about what you can do with MyChart, go to [email or web address]   Your next appointment:   3 month(s)  The format for your next appointment:   In Person  Provider:   JeColetta MemosFNP, AnFabian SharpPA-C, CaSande RivesPA-C, JeCaron PresumePA-C, KaJory SimsDNP, ANP, or HaAlmyra DeforestPA-C       Then, ThShelva MajesticMD will plan to see you again in 6 month(s).   Signed, ThShelva MajesticMD  08/04/2021 12:24 PM    [place] Glen[address] StreetSuBloomingtonGrStitesNC  [id number]: [phone]

## 2021-08-04 ENCOUNTER — Encounter: Payer: Self-pay | Admitting: Cardiovascular Disease

## 2021-10-21 ENCOUNTER — Other Ambulatory Visit: Payer: Self-pay | Admitting: Registered Nurse

## 2021-10-21 DIAGNOSIS — R590 Localized enlarged lymph nodes: Secondary | ICD-10-CM

## 2021-10-21 DIAGNOSIS — R599 Enlarged lymph nodes, unspecified: Secondary | ICD-10-CM

## 2021-10-24 ENCOUNTER — Ambulatory Visit
Admission: RE | Admit: 2021-10-24 | Discharge: 2021-10-24 | Disposition: A | Discharge status: Home / Self Care | Payer: BC Managed Care – PPO | Source: Ambulatory Visit | Attending: Registered Nurse | Admitting: Registered Nurse

## 2021-10-24 DIAGNOSIS — R599 Enlarged lymph nodes, unspecified: Secondary | ICD-10-CM

## 2021-10-24 DIAGNOSIS — R590 Localized enlarged lymph nodes: Secondary | ICD-10-CM

## 2021-10-24 MED ORDER — IOPAMIDOL (ISOVUE-300) INJECTION 61%
75.0000 mL | Freq: Once | INTRAVENOUS | Status: AC | PRN
  Administered 2021-10-24: 75 mL via INTRAVENOUS

## 2021-11-01 ENCOUNTER — Ambulatory Visit: Payer: BC Managed Care – PPO | Admitting: Student

## 2021-11-01 ENCOUNTER — Ambulatory Visit: Payer: BC Managed Care – PPO | Admitting: Nurse Practitioner

## 2021-11-01 ENCOUNTER — Encounter: Payer: Self-pay | Admitting: Nurse Practitioner

## 2021-11-01 VITALS — BP 110/70 | HR 62 | Resp 20 | Ht 63.0 in | Wt 176.4 lb

## 2021-11-01 DIAGNOSIS — R42 Dizziness and giddiness: Secondary | ICD-10-CM | POA: Diagnosis not present

## 2021-11-01 DIAGNOSIS — I493 Ventricular premature depolarization: Secondary | ICD-10-CM | POA: Diagnosis not present

## 2021-11-01 DIAGNOSIS — I2510 Atherosclerotic heart disease of native coronary artery without angina pectoris: Secondary | ICD-10-CM

## 2021-11-01 DIAGNOSIS — I10 Essential (primary) hypertension: Secondary | ICD-10-CM | POA: Diagnosis not present

## 2021-11-01 DIAGNOSIS — R002 Palpitations: Secondary | ICD-10-CM

## 2021-11-01 DIAGNOSIS — I341 Nonrheumatic mitral (valve) prolapse: Secondary | ICD-10-CM

## 2021-11-01 DIAGNOSIS — E785 Hyperlipidemia, unspecified: Secondary | ICD-10-CM

## 2021-11-01 NOTE — Patient Instructions (Signed)
Medication Instructions:  Your physician recommends that you continue on your current medications as directed. Please refer to the Current Medication list given to you today.   *If you need a refill on your cardiac medications before your next appointment, please call your pharmacy*   Lab Work: NONE ordered at this time of appointment   If you have labs (blood work) drawn today and your tests are completely normal, you will receive your results only by: [place] (if you have MyChart) OR A paper copy in the mail If you have any lab test that is abnormal or we need to change your treatment, we will call you to review the results.   Testing/Procedures: NONE ordered at this time of appointment     Follow-Up: At [facility], you and your health needs are our priority.  As part of our continuing mission to provide you with exceptional heart care, we have created designated Provider Care Teams.  These Care Teams include your primary Cardiologist (physician) and Advanced Practice Providers (APPs -  Physician Assistants and Nurse Practitioners) who all work together to provide you with the care you need, when you need it.  We recommend signing up for the patient portal called "MyChart".  Sign up information is provided on this After Visit Summary.  MyChart is used to connect with patients for Virtual Visits (Telemedicine).  Patients are able to view lab/test results, encounter notes, upcoming appointments, etc.  Non-urgent messages can be sent to your provider as well.   To learn more about what you can do with MyChart, go to [email or web address].    Your next appointment:   6 month(s)  The format for your next appointment:   In Person  Provider:   [name], MD     Other Instructions   Important Information About Sugar

## 2021-11-01 NOTE — Progress Notes (Signed)
Office Visit    Patient Name: Helen Jackson Date of Encounter: 11/01/2021  Primary Care Provider:  [name], MD Primary Cardiologist:  [name], MD  Chief Complaint    61 year old female with a history of nonobstructive CAD, mitral valve prolapse, hyperlipidemia palpitations, PVCs, cervical disc disease with surgical repair, nonalcoholic liver disease, COVID pneumonia, sicca syndrome, autoimmune diathesis, and obesity who presents for follow-up related to palpitations.   Past Medical History    Past Medical History:  Diagnosis Date   Arthritis    Celiac disease    Colitis    Complication of anesthesia    difficulty waking up   Diverticulosis    Elevated alkaline phosphatase level    Fatty liver    Fibroid    Heart murmur    Dr. Kelly,cardiolgy follows   Lichen planus    MVP (mitral valve prolapse)    Ocular rosacea    Peripheral neuropathy    Pernicious anemia    injections every 4 to 6 weeks -last injestion 09-15-14   Seizures ([place])    see notes in anesthesia   Upper airway resistance syndrome    After sleep study   Upper respiratory disease    upper respiratory airway disease   Past Surgical History:  Procedure Laterality Date   ANKLE SURGERY     CESAREAN SECTION     COLONOSCOPY WITH PROPOFOL N/A 11/28/2014   Procedure: COLONOSCOPY WITH PROPOFOL;  Surgeon: Juanita Craver, MD;  Location: [facility];  Service: Endoscopy;  Laterality: N/A;   ESOPHAGOGASTRODUODENOSCOPY (EGD) WITH PROPOFOL N/A 11/28/2014   Procedure: ESOPHAGOGASTRODUODENOSCOPY (EGD) WITH PROPOFOL;  Surgeon: Juanita Craver, MD;  Location: [facility];  Service: Endoscopy;  Laterality: N/A;   INCISION AND DRAINAGE     bartholin cyst   KNEE SURGERY     bilateral   NASAL SEPTOPLASTY W/ TURBINOPLASTY Bilateral 05/09/2019   Procedure: NASAL SEPTOPLASTY WITH BILATERAL  TURBINATE REDUCTION;  Surgeon: Leta Baptist, MD;  Location: [place];  Service: ENT;  Laterality: Bilateral;    NECK SURGERY  2019   Cervical   TONSILLECTOMY      Allergies  Allergies  Allergen Reactions   Gentamicin Anaphylaxis   Influenza Vaccines Anaphylaxis   Other Anaphylaxis    Almond-   Almond Oil     almonds   Amoxicillin Hives   Doxycycline     Cant remember   Eggs Or Egg-Derived Products Nausea And Vomiting    11-20-14 Pt. States "had anaphylaxis with flu vaccine shot with egg based"   Hydrocodone-Acetaminophen Nausea And Vomiting    VIOLENTLY SHAKES   Ibuprofen Itching   Indomethacin Other (See Comments)    Stomach pain & black stool   Morphine Nausea And Vomiting and Other (See Comments)    VIOLENTLY SHAKES   Pyridium [Phenazopyridine Hcl]     Cant remember   Soybean-Containing Drug Products Other (See Comments)    Swelling of the tongue   Soybeans [Soybean Oil]    Tetracyclines & Related Hives    swelling   Tramadol Itching    severe   Wheat Bran     Cant remember   Erythromycin Base Rash    History of Present Illness    61 year old female with the above past medical history including nonobstructive CAD, mitral valve prolapse, hyperlipidemia palpitations, PVCs, cervical disc disease with surgical repair, nonalcoholic liver disease, COVID pneumonia, sicca syndrome, autoimmune diathesis, and obesity.  She has a strong family history of premature CAD.  Her mother had a stroke in her 63s and her grandmother and mother both had MI in their 103s.  Previous treadmill stress test was negative.  Echocardiogram in December 2014 showed EF 60 to 65%, with bowing of the mitral valve without definitive prolapse, trivial MR, mild diastolic parameters.  Sleep study showed increased without definitive sleep apnea. Coronary CTA in 2021 showed two-vessel coronary artery calcification.  He complained of persistent fatigue at a visit in May 2022.  Repeat coronary CT angiogram in June 2022 revealed minimal to mild mixed nonobstructive CAD, coronary calcium score 147, 93rd percentile for age  and sex matched control, aggressive risk factor modification was advised.  Echocardiogram at the time showed EF 60 to 65%, bowing of the mitral valve leaflet without true mitral valve prolapse, otherwise no significant valve issue.  She was last seen in the office on 07/26/2021 and reported occasional palpitations.  EKG showed frequent PVCs with transient bigeminy.  She was started on metoprolol. She presents today for follow-up.  Since her last visit she has been stable overall from a cardiac standpoint.  She has noted some increased tiredness since starting metoprolol, particularly since increasing her dose to 25 mg daily, however, she states it is tolerable and not limiting her daily activities. She notes that her palpitations have improved with the addition of metoprolol. She does note occasional lightheadedness, particularly when taking deep breaths, however, this is not new.  She denies any symptoms concerning for angina, denies shortness of breath.  Overall, she thinks that she has improved though she continues to monitor her symptoms closely.  Home Medications    Current Outpatient Medications  Medication Sig Dispense Refill   aspirin EC 81 MG tablet Take 1 tablet (81 mg total) by mouth daily. Swallow whole. 90 tablet 3   Cholecalciferol (VITAMIN D3) 1.25 MG (50000 UT) CAPS Take 50,000 Units by mouth every Sunday.      Cyanocobalamin (B-12) 1000 MCG/ML KIT Inject 1,000 mcg as directed See admin instructions. Every 4-6 weeks     EPIPEN 2-PAK 0.3 MG/0.3ML SOAJ injection Inject 0.3 mg as directed as needed (for allergies/never used). Reported on 08/30/2015     Evolocumab ([name] [place]) Inject into the skin.     metoprolol succinate (TOPROL-XL) 25 MG 24 hr tablet Take 1 tablet (25 mg total) by mouth daily. Take with or immediately following a meal. 90 tablet 1   metroNIDAZOLE (METROGEL) 1 % gel Apply 1 application topically at bedtime as needed (irritation).      No current facility-administered  medications for this visit.     Review of Systems    She denies chest pain, dyspnea, pnd, orthopnea, n, v, dizziness, syncope, edema, weight gain, or early satiety. All other systems reviewed and are otherwise negative except as noted above.   Physical Exam    VS:  BP 110/70 (BP Location: Left Arm, Patient Position: Sitting, Cuff Size: Normal)   Pulse 62   Resp 20   Ht _0  (1.6 m)   Wt 176 lb 6.4 oz (80 kg)   LMP 07/17/2012   SpO2 99%   BMI 31.25 kg/m    GEN: Well nourished, well developed, in no acute distress. HEENT: normal. Neck: Supple, no JVD, carotid bruits, or masses. Cardiac: RRR, no murmurs, rubs, or gallops. No clubbing, cyanosis, edema.  Radials/DP/PT 2+ and equal bilaterally.  Respiratory:  Respirations regular and unlabored, clear to auscultation bilaterally. GI: Soft, nontender, nondistended, BS + x 4. MS: no deformity or atrophy.  Skin: warm and dry, no rash. Neuro:  Strength and sensation are intact. Psych: Normal affect.  Accessory Clinical Findings    ECG personally reviewed by me today - No EKG in office today.  Lab Results  Component Value Date   WBC 3.8 (L) 02/15/2020   HGB 15.2 (H) 02/15/2020   HCT 46.3 (H) 02/15/2020   MCV 103.3 (H) 02/15/2020   PLT 134 (L) 02/15/2020   Lab Results  Component Value Date   CREATININE 0.78 02/15/2020   BUN 6 02/15/2020   NA 139 02/15/2020   K 4.0 02/15/2020   CL 104 02/15/2020   CO2 26 02/15/2020   Lab Results  Component Value Date   ALT 16 11/19/2020   AST 21 11/19/2020   ALKPHOS 83 11/19/2020   BILITOT 0.5 11/19/2020   Lab Results  Component Value Date   CHOL 161 11/19/2020   HDL 53 11/19/2020   LDLCALC 93 11/19/2020   TRIG 77 11/19/2020   CHOLHDL 3.0 11/19/2020    No results found for: HGBA1C  Assessment & Plan   1. Palpitations/PVCs/bigeminy/lightheadedness: Most recent echocardiogram as below. She does note occasional palpitations, however, these have largely improved with metoprolol.   She does note occasional lightheadedness, particularly when taking deep breaths.  She denies any other associated symptoms-her lightheadedness resolves spontaneously.  This is not new for her. Discussed possible outpatient cardiac monitor, however, patient states she would prefer to monitor her symptoms and if her symptoms worsen, she would consider outpatient monitor at that time. She denies dyspnea, presyncope, syncope.  Continue metoprolol.  2. Nonobstructive CAD: Most recent coronary CT angiogram in June 2022 revealed minimal to mild mixed nonobstructive CAD, coronary calcium score 147, 93rd percentile for age and sex matched control, aggressive risk factor modification was advised. Stable with no anginal symptoms. No indication for ischemic evaluation. Continue preventative medical therapy with aspirin, and [name].  3. H/o mitral valve prolapse: Most recent echo in June 2022 showed EF 60 to 65%, bowing of the mitral valve leaflet without true mitral valve prolapse, otherwise no significant valve issue. Euvolemic and well compensated on exam.  Consider repeat echocardiogram as needed.    4. Hyperlipidemia: LDL was 38 in 06/2021.  Continue aspirin, [name].  5. Disposition: Follow-up in 6 months, sooner if needed.   Lenna Sciara, NP 11/01/2021, 10:03 AM

## 2022-01-16 ENCOUNTER — Other Ambulatory Visit: Payer: Self-pay

## 2022-01-16 MED ORDER — METOPROLOL SUCCINATE ER 25 MG PO TB24: 25.0000 mg | ORAL_TABLET | Freq: Every day | ORAL | 1 refills | Status: DC

## 2022-04-22 ENCOUNTER — Ambulatory Visit: Payer: BC Managed Care – PPO | Attending: Cardiovascular Disease | Admitting: Cardiovascular Disease

## 2022-04-22 ENCOUNTER — Encounter: Payer: Self-pay | Admitting: Cardiovascular Disease

## 2022-04-22 VITALS — BP 122/72 | HR 68 | Ht 64.0 in | Wt 179.8 lb

## 2022-04-22 DIAGNOSIS — E785 Hyperlipidemia, unspecified: Secondary | ICD-10-CM | POA: Diagnosis not present

## 2022-04-22 DIAGNOSIS — I2510 Atherosclerotic heart disease of native coronary artery without angina pectoris: Secondary | ICD-10-CM | POA: Diagnosis not present

## 2022-04-22 DIAGNOSIS — I2584 Coronary atherosclerosis due to calcified coronary lesion: Secondary | ICD-10-CM

## 2022-04-22 DIAGNOSIS — I10 Essential (primary) hypertension: Secondary | ICD-10-CM | POA: Diagnosis not present

## 2022-04-22 DIAGNOSIS — R002 Palpitations: Secondary | ICD-10-CM | POA: Diagnosis not present

## 2022-04-22 DIAGNOSIS — E669 Obesity, unspecified: Secondary | ICD-10-CM

## 2022-04-22 DIAGNOSIS — I493 Ventricular premature depolarization: Secondary | ICD-10-CM

## 2022-04-22 NOTE — Patient Instructions (Signed)
Medication Instructions:  The current medical regimen is effective;  continue present plan and medications.  *If you need a refill on your cardiac medications before your next appointment, please call your pharmacy*  Follow-Up: At [facility], you and your health needs are our priority.  As part of our continuing mission to provide you with exceptional heart care, we have created designated Provider Care Teams.  These Care Teams include your primary Cardiologist (physician) and Advanced Practice Providers (APPs -  Physician Assistants and Nurse Practitioners) who all work together to provide you with the care you need, when you need it.  We recommend signing up for the patient portal called "MyChart".  Sign up information is provided on this After Visit Summary.  MyChart is used to connect with patients for Virtual Visits (Telemedicine).  Patients are able to view lab/test results, encounter notes, upcoming appointments, etc.  Non-urgent messages can be sent to your provider as well.   To learn more about what you can do with MyChart, go to [email or web address].    Your next appointment:   12 month(s)  The format for your next appointment:   In Person  Provider:   [name], MD

## 2022-04-22 NOTE — Progress Notes (Signed)
Cardiology Office Note    Date:  04/22/2022   ID:  Helen Jackson, DOB [birth year], MRN [id number]  PCP:  [name], MD  Cardiologist:  [name], MD   F/U cardiology evaluation;  History of Present Illness:  Helen Jackson is a 61 y.o. female who is followed by Dr. Anastasio Champion for primary care.  I last saw her on July 26, 2021 for follow-up cardiology evaluation.  Helen Jackson has a history of mitral valve prolapse which was diagnosed in her 44s.  She also has a history of pernicious anemia which was diagnosed while in college.  There is family history for premature cardiovascular disease with her mother suffering a stroke in her 78s and a maternal grandmother and mother having a myocardial infarction in their 37s and 75s.   I saw her in 2014 and in July 2015. She had gained over 80 pounds in the 10 years previously.  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  She had noticed episodes of shortness of breath and also had begun to develop some issues with sleep.  She underwent a routine treadmill test which was negative for ischemia and exercise 20.6 met workload achieving a maximal heart rate of 173 bpm.  An echo Doppler study in December 2014 showed an EF at 60 to 46% with systolic bowing of the mitral valve without definitive prolapse.  There was trivial MR.  She had normal diastolic parameters.  She underwent a sleep study which suggested increased upper airway resistance syndrome without definitive sleep apnea.  AHI was 2.0/h, however, her respiratory disturbance index was increased at 13.9/h.  Her lowest oxygen desaturation was 90%.  I had not seen her in almost 5 years and last saw her in June 2019.  At that time she denied any chest pain or significant shortness of breath and was unaware of palpitations.   She has developed cervical disc disease and is scheduled to undergo C5-6 cervical discectomy with 1 level fusion of the neck by Dr. [name] on December 07, 2017.  She had seen her primary physician's office PA on Monday who provided general pre-operative clearance.  I saw her for preoperative clearance and felt she was cardiovascularly stable to undergo planned surgery.  She had COVID-19 pneumonia in September 2021 but did not require hospitalization but was out of work and felt ill for 5 weeks.  A CTA was performed which revealed two-vessel coronary calcification.  She was seen by [name], NP on Nov 09, 2020 at which time she complained of persistent fatigue since her COVID diagnosis.  She had complaints of palpitations and vague chest discomfort.  Coronary CT and repeat echocardiography were recommended.  Coronary CT done in June 2022 showed minimal to mixed nonobstructive plaque with a calcium score of 147.  Her echo Doppler study on December 28, 2020 showed an EF of 60 to 65%.  She had normal diastolic parameters.  There was bowing of her mitral valve leaflets without definitive prolapse.   She saw [name], [place] in follow-up on January 18, 2021 previously she had been found to be anemic but her hemoglobin had normalized.  Was recommended that she be on aspirin and statin but ultimately she was changed to [place] by her primary physician.  Last saw her on July 26, 2021 at which time she was on [name] injection every 2 weeks and takes aspirin 81 mg daily.  Her LDL cholesterol has significantly improved from July 2022 at 87 down to 38 January 2023.  She admits to occasional palpitations.  She has a history of pernicious anemia.    Her, she has done well.  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  She also recently moved.  4 weeks ago, she fell over an open drawer and developed laceration of her thigh and bruising of her left lower leg which has improved.  Chest pain or shortness of breath.  She is sleeping approximately 8 to 9 hours per night but  still notes some fatigue.  Her sleep is more restorative.  She denies recent palpitations.  She admits to a recent 79 pound weight gain following her recent move.  She has not been exercising regularly.  She denies chest pain or shortness of breath.  She have a B12 injection later today at [place].  She presents for follow-up Cardiologic evaluation.  Past Medical History:  Diagnosis Date   Arthritis    Celiac disease    Colitis    Complication of anesthesia    difficulty waking up   Diverticulosis    Elevated alkaline phosphatase level    Fatty liver    Fibroid    Heart murmur    Dr. Isha Seefeld,cardiolgy follows   Lichen planus    MVP (mitral valve prolapse)    Ocular rosacea    Peripheral neuropathy    Pernicious anemia    injections every 4 to 6 weeks -last injestion 09-15-14   Seizures ([place])    see notes in anesthesia   Upper airway resistance syndrome    After sleep study   Upper respiratory disease    upper respiratory airway disease    Past Surgical History:  Procedure Laterality Date   ANKLE SURGERY     CESAREAN SECTION     COLONOSCOPY WITH PROPOFOL N/A 11/28/2014   Procedure: COLONOSCOPY WITH PROPOFOL;  Surgeon: Juanita Craver, MD;  Location: [facility];  Service: Endoscopy;  Laterality: N/A;   ESOPHAGOGASTRODUODENOSCOPY (EGD) WITH PROPOFOL N/A 11/28/2014   Procedure: ESOPHAGOGASTRODUODENOSCOPY (EGD) WITH PROPOFOL;  Surgeon: Juanita Craver, MD;  Location: [facility];  Service: Endoscopy;  Laterality: N/A;   INCISION AND DRAINAGE     bartholin cyst   KNEE SURGERY     bilateral   NASAL SEPTOPLASTY W/ TURBINOPLASTY Bilateral 05/09/2019   Procedure: NASAL SEPTOPLASTY WITH BILATERAL  TURBINATE REDUCTION;  Surgeon: Leta Baptist, MD;  Location: [facility];  Service: ENT;  Laterality: Bilateral;   NECK SURGERY  2019   Cervical   TONSILLECTOMY      Current Medications: Outpatient Medications Prior to Visit  Medication Sig Dispense Refill   aspirin EC 81 MG  tablet Take 1 tablet (81 mg total) by mouth daily. Swallow whole. 90 tablet 3   Cholecalciferol (VITAMIN D3) 1.25 MG (50000 UT) CAPS Take 50,000 Units by mouth every Sunday.      Cyanocobalamin (B-12) 1000 MCG/ML KIT Inject 1,000 mcg as directed See admin instructions. Every 4-6 weeks     EPIPEN 2-PAK 0.3 MG/0.3ML SOAJ injection Inject 0.3 mg as directed as needed (for allergies/never used). Reported on 08/30/2015     Evolocumab ([name] [place]) Inject into the skin.     metoprolol succinate (TOPROL-XL) 25 MG 24 hr tablet Take 1 tablet (25 mg total) by mouth daily. Take with or immediately following a meal. 90 tablet 1  metroNIDAZOLE (METROGEL) 1 % gel Apply 1 application topically at bedtime as needed (irritation).      No facility-administered medications prior to visit.     Allergies:   Gentamicin, Influenza vaccines, Other, Almond oil, Amoxicillin, Doxycycline, Eggs or egg-derived products, Hydrocodone-acetaminophen, Ibuprofen, Indomethacin, Morphine, Pyridium [phenazopyridine hcl], Soybean-containing drug products, Soybeans [soybean oil], Tetracyclines & related, Tramadol, Wheat bran, and Erythromycin base   Social History   Socioeconomic History   Marital status: Married    Spouse name: Herbie Baltimore    Number of children: 1   Years of education: 16   Highest education level: Not on file  Occupational History   Occupation: [employer]   Tobacco Use   Smoking status: Never   Smokeless tobacco: Never  Vaping Use   Vaping Use: Never used  Substance and Sexual Activity   Alcohol use: No    Alcohol/week: 0.0 standard drinks of alcohol   Drug use: No   Sexual activity: Yes    Partners: Male    Birth control/protection: Post-menopausal  Other Topics Concern   Not on file  Social History Narrative   Lives with spouse and son   Caffeine use: 2 to 4 cups    Social Determinants of Health   Financial Resource Strain: Not on file  Food Insecurity: Not on file   Transportation Needs: Not on file  Physical Activity: Not on file  Stress: Not on file  Social Connections: Not on file    Additional social history is that she previously worked as a Pharmacist, 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  She has 1 son now age 52.  She recently switched jobs and now works for [employer].  Family History:  The patient's family history includes Breast cancer in her paternal aunt; CVA in her mother; Cancer in her father; Chorea in her maternal grandmother; Dementia in her mother; Osteoporosis in her mother; Stroke in her mother.  Mother died at age 50.  Her father died at age 68 and had pancreatic cancer.  ROS General: Negative; No fevers, chills, or night sweats; obesity HEENT: Negative; No changes in vision or hearing, sinus congestion, difficulty swallowing Pulmonary: Negative; No cough, wheezing, shortness of breath, hemoptysis Cardiovascular: Negative; No chest pain, presyncope, syncope, palpitations GI: Negative; No nausea, vomiting, diarrhea, or abdominal pain GU: Negative; No dysuria, hematuria, or difficulty voiding Musculoskeletal: Neck discomfort Hematologic/Oncology: Negative; no easy bruising, bleeding Endocrine: Negative; no heat/cold intolerance; no diabetes Neuro: Negative; no changes in balance, headaches Skin: Negative; No rashes or skin lesions Psychiatric: Negative; No behavioral problems, depression Sleep: Negative; No snoring, daytime sleepiness, hypersomnolence, bruxism, restless legs, hypnogognic hallucinations, no cataplexy Other comprehensive 14 point system review is negative.   PHYSICAL EXAM:   VS:  BP 122/72 (BP Location: Left Arm, Patient Position: Sitting)   Pulse 68   Ht _0  (1.626 m)   Wt 179 lb 12.8 oz (81.6 kg)   LMP 07/17/2012   SpO2 100%   BMI 30.86 kg/m     Repeat blood pressure by me was 120/68 supine and 124/70 standing  Wt Readings from Last 3 Encounters:  04/22/22 179 lb 12.8 oz  (81.6 kg)  11/01/21 176 lb 6.4 oz (80 kg)  08/04/21 170 lb 12.8 oz (77.5 kg)   General: Alert, oriented, no distress.  Skin: normal turgor, no rashes, warm and dry HEENT: Normocephalic, atraumatic. Pupils equal round and reactive to light; sclera anicteric; extraocular muscles intact;  Nose without nasal septal hypertrophy Mouth/Parynx  benign; Mallinpatti scale 3 Neck: No JVD, no carotid bruits; normal carotid upstroke Lungs: clear to ausculatation and percussion; no wheezing or rales Chest wall: without tenderness to palpitation Heart: PMI not displaced, RRR, s1 s2 normal, 1/6 systolic murmur, no diastolic murmur, no rubs, gallops, thrills, or heaves Abdomen: soft, nontender; no hepatosplenomehaly, BS+; abdominal aorta nontender and not dilated by palpation. Back: no CVA tenderness Pulses 2+ Musculoskeletal: full range of motion, normal strength, no joint deformities Extremities: Band-Aid on left lower leg at site of recent;  no clubbing cyanosis or edema, Homan's sign negative  Neurologic: grossly nonfocal; Cranial nerves grossly wnl Psychologic: Normal mood and affect     G Studies/Labs Reviewed:   April 22, 2022 ECG (independently read by me): NSR at 68, no ectopy  July 26, 2021 ECG (independently read by me): NSR at 67, frequent PVCs with transient bigeminy  June 2019 EKG: She had a preoperative ECG done at [facility] this week as part of her preoperative laboratory and ECG assessment.  The report states that this is entirely normal. I was unable to view the ECG personally.  Recent Labs:    Latest Ref Rng & Units 02/15/2020   10:09 AM 01/08/2017    9:06 AM 02/24/2014    8:32 AM  BMP  Glucose 70 - 99 mg/dL 122  89  88   BUN 6 - 20 mg/dL _0 Creatinine 0.44 - 1.00 mg/dL 0.78  0.67  0.67   BUN/Creat Ratio 9 - 23  22    Sodium 135 - 145 mmol/L 139  141  141   Potassium 3.5 - 5.1 mmol/L 4.0  4.6  4.8   Chloride 98 - 111 mmol/L 104  104  106   CO2 22 - 32 mmol/L  _1 Calcium 8.9 - 10.3 mg/dL 9.0  8.9  9.1         Latest Ref Rng & Units 11/19/2020    8:02 AM 02/15/2020   10:09 AM 01/28/2017    8:29 AM  Hepatic Function  Total Protein 6.0 - 8.5 g/dL 6.9  7.0    Albumin 3.8 - 4.9 g/dL 4.2  3.3    AST 0 - 40 IU/L 21  54    ALT 0 - 32 IU/L 16  41  56   Alk Phosphatase 44 - 121 IU/L 83  57    Total Bilirubin 0.0 - 1.2 mg/dL 0.5  0.9    Bilirubin, Direct 0.00 - 0.40 mg/dL 0.14          Latest Ref Rng & Units 02/15/2020   10:09 AM 01/08/2017    9:06 AM 10/11/2013    8:19 AM  CBC  WBC 4.0 - 10.5 K/uL 3.8  6.4  7.7   Hemoglobin 12.0 - 15.0 g/dL 15.2  13.8  14.5   Hematocrit 36.0 - 46.0 % 46.3  41.5  43.2   Platelets 150 - 400 K/uL 134  229  219    Lab Results  Component Value Date   MCV 103.3 (H) 02/15/2020   MCV 100 (H) 01/08/2017   MCV 99.8 10/11/2013   Lab Results  Component Value Date   TSH 3.160 01/08/2017   No results found for: "HGBA1C"   BNP No results found for: "BNP"  ProBNP No results found for: "PROBNP"   Lipid Panel     Component Value Date/Time   CHOL 161 11/19/2020 0803   TRIG 77  11/19/2020 0803   HDL 53 11/19/2020 0803   CHOLHDL 3.0 11/19/2020 0803   CHOLHDL 3.7 10/11/2013 0819   VLDL 18 10/11/2013 0819   LDLCALC 93 11/19/2020 0803     RADIOLOGY: No results found.   Additional studies/ records that were reviewed today include:  I reviewed the patient's prior echo Doppler study, stress test, and previous evaluations with me.  I reviewed her preoperative laboratory and ECG report.   ASSESSMENT:    1. Essential hypertension   2. Palpitations   3. Coronary artery calcification: 147 calcium score   4. Nonobstructive atherosclerosis of coronary artery   5. Hyperlipidemia LDL goal <70   6. PVC's (premature ventricular contractions): resolved   7. Mild obesity     PLAN:  Helen Jackson is a 61 year-old female who has a history of remotely diagnosed mitral valve prolapse and family history  for premature cardiovascular disease.  Remotely, she had experienced exertional shortness of breath and vague chest pain.  A treadmill test was negative for ischemia.  Her echo Doppler study has shown normal systolic function and normal diastolic parameters and although there was systolic bowing of her mitral valve there was no definitive prolapse.  Remotely she was morbidly obese with a peak weight of 226 pounds reduced to 170.  A prior sleep study showed increased upper airway resistance without definitive sleep apnea.  Presently, she is sleeping 8 to 9 hours per night and does admit to some fatigue.  However she believes her sleep is good and restorative.  Denies any chest pain or shortness of breath.  Since her last evaluation her weight has increased from 170 up to 179 and she admits that she has not been as active.  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  He continues to undergo B12 injections and is scheduled to undergo this today at [facility].  Reviewed recent laboratory from July 2022.  She is tolerating [name] and LDL cholesterol is now decreased to a level of 15 total cholesterol 86 and HDL 62 and triglycerides 47 on [name].  She sees Dr. Arlyss Repress where they are for primary care.  Cardiovascularly she is stable.  He continues to be on metoprolol succinate 25 mg daily and she is unaware of recent ectopy.  Remotely she had been documented to have frequent PVCs with transient bigeminy.  I will see her in 1 year for follow-up Cardiologic evaluation or sooner as needed.      Medication Adjustments/Labs and Tests Ordered: Current medicines are reviewed at length with the patient today.  Concerns regarding medicines are outlined above.  Medication changes, Labs and Tests ordered today are listed in the Patient Instructions below. Patient Instructions  Medication Instructions:  The current medical regimen is effective;   continue present plan and medications.  *If you need a refill on your cardiac medications before your next appointment, please call your pharmacy*  Follow-Up: At [facility], you and your health needs are our priority.  As part of our continuing mission to provide you with exceptional heart care, we have created designated Provider Care Teams.  These Care Teams include your primary Cardiologist (physician) and Advanced Practice Providers (APPs -  Physician Assistants and Nurse Practitioners) who all work together to provide you with the care you need, when you need it.  We recommend signing up for the patient portal called "MyChart".  Sign up information  is provided on this After Visit Summary.  MyChart is used to connect with patients for Virtual Visits (Telemedicine).  Patients are able to view lab/test results, encounter notes, upcoming appointments, etc.  Non-urgent messages can be sent to your provider as well.   To learn more about what you can do with MyChart, go to [email or web address].    Your next appointment:   12 month(s)  The format for your next appointment:   In Person  Provider:   [name], MD          Signed, [name], MD  04/22/2022 8:56 AM    Helen Jackson [address], [place], [place], [place]  [address] Phone: [phone]

## 2022-05-06 ENCOUNTER — Ambulatory Visit: Payer: BC Managed Care – PPO | Admitting: Nurse Practitioner

## 2022-05-06 DIAGNOSIS — Z0289 Encounter for other administrative examinations: Secondary | ICD-10-CM

## 2022-05-06 NOTE — Progress Notes (Deleted)
   MEKALA WINGER Oct 26, 1960 [id number]   History:  61 y.o. G1P1001 presents for annual exam. Postmenopausal - no HRT, no bleeding. Normal pap and mammogram history. Pelvic floor PT recommended last year for inability to fully empty bladder. MVP managed by cardiology.   Gynecologic History Patient's last menstrual period was 07/17/2012.   Contraception/Family planning: post menopausal status Sexually active: Yes  Health Maintenance Last Pap: 01/08/2017. Results were: Normal neg HPV Last mammogram: 11/2020. Results were: Normal Last colonoscopy: 07/2020. 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 Results were: Normal  Past medical history, past surgical history, family history and social history were all reviewed and documented in the EPIC chart. 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   ROS:  A ROS was performed and pertinent positives and negatives are included.  Exam:  There were no vitals filed for this visit.  There is no height or weight on file to calculate BMI.  General appearance:  Normal Thyroid:  Symmetrical, normal in size, without palpable masses or nodularity. Respiratory  Auscultation:  Clear without wheezing or rhonchi Cardiovascular  Auscultation:  Regularly irregular without rubs, murmurs or gallops  Edema/varicosities:  Not grossly evident Abdominal  Soft,nontender, without masses, guarding or rebound.  Liver/spleen:  No organomegaly noted  Hernia:  None appreciated  Skin  Inspection:  Grossly normal Breasts: Examined lying and sitting.   Right: Without masses, retractions, nipple discharge or axillary adenopathy.   Left: Without masses, retractions, nipple discharge or axillary adenopathy. Genitourinary   Inguinal/mons:  Normal without inguinal adenopathy  External genitalia:  Normal appearing vulva with no masses, tenderness, or lesions  BUS/Urethra/Skene's glands:  Normal  Vagina:  1st degree uterine  prolapse  Cervix:  Normal appearing without discharge or lesions  Uterus:  Normal in size, shape and contour, nontender  Adnexa/parametria:     Rt: Normal in size, without masses or tenderness.   Lt: Normal in size, without masses or tenderness.  Anus and perineum: Normal  Digital rectal exam: Declines  Patient informed chaperone available to be present for breast and pelvic exam. Patient has requested no chaperone to be present. Patient has been advised what will be completed during breast and pelvic exam.   Assessment/Plan:  61 y.o. G1P1001 for annual exam.   Well female exam with routine gynecological exam - Education provided on SBEs, importance of preventative screenings, current guidelines, high calcium diet, regular exercise, and multivitamin daily. Labs with PCP.   Postmenopausal - no HRT, no bleeding   Screening for cervical cancer - Normal Pap history.  Will repeat at 5-year interval per guidelines.  Screening for breast cancer - Normal mammogram history.  Continue annual screenings.  Normal breast exam today.  Screening for colon cancer - 07/2020 colonoscopy due to problem. Will repeat at GI's recommended interval.   Screening for osteoporosis - Normal Dexa in 2020 per patient. PCP manages.    Return in 1 year for annual.   Tamela Gammon DNP, 7:39 AM 05/06/2022

## 2022-05-27 IMAGING — DX DG CHEST 1V PORT
1 series · 1 of 1 positions shown · non-contrast
Comparison: None.

CLINICAL DATA: Shortness of breath.  [id number] positive.

EXAM:
PORTABLE CHEST 1 VIEW

[chest ap]
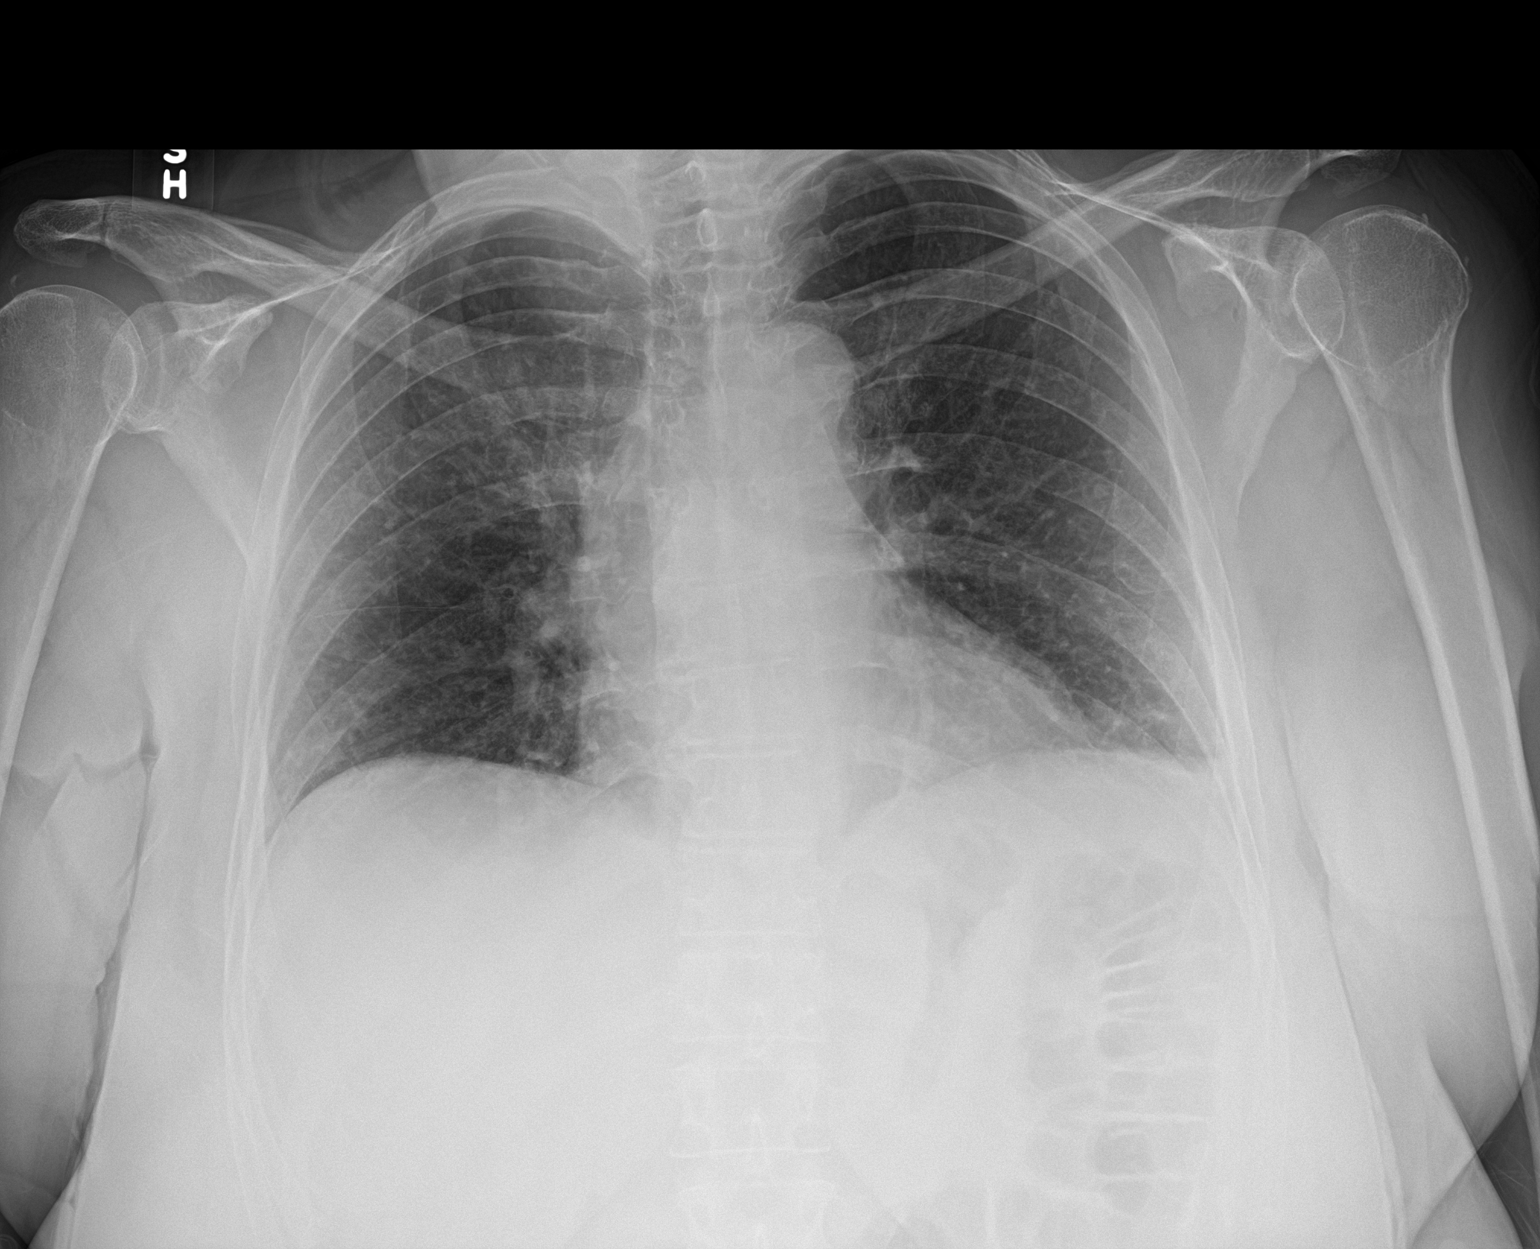

[1 of 1 positions shown; findings below may reference images not displayed]

FINDINGS: The heart size and mediastinal contours are within normal limits.
Right lung is clear. Minimal left basilar subsegmental atelectasis
or infiltrate is noted. No pneumothorax or pleural effusion is
noted. The visualized skeletal structures are unremarkable.
IMPRESSION: Minimal left basilar subsegmental atelectasis or infiltrate is
noted.

## 2022-08-14 ENCOUNTER — Telehealth: Payer: Self-pay | Admitting: Cardiovascular Disease

## 2022-08-14 NOTE — Telephone Encounter (Signed)
STAT if patient feels like he/she is going to faint   Are you dizzy now?  No, but she states she has been having dizziness for the past few weeks and yesterday it progressed  Do you feel faint or have you passed out?  No   Do you have any other symptoms?  Pressure in right arm   Have you checked your HR and BP (record if available)?  2/28: 114/60

## 2022-08-14 NOTE — Telephone Encounter (Signed)
Called patient back to assess symptoms, no answer. Left detailed message per DPR asking patient to call back if symptoms were stable. Advised patient to call 911 if symptoms were escalating or uncontrolled, if she was feeling SOB or experiencing chest pain.

## 2022-10-02 NOTE — Therapy (Signed)
OUTPATIENT PHYSICAL THERAPY VESTIBULAR EVALUATION     Patient Name: Helen Jackson MRN: [id number] DOB:[birth year], 62 y.o., female Today's Date: 10/03/2022  END OF SESSION:  PT End of Session - 10/03/22 0901     Visit Number 1    Number of Visits 17    Date for PT Re-Evaluation 11/28/22    Authorization Type BCBS    Authorization - Number of Visits 30   combined   PT Start Time 0803    PT Stop Time 0844    PT Time Calculation (min) 41 min    Equipment Utilized During Treatment Gait belt    Activity Tolerance Patient tolerated treatment well    Behavior During Therapy WFL for tasks assessed/performed             Past Medical History:  Diagnosis Date   Arthritis    Celiac disease    Colitis    Complication of anesthesia    difficulty waking up   Diverticulosis    Elevated alkaline phosphatase level    Fatty liver    Fibroid    Heart murmur    Dr. Beatris Si follows   Lichen planus    MVP (mitral valve prolapse)    Ocular rosacea    Peripheral neuropathy    Pernicious anemia    injections every 4 to 6 weeks -last injestion 09-15-14   Seizures    see notes in anesthesia   Upper airway resistance syndrome    After sleep study   Upper respiratory disease    upper respiratory airway disease   Past Surgical History:  Procedure Laterality Date   ANKLE SURGERY     CESAREAN SECTION     COLONOSCOPY WITH PROPOFOL N/A 11/28/2014   Procedure: COLONOSCOPY WITH PROPOFOL;  Surgeon: Charna Elizabeth, MD;  Location: [facility];  Service: Endoscopy;  Laterality: N/A;   ESOPHAGOGASTRODUODENOSCOPY (EGD) WITH PROPOFOL N/A 11/28/2014   Procedure: ESOPHAGOGASTRODUODENOSCOPY (EGD) WITH PROPOFOL;  Surgeon: Charna Elizabeth, MD;  Location: [facility];  Service: Endoscopy;  Laterality: N/A;   INCISION AND DRAINAGE     bartholin cyst   KNEE SURGERY     bilateral   NASAL SEPTOPLASTY W/ TURBINOPLASTY Bilateral 05/09/2019   Procedure: NASAL SEPTOPLASTY WITH BILATERAL  TURBINATE  REDUCTION;  Surgeon: [name], MD;  Location: [facility];  Service: ENT;  Laterality: Bilateral;   NECK SURGERY  2019   Cervical   TONSILLECTOMY     Patient Active Problem List   Diagnosis Date Noted   Cervical spondylosis with radiculopathy 12/07/2017   Chronic pain of left ankle 10/02/2017   Myopic astigmatism of both eyes 11/14/2015   Word finding difficulty 09/10/2015   Cognitive change 09/10/2015   Insulin resistance 05/31/2015   Nonalcoholic fatty liver disease 05/31/2015   UARS (upper airway resistance syndrome) 07/14/2013   Mitral valve prolapse 06/02/2013   Pernicious anemia 06/02/2013   Obesity, Class II, BMI 35-39.9, with comorbidity 06/02/2013   Vitamin D insufficiency 06/02/2013   Obstructive sleep apnea 06/02/2013   Palpitations 06/02/2013   Family history of heart disease 06/02/2013   Blepharitis with rosacea 06/02/2013   Low back pain 06/01/2013   Chalazion of right upper eyelid 12/29/2012   Posterior tibial tendinitis 10/09/2011    PCP: [name], MD  REFERRING PROVIDER: [name], MD  REFERRING DIAG: R42 (ICD-10-CM) - Dizziness  THERAPY DIAG:  Chronic nonintractable headache, unspecified headache type  Dizziness and giddiness  Unsteadiness on feet  ONSET DATE: 2023  Rationale for Evaluation  and Treatment: Rehabilitation  SUBJECTIVE:   SUBJECTIVE STATEMENT: Patient reports bad vertigo in the fall of 2023. Having more and more dizzy spells and "HA that's not a HA." Episodes include symptoms such as: nausea, dizziness, floaters, feeling like she's going to fall backwards, blurry vision, fatigue, tingling on the top of the head, changes in grammar and speech. Also notes worse motion sickness with head movements, bending over, head movements when driving not just riding in the car. Reports that she was diagnosed with migraines vs. cluster HAs in her 30s or 40s. Not on migraine medication. Denies double vision or hearing changes.     Pt accompanied by: self  PERTINENT HISTORY: Peripheral neuropathy, seizures, ankle surgery, B knee surgery, migraines  Per Dr. Suszanne Conners- "her findings were suggestive of vestibular migraine, with poor balancing function"  PAIN:  Are you having pain? No  PRECAUTIONS: None  WEIGHT BEARING RESTRICTIONS: No  FALLS: Has patient fallen in last 6 months? No  LIVING ENVIRONMENT: Lives with: lives with their spouse Lives in: House/apartment Stairs:  3 steps to enter without handrail; 1 story home Has following equipment at home: Single point cane  PLOF: Independent; working full time in an office   PATIENT GOALS:   OBJECTIVE:   DIAGNOSTIC FINDINGS: none recent  COGNITION: Overall cognitive status: Within functional limits for tasks assessed   SENSATION: Reports hx of N/T d/t pernicious anemia    POSTURE:  rounded shoulders and elevated shoulders    LOWER EXTREMITY MMT:   MMT Right eval Left eval  Hip flexion    Hip abduction    Hip adduction    Hip internal rotation    Hip external rotation    Knee flexion    Knee extension    Ankle dorsiflexion    Ankle plantarflexion    Ankle inversion    Ankle eversion    (Blank rows = not tested)  GAIT: Gait pattern:  slight anterior trunk lean and hip instability  Assistive device utilized: None Level of assistance: SBA     M-CTSIB  Condition 1: Firm Surface, EO 20 Sec, Mild and Moderate Sway  Condition 2: Firm Surface, EC 3 Sec, Severe Sway; L LOB  Condition 3: Foam Surface, EO - Sec,  NT d/t safety  Sway  Condition 4: Foam Surface, EC - Sec, NT d/t safety Sway  *patient also with L LOB while standing in between tests   PATIENT SURVEYS:  FOTO NT  VESTIBULAR ASSESSMENT:  GENERAL OBSERVATION: wears glasses for astigmatism - reading and driving   OCULOMOTOR EXAM:  Ocular Alignment:  L esotropia   Ocular ROM: No Limitations  Spontaneous Nystagmus: absent  Gaze-Induced Nystagmus: absent  Smooth Pursuits:   1 saccade in each direction and c/o eye and head pain/fatigue  Saccades: intact; c/o dizzy and floaters  Convergence/Divergence: did not report double vision but c/o eye pain/fatigue  VESTIBULAR - OCULAR REFLEX:   Slow VOR: Normal slow and guarded; c/o dizzy and uncomfortable; worse vertical vs. Horizontal   VOR Cancellation: Normal; c/o more intense dizziness and nausea  Head-Impulse Test: HIT Right: positive HIT Left: positive C/o HA after completing this test       POSITIONAL TESTING:  Right Roll Test: negative Left Roll Test: negative Right Sidelying: negative; mild dizziness upon sitting up Left Sidelying: negative; significant dizziness upon sitting up      VESTIBULAR TREATMENT:  DATE: 10/03/22   PATIENT EDUCATION: Education details: prognosis, POC, HEP- to be performed in corner/counter for safety; educated on need for medical management of migraines while in PT for max benefit Person educated: Patient Education method: Explanation, Demonstration, Tactile cues, Verbal cues, and Handouts Education comprehension: verbalized understanding and returned demonstration  HOME EXERCISE PROGRAM: Access Code: [email or web address] URL: [email or web address] Date: 10/03/2022 Prepared by: [facility] - Outpatient  Rehab - [facility]  Exercises - Brandt-Daroff Vestibular Exercise  - 1 x daily - 5 x weekly - 2 sets - 3-5 reps - Seated Left Head Turns Vestibular Habituation  - 1 x daily - 5 x weekly - 2 sets - 30 sec hold - Seated Head Nod  - 1 x daily - 5 x weekly - 2 sets - 30 sec hold - Narrow Stance with Counter Support  - 1 x daily - 5 x weekly - 2 sets - 30 sec hold  GOALS: Goals reviewed with patient? Yes  SHORT TERM GOALS: Target date: 10/31/2022  Patient to be independent with initial HEP. Baseline: HEP initiated Goal status: INITIAL    LONG TERM GOALS: Target  date: 11/28/2022  Patient to be independent with advanced HEP. Baseline: Not yet initiated  Goal status: INITIAL  Patient to report 0/10 dizziness with standing vertical and horizontal VOR for 30 seconds. Baseline: Unable Goal status: INITIAL  Patient will report 0/10 dizziness with bed mobility.  Baseline: Symptomatic  Goal status: INITIAL  Patient to demonstrate mild-moderate sway with M-CTSIB condition with eyes open/foam surface in order to improve safety in environments with uneven surfaces. Baseline: NT d/t safety Goal status: INITIAL  Patient to score at least 20/24 on DGI in order to decrease risk of falls. Baseline: NT Goal status: INITIAL  FOTO goal to be made based on initial score.  Baseline: NT Goal status: INITIAL  Patient to report tolerance for work day with <4/10 symptoms.  Baseline: unable  Goal status: INITIAL    ASSESSMENT:  CLINICAL IMPRESSION:   Patient is a 62 y/o F presenting to OPPT with c/o dizziness worsening since fall 2023. Diagnosed with vestibular migraine by Dr. Suszanne Conners. Patient is not currently on migraine meds- this was recommended today. Patient today presented with rounded posture, gait deviations, poor balance, L esotropia, saccade with smooth pursuit, positive B HIT, symptoms throughout exam, and motion sensitivity from L>R side. Patient was educated on gentle VOR and habituation HEP and reported understanding. Would benefit from skilled PT services 2x/week for 8 weeks to address aforementioned impairments in order to optimize level of function.    OBJECTIVE IMPAIRMENTS: Abnormal gait, decreased activity tolerance, decreased balance, dizziness, improper body mechanics, postural dysfunction, and pain.   ACTIVITY LIMITATIONS: carrying, lifting, bending, standing, squatting, sleeping, stairs, transfers, bed mobility, bathing, and reach over head  PARTICIPATION LIMITATIONS: meal prep, cleaning, laundry, driving, shopping, community activity,  occupation, yard work, and church  PERSONAL FACTORS: Age, Past/current experiences, Time since onset of injury/illness/exacerbation, Transportation, and 3+ comorbidities: Peripheral neuropathy, seizures, ankle surgery, B knee surgery, migraines   are also affecting patient's functional outcome.   REHAB POTENTIAL: Good  CLINICAL DECISION MAKING: Evolving/moderate complexity  EVALUATION COMPLEXITY: Moderate   PLAN:  PT FREQUENCY: 2x/week  PT DURATION: 8 weeks  PLANNED INTERVENTIONS: Therapeutic exercises, Therapeutic activity, Neuromuscular re-education, Balance training, Gait training, Patient/Family education, Self Care, Joint mobilization, Stair training, Vestibular training, Canalith repositioning, Aquatic Therapy, Dry Needling, Cryotherapy, Moist heat, Taping, Manual therapy, and Re-evaluation  PLAN FOR NEXT SESSION: FOTO & make goal,  DGI, review HEP and progress habituation, multisensory balance (start small), and VOR     Anette Guarneri, PT, DPT 10/03/22 9:47 AM  [facility] at [facility] [address] [place], [address] [place], [place] [address] Phone # [phone] Fax # [phone]

## 2022-10-03 ENCOUNTER — Ambulatory Visit: Payer: BC Managed Care – PPO | Attending: Otolaryngology | Admitting: Physical Therapy

## 2022-10-03 ENCOUNTER — Encounter: Payer: Self-pay | Admitting: Physical Therapy

## 2022-10-03 ENCOUNTER — Other Ambulatory Visit: Payer: Self-pay

## 2022-10-03 DIAGNOSIS — G8929 Other chronic pain: Secondary | ICD-10-CM | POA: Diagnosis present

## 2022-10-03 DIAGNOSIS — R519 Headache, unspecified: Secondary | ICD-10-CM | POA: Diagnosis not present

## 2022-10-03 DIAGNOSIS — R42 Dizziness and giddiness: Secondary | ICD-10-CM | POA: Diagnosis present

## 2022-10-03 DIAGNOSIS — R2681 Unsteadiness on feet: Secondary | ICD-10-CM | POA: Diagnosis present

## 2022-10-07 NOTE — Therapy (Signed)
OUTPATIENT PHYSICAL THERAPY VESTIBULAR TREATMENT     Patient Name: Helen Jackson MRN: [id number] DOB:May 08, 1961, 62 y.o., female Today's Date: 10/08/2022  END OF SESSION:  PT End of Session - 10/08/22 0842     Visit Number 2    Number of Visits 17    Date for PT Re-Evaluation 11/28/22    Authorization Type BCBS    Authorization - Number of Visits 30   combined   PT Start Time 0802    PT Stop Time 0844    PT Time Calculation (min) 42 min    Equipment Utilized During Treatment Gait belt    Activity Tolerance Patient tolerated treatment well;Other (comment)   limited by dizziness and migraine sx   Behavior During Therapy WFL for tasks assessed/performed              Past Medical History:  Diagnosis Date   Arthritis    Celiac disease    Colitis    Complication of anesthesia    difficulty waking up   Diverticulosis    Elevated alkaline phosphatase level    Fatty liver    Fibroid    Heart murmur    Dr. Beatris Si follows   Lichen planus    MVP (mitral valve prolapse)    Ocular rosacea    Peripheral neuropathy    Pernicious anemia    injections every 4 to 6 weeks -last injestion 09-15-14   Seizures    see notes in anesthesia   Upper airway resistance syndrome    After sleep study   Upper respiratory disease    upper respiratory airway disease   Past Surgical History:  Procedure Laterality Date   ANKLE SURGERY     CESAREAN SECTION     COLONOSCOPY WITH PROPOFOL N/A 11/28/2014   Procedure: COLONOSCOPY WITH PROPOFOL;  Surgeon: Charna Elizabeth, MD;  Location: [facility];  Service: Endoscopy;  Laterality: N/A;   ESOPHAGOGASTRODUODENOSCOPY (EGD) WITH PROPOFOL N/A 11/28/2014   Procedure: ESOPHAGOGASTRODUODENOSCOPY (EGD) WITH PROPOFOL;  Surgeon: Charna Elizabeth, MD;  Location: [facility];  Service: Endoscopy;  Laterality: N/A;   INCISION AND DRAINAGE     bartholin cyst   KNEE SURGERY     bilateral   NASAL SEPTOPLASTY W/ TURBINOPLASTY Bilateral 05/09/2019    Procedure: NASAL SEPTOPLASTY WITH BILATERAL  TURBINATE REDUCTION;  Surgeon: [name], MD;  Location: [facility];  Service: ENT;  Laterality: Bilateral;   NECK SURGERY  2019   Cervical   TONSILLECTOMY     Patient Active Problem List   Diagnosis Date Noted   Cervical spondylosis with radiculopathy 12/07/2017   Chronic pain of left ankle 10/02/2017   Myopic astigmatism of both eyes 11/14/2015   Word finding difficulty 09/10/2015   Cognitive change 09/10/2015   Insulin resistance 05/31/2015   Nonalcoholic fatty liver disease 05/31/2015   UARS (upper airway resistance syndrome) 07/14/2013   Mitral valve prolapse 06/02/2013   Pernicious anemia 06/02/2013   Obesity, Class II, BMI 35-39.9, with comorbidity 06/02/2013   Vitamin D insufficiency 06/02/2013   Obstructive sleep apnea 06/02/2013   Palpitations 06/02/2013   Family history of heart disease 06/02/2013   Blepharitis with rosacea 06/02/2013   Low back pain 06/01/2013   Chalazion of right upper eyelid 12/29/2012   Posterior tibial tendinitis 10/09/2011    PCP: [name], MD  REFERRING PROVIDER: [name], MD  REFERRING DIAG: R42 (ICD-10-CM) - Dizziness  THERAPY DIAG:  Chronic nonintractable headache, unspecified headache type  Dizziness and giddiness  Unsteadiness on  feet  ONSET DATE: 2023  Rationale for Evaluation and Treatment: Rehabilitation  SUBJECTIVE:   SUBJECTIVE STATEMENT: Woke up 2x during the night, very dizzy and nauseous. Reports that she did her exercises over the weekend and I think it made it worse. PCP appointment today for a treatment plan.    Pt accompanied by: self  PERTINENT HISTORY: Peripheral neuropathy, seizures, ankle surgery, B knee surgery, migraines  Per Dr. Suszanne Conners- "her findings were suggestive of vestibular migraine, with poor balancing function"  PAIN:  Are you having pain? No  PRECAUTIONS: None  WEIGHT BEARING RESTRICTIONS: No  FALLS: Has patient fallen in  last 6 months? No  LIVING ENVIRONMENT: Lives with: lives with their spouse Lives in: House/apartment Stairs:  3 steps to enter without handrail; 1 story home Has following equipment at home: Single point cane  PLOF: Independent; working full time in an office   PATIENT GOALS:   OBJECTIVE:       TODAY'S TREATMENT: 10/07/22 Activity Comments  FOTO [phone]  DGI 11  Gait training with trekking pole Good sequencing and pt noting improved stability   brandt daroff R/L 2x C/o 6/10 dizziness upon sitting up from R, 8.5/10 from L; encouraged to slow down which improved symptoms slightly on the R  R/L elbow prop 2x each Edu on slow speed to allow mild sx only. However pt reported HA, "grey haze over eyes" and dizziness worse than brandt daroff       [facility] PT Assessment - 10/08/22 0001       Standardized Balance Assessment   Standardized Balance Assessment Dynamic Gait Index      Dynamic Gait Index   Level Surface Normal    Change in Gait Speed Mild Impairment    Gait with Horizontal Head Turns Severe Impairment    Gait with Vertical Head Turns Severe Impairment    Gait and Pivot Turn Moderate Impairment    Step Over Obstacle Mild Impairment    Step Around Obstacles Moderate Impairment    Steps Mild Impairment    Total Score 11    DGI comment: c/o floaters, nausea, and N/T over her head               PATIENT EDUCATION: Education details: explained that vestibular exercises may bring on symptoms in the short term for benefit in the long term; edu on fall risk and benefit of trekking pole; cueing for wider BOS when standing d/t frequent LOB with static standing Person educated: Patient Education method: Explanation, Demonstration, Tactile cues, Verbal cues, and Handouts Education comprehension: verbalized understanding and returned demonstration    Below measures were taken at time of initial evaluation unless otherwise specified:   DIAGNOSTIC FINDINGS: none  recent  COGNITION: Overall cognitive status: Within functional limits for tasks assessed   SENSATION: Reports hx of N/T d/t pernicious anemia    POSTURE:  rounded shoulders and elevated shoulders    LOWER EXTREMITY MMT:   MMT Right eval Left eval  Hip flexion    Hip abduction    Hip adduction    Hip internal rotation    Hip external rotation    Knee flexion    Knee extension    Ankle dorsiflexion    Ankle plantarflexion    Ankle inversion    Ankle eversion    (Blank rows = not tested)  GAIT: Gait pattern:  slight anterior trunk lean and hip instability  Assistive device utilized: None Level of assistance: SBA     M-CTSIB  Condition 1: Firm Surface, EO 20 Sec, Mild and Moderate Sway  Condition 2: Firm Surface, EC 3 Sec, Severe Sway; L LOB  Condition 3: Foam Surface, EO - Sec,  NT d/t safety  Sway  Condition 4: Foam Surface, EC - Sec, NT d/t safety Sway  *patient also with L LOB while standing in between tests   PATIENT SURVEYS:  FOTO NT  VESTIBULAR ASSESSMENT:  GENERAL OBSERVATION: wears glasses for astigmatism - reading and driving   OCULOMOTOR EXAM:  Ocular Alignment:  L esotropia   Ocular ROM: No Limitations  Spontaneous Nystagmus: absent  Gaze-Induced Nystagmus: absent  Smooth Pursuits:  1 saccade in each direction and c/o eye and head pain/fatigue  Saccades: intact; c/o dizzy and floaters  Convergence/Divergence: did not report double vision but c/o eye pain/fatigue  VESTIBULAR - OCULAR REFLEX:   Slow VOR: Normal slow and guarded; c/o dizzy and uncomfortable; worse vertical vs. Horizontal   VOR Cancellation: Normal; c/o more intense dizziness and nausea  Head-Impulse Test: HIT Right: positive HIT Left: positive C/o HA after completing this test       POSITIONAL TESTING:  Right Roll Test: negative Left Roll Test: negative Right Sidelying: negative; mild dizziness upon sitting up Left Sidelying: negative; significant dizziness upon sitting  up      VESTIBULAR TREATMENT:                                                                                                   DATE: 10/03/22   PATIENT EDUCATION: Education details: prognosis, POC, HEP- to be performed in corner/counter for safety; educated on need for medical management of migraines while in PT for max benefit Person educated: Patient Education method: Explanation, Demonstration, Tactile cues, Verbal cues, and Handouts Education comprehension: verbalized understanding and returned demonstration  HOME EXERCISE PROGRAM: Access Code: [email or web address] URL: [email or web address] Date: 10/03/2022 Prepared by: [facility] - Outpatient  Rehab - [facility]  Exercises - Brandt-Daroff Vestibular Exercise  - 1 x daily - 5 x weekly - 2 sets - 3-5 reps - Seated Left Head Turns Vestibular Habituation  - 1 x daily - 5 x weekly - 2 sets - 30 sec hold - Seated Head Nod  - 1 x daily - 5 x weekly - 2 sets - 30 sec hold - Narrow Stance with Counter Support  - 1 x daily - 5 x weekly - 2 sets - 30 sec hold  GOALS: Goals reviewed with patient? Yes  SHORT TERM GOALS: Target date: 10/31/2022  Patient to be independent with initial HEP. Baseline: HEP initiated Goal status: IN PROGRESS    LONG TERM GOALS: Target date: 11/28/2022  Patient to be independent with advanced HEP. Baseline: Not yet initiated  Goal status: IN PROGRESS  Patient to report 0/10 dizziness with standing vertical and horizontal VOR for 30 seconds. Baseline: Unable Goal status: IN PROGRESS  Patient will report 0/10 dizziness with bed mobility.  Baseline: Symptomatic  Goal status: IN PROGRESS  Patient to demonstrate mild-moderate sway with M-CTSIB condition with eyes open/foam surface in order to improve safety in environments with uneven surfaces.  Baseline: NT d/t safety Goal status: IN PROGRESS  Patient to score at least 20/24 on DGI in order to decrease risk of falls. Baseline: 11 Goal  status: IN PROGRESS  Patient to score at least 54 on FOTO to indicate improved functional activity tolerance.  Baseline: 39 Goal status: IN PROGRESS  Patient to report tolerance for work day with <4/10 symptoms.  Baseline: unable  Goal status: IN PROGRESS    ASSESSMENT:  CLINICAL IMPRESSION:  Patient arrived to session without new complaints. Reports that she is scheduled to see her PCP today to address migraine tx plan. Patient scored 11/24, indicating an increased risk of falls. Initiated gait training with trekking pole for improved stability which patient tolerated well and noted improved stability. Patient with poor tolerance of habituation exercises even when instructed to perform at slow speed. Patient reported HA, dizziness, and "gray haze over eyes" at end of session, left over from last activity. Encouraged sitting rest break in waiting room- patient did so and left when she felt safe to do so.   OBJECTIVE IMPAIRMENTS: Abnormal gait, decreased activity tolerance, decreased balance, dizziness, improper body mechanics, postural dysfunction, and pain.   ACTIVITY LIMITATIONS: carrying, lifting, bending, standing, squatting, sleeping, stairs, transfers, bed mobility, bathing, and reach over head  PARTICIPATION LIMITATIONS: meal prep, cleaning, laundry, driving, shopping, community activity, occupation, yard work, and church  PERSONAL FACTORS: Age, Past/current experiences, Time since onset of injury/illness/exacerbation, Transportation, and 3+ comorbidities: Peripheral neuropathy, seizures, ankle surgery, B knee surgery, migraines   are also affecting patient's functional outcome.   REHAB POTENTIAL: Good  CLINICAL DECISION MAKING: Evolving/moderate complexity  EVALUATION COMPLEXITY: Moderate   PLAN:  PT FREQUENCY: 2x/week  PT DURATION: 8 weeks  PLANNED INTERVENTIONS: Therapeutic exercises, Therapeutic activity, Neuromuscular re-education, Balance training, Gait training,  Patient/Family education, Self Care, Joint mobilization, Stair training, Vestibular training, Canalith repositioning, Aquatic Therapy, Dry Needling, Cryotherapy, Moist heat, Taping, Manual therapy, and Re-evaluation  PLAN FOR NEXT SESSION: review HEP and progress habituation, multisensory balance (start small), and VOR     Anette Guarneri, PT, DPT 10/08/22 9:31 AM  [facility] at [facility] [address] [place], [place] [address] Phone # [phone] Fax # [phone]

## 2022-10-08 ENCOUNTER — Encounter: Payer: Self-pay | Admitting: Physical Therapy

## 2022-10-08 ENCOUNTER — Ambulatory Visit: Payer: BC Managed Care – PPO | Admitting: Physical Therapy

## 2022-10-08 DIAGNOSIS — R2681 Unsteadiness on feet: Secondary | ICD-10-CM

## 2022-10-08 DIAGNOSIS — R42 Dizziness and giddiness: Secondary | ICD-10-CM

## 2022-10-08 DIAGNOSIS — R519 Headache, unspecified: Secondary | ICD-10-CM | POA: Diagnosis not present

## 2022-10-08 DIAGNOSIS — G8929 Other chronic pain: Secondary | ICD-10-CM

## 2022-10-10 ENCOUNTER — Ambulatory Visit: Payer: BC Managed Care – PPO | Admitting: Physical Therapy

## 2022-10-10 NOTE — Therapy (Signed)
OUTPATIENT PHYSICAL THERAPY VESTIBULAR TREATMENT     Patient Name: Helen Jackson MRN: [id number] DOB:1960-12-31, 62 y.o., female Today's Date: 10/13/2022  END OF SESSION:  PT End of Session - 10/13/22 1656     Visit Number 3    Number of Visits 17    Date for PT Re-Evaluation 11/28/22    Authorization Type [employer]    Authorization - Number of Visits 30   combined   PT Start Time 1619    PT Stop Time 1655    PT Time Calculation (min) 36 min    Equipment Utilized During Treatment Gait belt    Activity Tolerance Other (comment)   limited by dizziness and migraine sx   Behavior During Therapy WFL for tasks assessed/performed               Past Medical History:  Diagnosis Date   Arthritis    Celiac disease    Colitis    Complication of anesthesia    difficulty waking up   Diverticulosis    Elevated alkaline phosphatase level    Fatty liver    Fibroid    Heart murmur    Dr. Beatris Si follows   Lichen planus    MVP (mitral valve prolapse)    Ocular rosacea    Peripheral neuropathy    Pernicious anemia    injections every 4 to 6 weeks -last injestion 09-15-14   Seizures (HCC)    see notes in anesthesia   Upper airway resistance syndrome    After sleep study   Upper respiratory disease    upper respiratory airway disease   Past Surgical History:  Procedure Laterality Date   ANKLE SURGERY     CESAREAN SECTION     COLONOSCOPY WITH PROPOFOL N/A 11/28/2014   Procedure: COLONOSCOPY WITH PROPOFOL;  Surgeon: Charna Elizabeth, MD;  Location: [facility];  Service: Endoscopy;  Laterality: N/A;   ESOPHAGOGASTRODUODENOSCOPY (EGD) WITH PROPOFOL N/A 11/28/2014   Procedure: ESOPHAGOGASTRODUODENOSCOPY (EGD) WITH PROPOFOL;  Surgeon: Charna Elizabeth, MD;  Location: [facility];  Service: Endoscopy;  Laterality: N/A;   INCISION AND DRAINAGE     bartholin cyst   KNEE SURGERY     bilateral   NASAL SEPTOPLASTY W/ TURBINOPLASTY Bilateral 05/09/2019   Procedure: NASAL SEPTOPLASTY  WITH BILATERAL  TURBINATE REDUCTION;  Surgeon: [name], MD;  Location: [facility];  Service: ENT;  Laterality: Bilateral;   NECK SURGERY  2019   Cervical   TONSILLECTOMY     Patient Active Problem List   Diagnosis Date Noted   Cervical spondylosis with radiculopathy 12/07/2017   Chronic pain of left ankle 10/02/2017   Myopic astigmatism of both eyes 11/14/2015   Word finding difficulty 09/10/2015   Cognitive change 09/10/2015   Insulin resistance 05/31/2015   Nonalcoholic fatty liver disease 05/31/2015   UARS (upper airway resistance syndrome) 07/14/2013   Mitral valve prolapse 06/02/2013   Pernicious anemia 06/02/2013   Obesity, Class II, BMI 35-39.9, with comorbidity 06/02/2013   Vitamin D insufficiency 06/02/2013   Obstructive sleep apnea 06/02/2013   Palpitations 06/02/2013   Family history of heart disease 06/02/2013   Blepharitis with rosacea 06/02/2013   Low back pain 06/01/2013   Chalazion of right upper eyelid 12/29/2012   Posterior tibial tendinitis 10/09/2011    PCP: [name], MD  REFERRING PROVIDER: [name], MD  REFERRING DIAG: R42 (ICD-10-CM) - Dizziness  THERAPY DIAG:  Chronic nonintractable headache, unspecified headache type  Dizziness and giddiness  Unsteadiness on feet  ONSET DATE: 2023  Rationale for Evaluation and Treatment: Rehabilitation  SUBJECTIVE:   SUBJECTIVE STATEMENT: Saw a NP and she recommended MRI and diagnosed her with ocular migraines. Prescribed 2 meds- Meclizine and possibly Odensatron. Pt reports that she will not be taking these meds d/t not wanting to mask symptoms. Reports significant motion sickness. Reports that she is not experiencing HA on B temples with her other migraine sx.    Pt accompanied by: self  PERTINENT HISTORY: Peripheral neuropathy, seizures, ankle surgery, B knee surgery, migraines  Per Dr. Suszanne Conners- "her findings were suggestive of vestibular migraine, with poor balancing  function"  PAIN:  Are you having pain? Yes: NPRS scale: 5/10 Pain location: B temples Pain description: 'Headache without the headache Aggravating factors: nothing Relieving factors: nothing  PRECAUTIONS: None  WEIGHT BEARING RESTRICTIONS: No  FALLS: Has patient fallen in last 6 months? No  LIVING ENVIRONMENT: Lives with: lives with their spouse Lives in: House/apartment Stairs:  3 steps to enter without handrail; 1 story home Has following equipment at home: Single point cane  PLOF: Independent; working full time in an office   PATIENT GOALS:   OBJECTIVE:     TODAY'S TREATMENT: 10/13/22 Activity Comments  brandt daroff 1x Pt reports that this exercise is better tolerated than R/L elbow prop done last session; c/o moderate sx   seated head turns to targets 2x30"  Mod-severe dizziness, tingling, floaters, eye pressure; edu on use of seated "recovery position" to allow sx to settle in between sets; c/o starting to get HA after 2nd set   seated head nods to targets 30" Reports of "very irritating movement" and c/o dizziness   Romberg 30" Use of Ues as needed for stability   Standing wide with EC 2x30" Unable to tolerate 2nd set d/t c/o spinning and seeing lines   Sitting with EC  Tolerated ~ 20 sec and c/o HA and floating things    HOME EXERCISE PROGRAM Last updated: 10/13/22 Access Code: [email or web address] URL: [email or web address] Date: 10/13/2022 Prepared by: [facility] - Outpatient  Rehab - [facility]  Program Notes to improve tolerance for eyes closed: try sitting for 20 sec in chair without back rest, if too hard: try sitting in a chair with back rest, if too hard: lay on back with eyes closed   Exercises - Brandt-Daroff Vestibular Exercise  - 1 x daily - 5 x weekly - 2 sets - 3-5 reps - Seated Left Head Turns Vestibular Habituation  - 1 x daily - 5 x weekly - 2 sets - 30 sec hold - Seated Head Nod  - 1 x daily - 5 x weekly - 2 sets - 30 sec hold -  Narrow Stance with Counter Support  - 1 x daily - 5 x weekly - 2 sets - 30 sec hold   PATIENT EDUCATION: Education details: educated pt on Meclizine as it is not recommended for long-term chronic use for max recovery; discussion with patient on what tasks may be difficult for her at work, discussed possible break from PT until patient is put on migraine meds d/t PT sessions exaccerbating sx at this time with pretty basic activities; HEP update Person educated: Patient Education method: Explanation, Demonstration, Tactile cues, Verbal cues, and Handouts Education comprehension: verbalized understanding and returned demonstration    Below measures were taken at time of initial evaluation unless otherwise specified:   DIAGNOSTIC FINDINGS: none recent  COGNITION: Overall cognitive status: Within functional limits for tasks assessed   SENSATION: Reports hx  of N/T d/t pernicious anemia    POSTURE:  rounded shoulders and elevated shoulders    LOWER EXTREMITY MMT:   MMT Right eval Left eval  Hip flexion    Hip abduction    Hip adduction    Hip internal rotation    Hip external rotation    Knee flexion    Knee extension    Ankle dorsiflexion    Ankle plantarflexion    Ankle inversion    Ankle eversion    (Blank rows = not tested)  GAIT: Gait pattern:  slight anterior trunk lean and hip instability  Assistive device utilized: None Level of assistance: SBA     M-CTSIB  Condition 1: Firm Surface, EO 20 Sec, Mild and Moderate Sway  Condition 2: Firm Surface, EC 3 Sec, Severe Sway; L LOB  Condition 3: Foam Surface, EO - Sec,  NT d/t safety  Sway  Condition 4: Foam Surface, EC - Sec, NT d/t safety Sway  *patient also with L LOB while standing in between tests   PATIENT SURVEYS:  FOTO NT  VESTIBULAR ASSESSMENT:  GENERAL OBSERVATION: wears glasses for astigmatism - reading and driving   OCULOMOTOR EXAM:  Ocular Alignment:  L esotropia   Ocular ROM: No  Limitations  Spontaneous Nystagmus: absent  Gaze-Induced Nystagmus: absent  Smooth Pursuits:  1 saccade in each direction and c/o eye and head pain/fatigue  Saccades: intact; c/o dizzy and floaters  Convergence/Divergence: did not report double vision but c/o eye pain/fatigue  VESTIBULAR - OCULAR REFLEX:   Slow VOR: Normal slow and guarded; c/o dizzy and uncomfortable; worse vertical vs. Horizontal   VOR Cancellation: Normal; c/o more intense dizziness and nausea  Head-Impulse Test: HIT Right: positive HIT Left: positive C/o HA after completing this test       POSITIONAL TESTING:  Right Roll Test: negative Left Roll Test: negative Right Sidelying: negative; mild dizziness upon sitting up Left Sidelying: negative; significant dizziness upon sitting up      VESTIBULAR TREATMENT:                                                                                                   DATE: 10/03/22   PATIENT EDUCATION: Education details: prognosis, POC, HEP- to be performed in corner/counter for safety; educated on need for medical management of migraines while in PT for max benefit Person educated: Patient Education method: Explanation, Demonstration, Tactile cues, Verbal cues, and Handouts Education comprehension: verbalized understanding and returned demonstration  HOME EXERCISE PROGRAM: Access Code: [email or web address] URL: [email or web address] Date: 10/03/2022 Prepared by: [facility] - Outpatient  Rehab - [facility]  Exercises - Brandt-Daroff Vestibular Exercise  - 1 x daily - 5 x weekly - 2 sets - 3-5 reps - Seated Left Head Turns Vestibular Habituation  - 1 x daily - 5 x weekly - 2 sets - 30 sec hold - Seated Head Nod  - 1 x daily - 5 x weekly - 2 sets - 30 sec hold - Narrow Stance with Counter Support  - 1 x daily - 5 x weekly - 2 sets - 30  sec hold  GOALS: Goals reviewed with patient? Yes  SHORT TERM GOALS: Target date: 10/31/2022  Patient to be independent  with initial HEP. Baseline: HEP initiated Goal status: IN PROGRESS    LONG TERM GOALS: Target date: 11/28/2022  Patient to be independent with advanced HEP. Baseline: Not yet initiated  Goal status: IN PROGRESS  Patient to report 0/10 dizziness with standing vertical and horizontal VOR for 30 seconds. Baseline: Unable Goal status: IN PROGRESS  Patient will report 0/10 dizziness with bed mobility.  Baseline: Symptomatic  Goal status: IN PROGRESS  Patient to demonstrate mild-moderate sway with M-CTSIB condition with eyes open/foam surface in order to improve safety in environments with uneven surfaces. Baseline: NT d/t safety Goal status: IN PROGRESS  Patient to score at least 20/24 on DGI in order to decrease risk of falls. Baseline: 11 Goal status: IN PROGRESS  Patient to score at least 54 on FOTO to indicate improved functional activity tolerance.  Baseline: 39 Goal status: IN PROGRESS  Patient to report tolerance for work day with <4/10 symptoms.  Baseline: unable  Goal status: IN PROGRESS    ASSESSMENT:  CLINICAL IMPRESSION: Patient arrived to session with report of being prescribed 2 new medications for dizziness and nausea by her NP which she has not been taking. Reviewed HEP and provided edu on use of "recovery position" to allow sx to settle between sets. Trialed activities with EC to reduce visual reliance- patient unable to tolerate in standing or unsupported sitting d/t migraine sx. Ultimately instructed to try supported sitting or supine at home and slowly work towards tolerance. Patient reported understanding.   OBJECTIVE IMPAIRMENTS: Abnormal gait, decreased activity tolerance, decreased balance, dizziness, improper body mechanics, postural dysfunction, and pain.   ACTIVITY LIMITATIONS: carrying, lifting, bending, standing, squatting, sleeping, stairs, transfers, bed mobility, bathing, and reach over head  PARTICIPATION LIMITATIONS: meal prep, cleaning,  laundry, driving, shopping, community activity, occupation, yard work, and church  PERSONAL FACTORS: Age, Past/current experiences, Time since onset of injury/illness/exacerbation, Transportation, and 3+ comorbidities: Peripheral neuropathy, seizures, ankle surgery, B knee surgery, migraines   are also affecting patient's functional outcome.   REHAB POTENTIAL: Good  CLINICAL DECISION MAKING: Evolving/moderate complexity  EVALUATION COMPLEXITY: Moderate   PLAN:  PT FREQUENCY: 2x/week  PT DURATION: 8 weeks  PLANNED INTERVENTIONS: Therapeutic exercises, Therapeutic activity, Neuromuscular re-education, Balance training, Gait training, Patient/Family education, Self Care, Joint mobilization, Stair training, Vestibular training, Canalith repositioning, Aquatic Therapy, Dry Needling, Cryotherapy, Moist heat, Taping, Manual therapy, and Re-evaluation  PLAN FOR NEXT SESSION: progress habituation, multisensory balance (start small), and VOR     Anette Guarneri, PT, DPT 10/13/22 5:02 PM  [facility] [address], [address] [place], [place] [address] Phone # [phone] Fax # [phone]

## 2022-10-13 ENCOUNTER — Ambulatory Visit: Payer: BC Managed Care – PPO | Admitting: Physical Therapy

## 2022-10-13 ENCOUNTER — Encounter: Payer: Self-pay | Admitting: Physical Therapy

## 2022-10-13 DIAGNOSIS — R519 Headache, unspecified: Secondary | ICD-10-CM

## 2022-10-13 DIAGNOSIS — R42 Dizziness and giddiness: Secondary | ICD-10-CM

## 2022-10-13 DIAGNOSIS — R2681 Unsteadiness on feet: Secondary | ICD-10-CM

## 2022-10-15 ENCOUNTER — Ambulatory Visit: Payer: BC Managed Care – PPO | Admitting: Physical Therapy

## 2022-10-17 ENCOUNTER — Ambulatory Visit: Payer: BC Managed Care – PPO | Admitting: Physical Therapy

## 2022-10-21 ENCOUNTER — Ambulatory Visit: Payer: BC Managed Care – PPO | Attending: Otolaryngology

## 2022-10-21 DIAGNOSIS — R519 Headache, unspecified: Secondary | ICD-10-CM | POA: Diagnosis present

## 2022-10-21 DIAGNOSIS — R293 Abnormal posture: Secondary | ICD-10-CM

## 2022-10-21 DIAGNOSIS — G8929 Other chronic pain: Secondary | ICD-10-CM | POA: Diagnosis present

## 2022-10-21 DIAGNOSIS — R2681 Unsteadiness on feet: Secondary | ICD-10-CM | POA: Diagnosis present

## 2022-10-21 DIAGNOSIS — M6281 Muscle weakness (generalized): Secondary | ICD-10-CM | POA: Diagnosis present

## 2022-10-21 DIAGNOSIS — R42 Dizziness and giddiness: Secondary | ICD-10-CM | POA: Diagnosis present

## 2022-10-21 NOTE — Therapy (Signed)
OUTPATIENT PHYSICAL THERAPY VESTIBULAR TREATMENT     Patient Name: Helen Jackson MRN: [id number] DOB:01-26-1961, 62 y.o., female Today's Date: 10/21/2022  END OF SESSION:  PT End of Session - 10/21/22 1604     Visit Number 4    Number of Visits 17    Date for PT Re-Evaluation 11/28/22    Authorization Type BCBS    Authorization - Number of Visits 30   combined   PT Start Time 1615    PT Stop Time 1700    PT Time Calculation (min) 45 min    Equipment Utilized During Treatment Gait belt    Activity Tolerance Other (comment)   limited by dizziness and migraine sx   Behavior During Therapy WFL for tasks assessed/performed               Past Medical History:  Diagnosis Date   Arthritis    Celiac disease    Colitis    Complication of anesthesia    difficulty waking up   Diverticulosis    Elevated alkaline phosphatase level    Fatty liver    Fibroid    Heart murmur    Dr. Beatris Si follows   Lichen planus    MVP (mitral valve prolapse)    Ocular rosacea    Peripheral neuropathy    Pernicious anemia    injections every 4 to 6 weeks -last injestion 09-15-14   Seizures (HCC)    see notes in anesthesia   Upper airway resistance syndrome    After sleep study   Upper respiratory disease    upper respiratory airway disease   Past Surgical History:  Procedure Laterality Date   ANKLE SURGERY     CESAREAN SECTION     COLONOSCOPY WITH PROPOFOL N/A 11/28/2014   Procedure: COLONOSCOPY WITH PROPOFOL;  Surgeon: Charna Elizabeth, MD;  Location: [facility];  Service: Endoscopy;  Laterality: N/A;   ESOPHAGOGASTRODUODENOSCOPY (EGD) WITH PROPOFOL N/A 11/28/2014   Procedure: ESOPHAGOGASTRODUODENOSCOPY (EGD) WITH PROPOFOL;  Surgeon: Charna Elizabeth, MD;  Location: [facility];  Service: Endoscopy;  Laterality: N/A;   INCISION AND DRAINAGE     bartholin cyst   KNEE SURGERY     bilateral   NASAL SEPTOPLASTY W/ TURBINOPLASTY Bilateral 05/09/2019   Procedure: NASAL SEPTOPLASTY  WITH BILATERAL  TURBINATE REDUCTION;  Surgeon: [name], MD;  Location: [facility];  Service: ENT;  Laterality: Bilateral;   NECK SURGERY  2019   Cervical   TONSILLECTOMY     Patient Active Problem List   Diagnosis Date Noted   Cervical spondylosis with radiculopathy 12/07/2017   Chronic pain of left ankle 10/02/2017   Myopic astigmatism of both eyes 11/14/2015   Word finding difficulty 09/10/2015   Cognitive change 09/10/2015   Insulin resistance 05/31/2015   Nonalcoholic fatty liver disease 05/31/2015   UARS (upper airway resistance syndrome) 07/14/2013   Mitral valve prolapse 06/02/2013   Pernicious anemia 06/02/2013   Obesity, Class II, BMI 35-39.9, with comorbidity 06/02/2013   Vitamin D insufficiency 06/02/2013   Obstructive sleep apnea 06/02/2013   Palpitations 06/02/2013   Family history of heart disease 06/02/2013   Blepharitis with rosacea 06/02/2013   Low back pain 06/01/2013   Chalazion of right upper eyelid 12/29/2012   Posterior tibial tendinitis 10/09/2011    PCP: [name], MD  REFERRING PROVIDER: [name], MD  REFERRING DIAG: R42 (ICD-10-CM) - Dizziness  THERAPY DIAG:  Dizziness and giddiness  Chronic nonintractable headache, unspecified headache type  Unsteadiness on feet  Muscle weakness (generalized)  Abnormal posture  ONSET DATE: 2023  Rationale for Evaluation and Treatment: Rehabilitation  SUBJECTIVE:   SUBJECTIVE STATEMENT: Migraines everyday. Note certain triggers--in the car, head motion, awaking at night and note that eyes are moving and noting that there was an aura present. Note that driving and being a passenger in the car have been worse with this episode. Having head pain today.   Pt accompanied by: self  PERTINENT HISTORY: Peripheral neuropathy, seizures, ankle surgery, B knee surgery, migraines  Per Dr. Suszanne Conners- "her findings were suggestive of vestibular migraine, with poor balancing function"  PAIN:  Are  you having pain? Yes: NPRS scale: 5/10 Pain location: B temples Pain description: 'Headache without the headache Aggravating factors: nothing Relieving factors: nothing  PRECAUTIONS: None  WEIGHT BEARING RESTRICTIONS: No  FALLS: Has patient fallen in last 6 months? No  LIVING ENVIRONMENT: Lives with: lives with their spouse Lives in: House/apartment Stairs:  3 steps to enter without handrail; 1 story home Has following equipment at home: Single point cane  PLOF: Independent; working full time in an office   PATIENT GOALS:   OBJECTIVE:   TODAY'S TREATMENT: 10/21/22 Activity Comments  C-spine AROM 10 reps Flexion-extension: notes feeling of parasthesias along base of skull to forehead with extension and feeling of dizzy/floaters with flexion Lateral flexion: "head is going numb/tingly", "squeezing my eyeballs" Rotation: "black floaters in vision"  Corner balance -feet together: OV/[id number] sec -semi-tandem   Head nods/rotation x 30 sec Seated w/ distant target fixation  VOR x 1 -horizontal/vertical x 8-30 sec -horizontal more problematic            HOME EXERCISE PROGRAM Last updated: 10/13/22 Access Code: [email or web address] URL: [email or web address] Date: 10/13/2022 Prepared by: [facility] - Outpatient  Rehab - [facility]  Program Notes to improve tolerance for eyes closed: try sitting for 20 sec in chair without back rest, if too hard: try sitting in a chair with back rest, if too hard: lay on back with eyes closed   Exercises - Brandt-Daroff Vestibular Exercise  - 1 x daily - 5 x weekly - 2 sets - 3-5 reps - Seated Left Head Turns Vestibular Habituation  - 1 x daily - 5 x weekly - 2 sets - 30 sec hold - Seated Head Nod  - 1 x daily - 5 x weekly - 2 sets - 30 sec hold - Narrow Stance with Counter Support  - 1 x daily - 5 x weekly - 2 sets - 30 sec hold - Corner Balance Feet Together With Eyes Closed  - 1 x daily - 7 x weekly - 3 reps - 15-30 sec hold -  Semi-Tandem Corner Balance With Eyes Open  - 1 x daily - 7 x weekly - 3 reps - 15-30 sec hold  PATIENT EDUCATION: Education details: educated pt on Meclizine as it is not recommended for long-term chronic use for max recovery; discussion with patient on what tasks may be difficult for her at work, discussed possible break from PT until patient is put on migraine meds d/t PT sessions exaccerbating sx at this time with pretty basic activities; HEP update Person educated: Patient Education method: Explanation, Demonstration, Tactile cues, Verbal cues, and Handouts Education comprehension: verbalized understanding and returned demonstration    Below measures were taken at time of initial evaluation unless otherwise specified:   DIAGNOSTIC FINDINGS: none recent  COGNITION: Overall cognitive status: Within functional limits for tasks assessed   SENSATION: Reports hx of N/T d/t  pernicious anemia    POSTURE:  rounded shoulders and elevated shoulders    LOWER EXTREMITY MMT:   MMT Right eval Left eval  Hip flexion    Hip abduction    Hip adduction    Hip internal rotation    Hip external rotation    Knee flexion    Knee extension    Ankle dorsiflexion    Ankle plantarflexion    Ankle inversion    Ankle eversion    (Blank rows = not tested)  GAIT: Gait pattern:  slight anterior trunk lean and hip instability  Assistive device utilized: None Level of assistance: SBA     M-CTSIB  Condition 1: Firm Surface, EO 20 Sec, Mild and Moderate Sway  Condition 2: Firm Surface, EC 3 Sec, Severe Sway; L LOB  Condition 3: Foam Surface, EO - Sec,  NT d/t safety  Sway  Condition 4: Foam Surface, EC - Sec, NT d/t safety Sway  *patient also with L LOB while standing in between tests   PATIENT SURVEYS:  FOTO NT  VESTIBULAR ASSESSMENT:  GENERAL OBSERVATION: wears glasses for astigmatism - reading and driving   OCULOMOTOR EXAM:  Ocular Alignment:  L esotropia   Ocular ROM: No  Limitations  Spontaneous Nystagmus: absent  Gaze-Induced Nystagmus: absent  Smooth Pursuits:  1 saccade in each direction and c/o eye and head pain/fatigue  Saccades: intact; c/o dizzy and floaters  Convergence/Divergence: did not report double vision but c/o eye pain/fatigue  VESTIBULAR - OCULAR REFLEX:   Slow VOR: Normal slow and guarded; c/o dizzy and uncomfortable; worse vertical vs. Horizontal   VOR Cancellation: Normal; c/o more intense dizziness and nausea  Head-Impulse Test: HIT Right: positive HIT Left: positive C/o HA after completing this test       POSITIONAL TESTING:  Right Roll Test: negative Left Roll Test: negative Right Sidelying: negative; mild dizziness upon sitting up Left Sidelying: negative; significant dizziness upon sitting up        GOALS: Goals reviewed with patient? Yes  SHORT TERM GOALS: Target date: 10/31/2022  Patient to be independent with initial HEP. Baseline: HEP initiated Goal status: IN PROGRESS    LONG TERM GOALS: Target date: 11/28/2022  Patient to be independent with advanced HEP. Baseline: Not yet initiated  Goal status: IN PROGRESS  Patient to report 0/10 dizziness with standing vertical and horizontal VOR for 30 seconds. Baseline: Unable Goal status: IN PROGRESS  Patient will report 0/10 dizziness with bed mobility.  Baseline: Symptomatic  Goal status: IN PROGRESS  Patient to demonstrate mild-moderate sway with M-CTSIB condition with eyes open/foam surface in order to improve safety in environments with uneven surfaces. Baseline: NT d/t safety Goal status: IN PROGRESS  Patient to score at least 20/24 on DGI in order to decrease risk of falls. Baseline: 11 Goal status: IN PROGRESS  Patient to score at least 54 on FOTO to indicate improved functional activity tolerance.  Baseline: 39 Goal status: IN PROGRESS  Patient to report tolerance for work day with <4/10 symptoms.  Baseline: unable  Goal status: IN  PROGRESS    ASSESSMENT:  CLINICAL IMPRESSION: Returns to clinic and notes that she experiences migraine on daily basis and notes over past few days experiencing nocturnal episodes as well that awaken her from sleep and notes experiencing visual aura with these recent episodes.  During cervical spine ROM pt experiences panoply of symptoms ranging from eye floaters to report of head/neck/back numbness and worsening of HA/dizziness.  Patient's neck movements were  quite limited throughout limiting to 25% ROM due to onset of symptoms.  Initiated multi-sensory balance activities in standing in corner for safety to improve postural stability and demonstrates moderate-severe sway with eyes open/closed and in semi-tandem.  Encouraged to perform these at home with back to corner and chair in front for hand support/feedback.  Overall, demonstrates poor tolerance to movement demands and remained quite guarded of head/neck movements throughout sessions.  Encouraged gentle repetition throughout the day to increase amplitude and exposure to these movements and on maintaining symptom threshold <4/10.  OBJECTIVE IMPAIRMENTS: Abnormal gait, decreased activity tolerance, decreased balance, dizziness, improper body mechanics, postural dysfunction, and pain.   ACTIVITY LIMITATIONS: carrying, lifting, bending, standing, squatting, sleeping, stairs, transfers, bed mobility, bathing, and reach over head  PARTICIPATION LIMITATIONS: meal prep, cleaning, laundry, driving, shopping, community activity, occupation, yard work, and church  PERSONAL FACTORS: Age, Past/current experiences, Time since onset of injury/illness/exacerbation, Transportation, and 3+ comorbidities: Peripheral neuropathy, seizures, ankle surgery, B knee surgery, migraines   are also affecting patient's functional outcome.   REHAB POTENTIAL: Good  CLINICAL DECISION MAKING: Evolving/moderate complexity  EVALUATION COMPLEXITY: Moderate   PLAN:  PT  FREQUENCY: 2x/week  PT DURATION: 8 weeks  PLANNED INTERVENTIONS: Therapeutic exercises, Therapeutic activity, Neuromuscular re-education, Balance training, Gait training, Patient/Family education, Self Care, Joint mobilization, Stair training, Vestibular training, Canalith repositioning, Aquatic Therapy, Dry Needling, Cryotherapy, Moist heat, Taping, Manual therapy, and Re-evaluation  PLAN FOR NEXT SESSION: progress habituation, multisensory balance (start small), and VOR   5:07 PM, 10/21/22 M. Shary Decamp, PT, DPT Physical Therapist- [place] Office Number: [phone]

## 2022-10-22 ENCOUNTER — Encounter: Payer: BC Managed Care – PPO | Admitting: Physical Therapy

## 2022-10-27 ENCOUNTER — Ambulatory Visit: Payer: BC Managed Care – PPO | Admitting: Physical Therapy

## 2022-10-29 ENCOUNTER — Encounter: Payer: BC Managed Care – PPO | Admitting: Physical Therapy

## 2022-10-30 ENCOUNTER — Ambulatory Visit: Payer: BC Managed Care – PPO | Admitting: Physical Therapy

## 2022-10-31 ENCOUNTER — Encounter: Payer: BC Managed Care – PPO | Admitting: Physical Therapy

## 2022-10-31 NOTE — Therapy (Signed)
OUTPATIENT PHYSICAL THERAPY VESTIBULAR TREATMENT     Patient Name: Helen Jackson MRN: [id number] DOB:February 05, 1961, 62 y.o., female Today's Date: 11/05/2022  END OF SESSION:  PT End of Session - 11/05/22 1639     Visit Number 6    Number of Visits 17    Date for PT Re-Evaluation 11/28/22    Authorization Type BCBS    Authorization - Number of Visits 30   combined   PT Start Time 1559    PT Stop Time 1639    PT Time Calculation (min) 40 min    Equipment Utilized During Treatment Gait belt    Activity Tolerance Patient tolerated treatment well    Behavior During Therapy WFL for tasks assessed/performed                Past Medical History:  Diagnosis Date   Arthritis    Celiac disease    Colitis    Complication of anesthesia    difficulty waking up   Diverticulosis    Elevated alkaline phosphatase level    Fatty liver    Fibroid    Heart murmur    Dr. Beatris Si follows   Lichen planus    MVP (mitral valve prolapse)    Ocular rosacea    Peripheral neuropathy    Pernicious anemia    injections every 4 to 6 weeks -last injestion 09-15-14   Seizures (HCC)    see notes in anesthesia   Upper airway resistance syndrome    After sleep study   Upper respiratory disease    upper respiratory airway disease   Past Surgical History:  Procedure Laterality Date   ANKLE SURGERY     CESAREAN SECTION     COLONOSCOPY WITH PROPOFOL N/A 11/28/2014   Procedure: COLONOSCOPY WITH PROPOFOL;  Surgeon: Charna Elizabeth, MD;  Location: [facility];  Service: Endoscopy;  Laterality: N/A;   ESOPHAGOGASTRODUODENOSCOPY (EGD) WITH PROPOFOL N/A 11/28/2014   Procedure: ESOPHAGOGASTRODUODENOSCOPY (EGD) WITH PROPOFOL;  Surgeon: Charna Elizabeth, MD;  Location: [facility];  Service: Endoscopy;  Laterality: N/A;   INCISION AND DRAINAGE     bartholin cyst   KNEE SURGERY     bilateral   NASAL SEPTOPLASTY W/ TURBINOPLASTY Bilateral 05/09/2019   Procedure: NASAL SEPTOPLASTY WITH BILATERAL   TURBINATE REDUCTION;  Surgeon: [name], MD;  Location: [facility];  Service: ENT;  Laterality: Bilateral;   NECK SURGERY  2019   Cervical   TONSILLECTOMY     Patient Active Problem List   Diagnosis Date Noted   Cervical spondylosis with radiculopathy 12/07/2017   Chronic pain of left ankle 10/02/2017   Myopic astigmatism of both eyes 11/14/2015   Word finding difficulty 09/10/2015   Cognitive change 09/10/2015   Insulin resistance 05/31/2015   Nonalcoholic fatty liver disease 05/31/2015   UARS (upper airway resistance syndrome) 07/14/2013   Mitral valve prolapse 06/02/2013   Pernicious anemia 06/02/2013   Obesity, Class II, BMI 35-39.9, with comorbidity 06/02/2013   Vitamin D insufficiency 06/02/2013   Obstructive sleep apnea 06/02/2013   Palpitations 06/02/2013   Family history of heart disease 06/02/2013   Blepharitis with rosacea 06/02/2013   Low back pain 06/01/2013   Chalazion of right upper eyelid 12/29/2012   Posterior tibial tendinitis 10/09/2011    PCP: [name], MD  REFERRING PROVIDER: [name], MD  REFERRING DIAG: R42 (ICD-10-CM) - Dizziness  THERAPY DIAG:  Dizziness and giddiness  Chronic nonintractable headache, unspecified headache type  Unsteadiness on feet  ONSET DATE: 2023  Rationale for Evaluation and Treatment: Rehabilitation  SUBJECTIVE:   SUBJECTIVE STATEMENT: Nothing new. Loaded up on some Advil earlier today. 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 Waking up at 4am with HAs but this is not every morning anymore. Has been trying to pick up on potential migraine triggers.    Pt accompanied by: self  PERTINENT HISTORY: Peripheral neuropathy, seizures, ankle surgery, B knee surgery, migraines  Per Dr. Suszanne Conners- "her findings were suggestive of vestibular migraine, with poor balancing function"  PAIN:  Are you having pain? Yes: NPRS  scale: 5.5-6/10 Pain location: B temples Pain description: 'Headache without the headache Aggravating factors: nothing Relieving factors: nothing  PRECAUTIONS: None  WEIGHT BEARING RESTRICTIONS: No  FALLS: Has patient fallen in last 6 months? No  LIVING ENVIRONMENT: Lives with: lives with their spouse Lives in: House/apartment Stairs:  3 steps to enter without handrail; 1 story home Has following equipment at home: Single point cane  PLOF: Independent; working full time in an office   PATIENT GOALS:   OBJECTIVE:    TODAY'S TREATMENT: 11/05/22 Activity Comments  standing ant/pos wt shift 2x10 Hands on counter; c/o paresthesias, visual auras. Standing rest breaks to present sx from elevating too high   Romberg with lights on 2x30" Occasional posterior LOB, cueing to shift anteriorly onto toes   Romberg in dark room (instead of EC as this is poorly tolerated) 4x30" Posterior LOB often, c/o symptoms stronger than lights on  wall bumps hip and shoulder strategy 5-10 each C/o more migraine and visual symptoms with shoulder strategy   Step ups on foam in II bars Cueing to look down for safe foot placement,      PATIENT EDUCATION: Education details: edu on use of balance reflexes to correct balance rather than utilizing reaching strategy, HEP update  Person educated: Patient Education method: Explanation, Demonstration, Tactile cues, Verbal cues, and Handouts Education comprehension: verbalized understanding and returned demonstration   HOME EXERCISE PROGRAM Access Code: [email or web address] URL: [email or web address] Date: 11/05/2022 Prepared by: [facility] - Outpatient  Rehab - [facility]  Program Notes to improve tolerance for eyes closed: try sitting for 20 sec in chair without back rest, if too hard: try sitting in a chair with back rest, if too hard: lay on back with eyes closed   Exercises - Brandt-Daroff Vestibular Exercise  - 1 x daily - 5 x weekly - 2 sets  - 3-5 reps - Seated Left Head Turns Vestibular Habituation  - 1 x daily - 5 x weekly - 2 sets - 30 sec hold - Seated Head Nod  - 1 x daily - 5 x weekly - 2 sets - 30 sec hold - Narrow Stance with Counter Support  - 1 x daily - 5 x weekly - 2 sets - 30 sec hold - Corner Balance Feet Together With Eyes Closed  - 1 x daily - 7 x weekly - 3 reps - 15-30 sec hold - Semi-Tandem Corner Balance With Eyes Open  - 1 x daily - 7 x weekly - 3 reps - 15-30 sec hold - Standing Anterior Posterior Weight Shift  - 1 x daily - 5 x weekly - 2 sets - 10 reps    Below measures were taken at time of initial evaluation unless otherwise specified:   DIAGNOSTIC FINDINGS: none recent  COGNITION: Overall cognitive status: Within functional limits for tasks assessed  SENSATION: Reports hx of N/T d/t pernicious anemia    POSTURE:  rounded shoulders and elevated shoulders    LOWER EXTREMITY MMT:   MMT Right eval Left eval  Hip flexion    Hip abduction    Hip adduction    Hip internal rotation    Hip external rotation    Knee flexion    Knee extension    Ankle dorsiflexion    Ankle plantarflexion    Ankle inversion    Ankle eversion    (Blank rows = not tested)  GAIT: Gait pattern:  slight anterior trunk lean and hip instability  Assistive device utilized: None Level of assistance: SBA     M-CTSIB  Condition 1: Firm Surface, EO 20 Sec, Mild and Moderate Sway  Condition 2: Firm Surface, EC 3 Sec, Severe Sway; L LOB  Condition 3: Foam Surface, EO - Sec,  NT d/t safety  Sway  Condition 4: Foam Surface, EC - Sec, NT d/t safety Sway  *patient also with L LOB while standing in between tests   PATIENT SURVEYS:  FOTO NT  VESTIBULAR ASSESSMENT:  GENERAL OBSERVATION: wears glasses for astigmatism - reading and driving   OCULOMOTOR EXAM:  Ocular Alignment:  L esotropia   Ocular ROM: No Limitations  Spontaneous Nystagmus: absent  Gaze-Induced Nystagmus: absent  Smooth Pursuits:  1  saccade in each direction and c/o eye and head pain/fatigue  Saccades: intact; c/o dizzy and floaters  Convergence/Divergence: did not report double vision but c/o eye pain/fatigue  VESTIBULAR - OCULAR REFLEX:   Slow VOR: Normal slow and guarded; c/o dizzy and uncomfortable; worse vertical vs. Horizontal   VOR Cancellation: Normal; c/o more intense dizziness and nausea  Head-Impulse Test: HIT Right: positive HIT Left: positive C/o HA after completing this test       POSITIONAL TESTING:  Right Roll Test: negative Left Roll Test: negative Right Sidelying: negative; mild dizziness upon sitting up Left Sidelying: negative; significant dizziness upon sitting up        GOALS: Goals reviewed with patient? Yes  SHORT TERM GOALS: Target date: 10/31/2022  Patient to be independent with initial HEP. Baseline: HEP initiated Goal status: IN PROGRESS    LONG TERM GOALS: Target date: 11/28/2022  Patient to be independent with advanced HEP. Baseline: Not yet initiated  Goal status: IN PROGRESS  Patient to report 0/10 dizziness with standing vertical and horizontal VOR for 30 seconds. Baseline: Unable Goal status: IN PROGRESS  Patient will report 0/10 dizziness with bed mobility.  Baseline: Symptomatic  Goal status: IN PROGRESS  Patient to demonstrate mild-moderate sway with M-CTSIB condition with eyes open/foam surface in order to improve safety in environments with uneven surfaces. Baseline: NT d/t safety Goal status: IN PROGRESS  Patient to score at least 20/24 on DGI in order to decrease risk of falls. Baseline: 11 Goal status: IN PROGRESS  Patient to score at least 54 on FOTO to indicate improved functional activity tolerance.  Baseline: 39 Goal status: IN PROGRESS  Patient to report tolerance for work day with <4/10 symptoms.  Baseline: unable  Goal status: IN PROGRESS    ASSESSMENT:  CLINICAL IMPRESSION: Patient arrived to session without new complaints.  Session today focused on balance activities- patient continues to struggle most with removal of visual input but was able to tolerate balance activity in dim lighting today. Tendency for posterior LOB, thus coached patient in utilizing ankle strategy to recognize and recover balance. Overall, patient tolerated session well despite elevation of migraine  symptoms. No complaints upon leaving.   OBJECTIVE IMPAIRMENTS: Abnormal gait, decreased activity tolerance, decreased balance, dizziness, improper body mechanics, postural dysfunction, and pain.   ACTIVITY LIMITATIONS: carrying, lifting, bending, standing, squatting, sleeping, stairs, transfers, bed mobility, bathing, and reach over head  PARTICIPATION LIMITATIONS: meal prep, cleaning, laundry, driving, shopping, community activity, occupation, yard work, and church  PERSONAL FACTORS: Age, Past/current experiences, Time since onset of injury/illness/exacerbation, Transportation, and 3+ comorbidities: Peripheral neuropathy, seizures, ankle surgery, B knee surgery, migraines   are also affecting patient's functional outcome.   REHAB POTENTIAL: Good  CLINICAL DECISION MAKING: Evolving/moderate complexity  EVALUATION COMPLEXITY: Moderate   PLAN:  PT FREQUENCY: 2x/week  PT DURATION: 8 weeks  PLANNED INTERVENTIONS: Therapeutic exercises, Therapeutic activity, Neuromuscular re-education, Balance training, Gait training, Patient/Family education, Self Care, Joint mobilization, Stair training, Vestibular training, Canalith repositioning, Aquatic Therapy, Dry Needling, Cryotherapy, Moist heat, Taping, Manual therapy, and Re-evaluation  PLAN FOR NEXT SESSION: progress habituation, multisensory balance (start small), and VOR   Anette Guarneri, PT, DPT 11/05/22 4:42 PM  [facility] [address] [place], [place] [address] Phone # [phone] Fax # [phone]

## 2022-11-05 ENCOUNTER — Encounter: Payer: Self-pay | Admitting: Physical Therapy

## 2022-11-05 ENCOUNTER — Ambulatory Visit: Payer: BC Managed Care – PPO | Admitting: Physical Therapy

## 2022-11-05 ENCOUNTER — Encounter: Payer: BC Managed Care – PPO | Admitting: Physical Therapy

## 2022-11-05 DIAGNOSIS — R519 Headache, unspecified: Secondary | ICD-10-CM

## 2022-11-05 DIAGNOSIS — R42 Dizziness and giddiness: Secondary | ICD-10-CM

## 2022-11-05 DIAGNOSIS — R2681 Unsteadiness on feet: Secondary | ICD-10-CM

## 2022-11-07 ENCOUNTER — Encounter: Payer: BC Managed Care – PPO | Admitting: Physical Therapy

## 2022-11-12 ENCOUNTER — Encounter: Payer: BC Managed Care – PPO | Admitting: Physical Therapy

## 2022-11-12 ENCOUNTER — Ambulatory Visit: Payer: BC Managed Care – PPO | Admitting: Physical Therapy

## 2022-11-14 ENCOUNTER — Encounter: Payer: BC Managed Care – PPO | Admitting: Physical Therapy

## 2022-11-18 NOTE — Therapy (Signed)
OUTPATIENT PHYSICAL THERAPY VESTIBULAR TREATMENT     Patient Name: Helen Jackson MRN: [id number] DOB:1961-05-05, 62 y.o., female Today's Date: 11/19/2022  END OF SESSION:  PT End of Session - 11/19/22 1259     Visit Number 7    Number of Visits 17    Date for PT Re-Evaluation 11/28/22    Authorization Type [employer]    Authorization - Number of Visits 30   combined   PT Start Time 1228    PT Stop Time 1310    PT Time Calculation (min) 42 min    Equipment Utilized During Treatment Gait belt    Activity Tolerance Patient tolerated treatment well    Behavior During Therapy WFL for tasks assessed/performed                 Past Medical History:  Diagnosis Date   Arthritis    Celiac disease    Colitis    Complication of anesthesia    difficulty waking up   Diverticulosis    Elevated alkaline phosphatase level    Fatty liver    Fibroid    Heart murmur    Dr. Beatris Si follows   Lichen planus    MVP (mitral valve prolapse)    Ocular rosacea    Peripheral neuropathy    Pernicious anemia    injections every 4 to 6 weeks -last injestion 09-15-14   Seizures (HCC)    see notes in anesthesia   Upper airway resistance syndrome    After sleep study   Upper respiratory disease    upper respiratory airway disease   Past Surgical History:  Procedure Laterality Date   ANKLE SURGERY     CESAREAN SECTION     COLONOSCOPY WITH PROPOFOL N/A 11/28/2014   Procedure: COLONOSCOPY WITH PROPOFOL;  Surgeon: Charna Elizabeth, MD;  Location: [facility];  Service: Endoscopy;  Laterality: N/A;   ESOPHAGOGASTRODUODENOSCOPY (EGD) WITH PROPOFOL N/A 11/28/2014   Procedure: ESOPHAGOGASTRODUODENOSCOPY (EGD) WITH PROPOFOL;  Surgeon: Charna Elizabeth, MD;  Location: [facility];  Service: Endoscopy;  Laterality: N/A;   INCISION AND DRAINAGE     bartholin cyst   KNEE SURGERY     bilateral   NASAL SEPTOPLASTY W/ TURBINOPLASTY Bilateral 05/09/2019   Procedure: NASAL SEPTOPLASTY WITH BILATERAL   TURBINATE REDUCTION;  Surgeon: [name], MD;  Location: [facility];  Service: ENT;  Laterality: Bilateral;   NECK SURGERY  2019   Cervical   TONSILLECTOMY     Patient Active Problem List   Diagnosis Date Noted   Chronic migraine without aura without status migrainosus, not intractable 11/19/2022   Cervical spondylosis with radiculopathy 12/07/2017   Chronic pain of left ankle 10/02/2017   Myopic astigmatism of both eyes 11/14/2015   Word finding difficulty 09/10/2015   Cognitive change 09/10/2015   Insulin resistance 05/31/2015   Nonalcoholic fatty liver disease 05/31/2015   UARS (upper airway resistance syndrome) 07/14/2013   Mitral valve prolapse 06/02/2013   Pernicious anemia 06/02/2013   Obesity, Class II, BMI 35-39.9, with comorbidity 06/02/2013   Vitamin D insufficiency 06/02/2013   Obstructive sleep apnea 06/02/2013   Palpitations 06/02/2013   Family history of heart disease 06/02/2013   Blepharitis with rosacea 06/02/2013   Low back pain 06/01/2013   Chalazion of right upper eyelid 12/29/2012   Posterior tibial tendinitis 10/09/2011    PCP: [name], MD  REFERRING PROVIDER: [name], MD  REFERRING DIAG: R42 (ICD-10-CM) - Dizziness  THERAPY DIAG:  Dizziness and giddiness  Chronic  nonintractable headache, unspecified headache type  Unsteadiness on feet  ONSET DATE: 2023  Rationale for Evaluation and Treatment: Rehabilitation  SUBJECTIVE:   SUBJECTIVE STATEMENT: Saw neurologist who gave her an Ajovy injection and prescribed a preventative and abortive medication. Also ordered MRI. Following up with her spine surgeon on June 26th to talk about her balance. Neurologist recommended continue PT. Reports dizziness has been 8-9/10 today- feels like it is d/t being in the car.    Pt accompanied by: self  PERTINENT HISTORY: Peripheral neuropathy, seizures, ankle surgery, B knee surgery, migraines  Per Dr. Suszanne Conners- "her findings were suggestive of  vestibular migraine, with poor balancing function"  PAIN:  Are you having pain? Yes: NPRS scale: 5-7/10 Pain location: B temples Pain description: Headache without the headache Aggravating factors: nothing Relieving factors: nothing  PRECAUTIONS: None  WEIGHT BEARING RESTRICTIONS: No  FALLS: Has patient fallen in last 6 months? No  LIVING ENVIRONMENT: Lives with: lives with their spouse Lives in: House/apartment Stairs:  3 steps to enter without handrail; 1 story home Has following equipment at home: Single point cane  PLOF: Independent; working full time in an office   PATIENT GOALS:   OBJECTIVE:     TODAY'S TREATMENT: 11/19/22 Activity Comments  STS on foam with chair in front 2x5 C/o visual disturbances, head N/T, HA; improved after sit break. Improved balance with this activity   STS in front of blinds 2x3 Using gaze on target- pt noted severe increase in symptoms thus educated on rest break with eyes closed in chair with back rest with deep breaths.  romberg + ball toss 2x10 Good balance; c/o same symptoms as above with movements. Sitting rest break  sitting R anterior habituation 3x  Slow; c/o same migraine sx as above as well as dizziness with movement         PATIENT EDUCATION: Education details: discussion on patient's neurology appointment, discussion on possible safe/gentle fitness activities to help in general movement tolerance  Person educated: Patient Education method: Explanation Education comprehension: verbalized understanding    HOME EXERCISE PROGRAM Access Code: [email or web address] URL: [email or web address] Date: 11/05/2022 Prepared by: [facility] - [facility]  Program Notes to improve tolerance for eyes closed: try sitting for 20 sec in chair without back rest, if too hard: try sitting in a chair with back rest, if too hard: lay on back with eyes closed   Exercises - Brandt-Daroff Vestibular Exercise  - 1 x daily  - 5 x weekly - 2 sets - 3-5 reps - Seated Left Head Turns Vestibular Habituation  - 1 x daily - 5 x weekly - 2 sets - 30 sec hold - Seated Head Nod  - 1 x daily - 5 x weekly - 2 sets - 30 sec hold - Narrow Stance with Counter Support  - 1 x daily - 5 x weekly - 2 sets - 30 sec hold - Corner Balance Feet Together With Eyes Closed  - 1 x daily - 7 x weekly - 3 reps - 15-30 sec hold - Semi-Tandem Corner Balance With Eyes Open  - 1 x daily - 7 x weekly - 3 reps - 15-30 sec hold - Standing Anterior Posterior Weight Shift  - 1 x daily - 5 x weekly - 2 sets - 10 reps    Below measures were taken at time of initial evaluation unless otherwise specified:   DIAGNOSTIC FINDINGS: none recent  COGNITION: Overall cognitive status: Within functional limits for tasks assessed  SENSATION: Reports hx of N/T d/t pernicious anemia    POSTURE:  rounded shoulders and elevated shoulders    LOWER EXTREMITY MMT:   MMT Right eval Left eval  Hip flexion    Hip abduction    Hip adduction    Hip internal rotation    Hip external rotation    Knee flexion    Knee extension    Ankle dorsiflexion    Ankle plantarflexion    Ankle inversion    Ankle eversion    (Blank rows = not tested)  GAIT: Gait pattern:  slight anterior trunk lean and hip instability  Assistive device utilized: None Level of assistance: SBA     M-CTSIB  Condition 1: Firm Surface, EO 20 Sec, Mild and Moderate Sway  Condition 2: Firm Surface, EC 3 Sec, Severe Sway; L LOB  Condition 3: Foam Surface, EO - Sec,  NT d/t safety  Sway  Condition 4: Foam Surface, EC - Sec, NT d/t safety Sway  *patient also with L LOB while standing in between tests   PATIENT SURVEYS:  FOTO NT  VESTIBULAR ASSESSMENT:  GENERAL OBSERVATION: wears glasses for astigmatism - reading and driving   OCULOMOTOR EXAM:  Ocular Alignment:  L esotropia   Ocular ROM: No Limitations  Spontaneous Nystagmus: absent  Gaze-Induced Nystagmus:  absent  Smooth Pursuits:  1 saccade in each direction and c/o eye and head pain/fatigue  Saccades: intact; c/o dizzy and floaters  Convergence/Divergence: did not report double vision but c/o eye pain/fatigue  VESTIBULAR - OCULAR REFLEX:   Slow VOR: Normal slow and guarded; c/o dizzy and uncomfortable; worse vertical vs. Horizontal   VOR Cancellation: Normal; c/o more intense dizziness and nausea  Head-Impulse Test: HIT Right: positive HIT Left: positive C/o HA after completing this test       POSITIONAL TESTING:  Right Roll Test: negative Left Roll Test: negative Right Sidelying: negative; mild dizziness upon sitting up Left Sidelying: negative; significant dizziness upon sitting up        GOALS: Goals reviewed with patient? Yes  SHORT TERM GOALS: Target date: 10/31/2022  Patient to be independent with initial HEP. Baseline: HEP initiated Goal status: IN PROGRESS    LONG TERM GOALS: Target date: 11/28/2022  Patient to be independent with advanced HEP. Baseline: Not yet initiated  Goal status: IN PROGRESS  Patient to report 0/10 dizziness with standing vertical and horizontal VOR for 30 seconds. Baseline: Unable Goal status: IN PROGRESS  Patient will report 0/10 dizziness with bed mobility.  Baseline: Symptomatic  Goal status: IN PROGRESS  Patient to demonstrate mild-moderate sway with M-CTSIB condition with eyes open/foam surface in order to improve safety in environments with uneven surfaces. Baseline: NT d/t safety Goal status: IN PROGRESS  Patient to score at least 20/24 on DGI in order to decrease risk of falls. Baseline: 11 Goal status: IN PROGRESS  Patient to score at least 54 on FOTO to indicate improved functional activity tolerance.  Baseline: 39 Goal status: IN PROGRESS  Patient to report tolerance for work day with <4/10 symptoms.  Baseline: unable  Goal status: IN PROGRESS    ASSESSMENT:  CLINICAL IMPRESSION: Patient arrived to session  with report of seeing Neurologist earlier today and was prescribed new migraine meds and recommended continuing PT. Worked on continuation of multisensory balance activities with sitting rest break in between sets for max tolerance. Patient continues to report migraine symptoms when performed these activities, but balance appeared to be improved compared to last session. Patient  very sensitive to change in visual input such use of blinds as background to activities. Patient tolerated session despite continued migraine symptoms- noted that she is not driving herself to/from these appointments.     OBJECTIVE IMPAIRMENTS: Abnormal gait, decreased activity tolerance, decreased balance, dizziness, improper body mechanics, postural dysfunction, and pain.   ACTIVITY LIMITATIONS: carrying, lifting, bending, standing, squatting, sleeping, stairs, transfers, bed mobility, bathing, and reach over head  PARTICIPATION LIMITATIONS: meal prep, cleaning, laundry, driving, shopping, community activity, occupation, yard work, and church  PERSONAL FACTORS: Age, Past/current experiences, Time since onset of injury/illness/exacerbation, Transportation, and 3+ comorbidities: Peripheral neuropathy, seizures, ankle surgery, B knee surgery, migraines   are also affecting patient's functional outcome.   REHAB POTENTIAL: Good  CLINICAL DECISION MAKING: Evolving/moderate complexity  EVALUATION COMPLEXITY: Moderate   PLAN:  PT FREQUENCY: 2x/week  PT DURATION: 8 weeks  PLANNED INTERVENTIONS: Therapeutic exercises, Therapeutic activity, Neuromuscular re-education, Balance training, Gait training, Patient/Family education, Self Care, Joint mobilization, Stair training, Vestibular training, Canalith repositioning, Aquatic Therapy, Dry Needling, Cryotherapy, Moist heat, Taping, Manual therapy, and Re-evaluation  PLAN FOR NEXT SESSION: progress habituation, multisensory balance (start small), and VOR   Anette Guarneri, PT, DPT 11/19/22 1:11 PM  [facility] [address] [place], [place] [address] Phone # [phone] Fax # [phone]

## 2022-11-19 ENCOUNTER — Ambulatory Visit: Payer: BC Managed Care – PPO | Attending: Otolaryngology | Admitting: Physical Therapy

## 2022-11-19 ENCOUNTER — Ambulatory Visit: Payer: BC Managed Care – PPO | Admitting: Neurology

## 2022-11-19 ENCOUNTER — Encounter: Payer: Self-pay | Admitting: Physical Therapy

## 2022-11-19 ENCOUNTER — Encounter: Payer: Self-pay | Admitting: Neurology

## 2022-11-19 VITALS — BP 133/73 | HR 74 | Ht 63.0 in | Wt 194.2 lb

## 2022-11-19 DIAGNOSIS — R42 Dizziness and giddiness: Secondary | ICD-10-CM | POA: Diagnosis present

## 2022-11-19 DIAGNOSIS — R519 Headache, unspecified: Secondary | ICD-10-CM | POA: Insufficient documentation

## 2022-11-19 DIAGNOSIS — G43809 Other migraine, not intractable, without status migrainosus: Secondary | ICD-10-CM

## 2022-11-19 DIAGNOSIS — G43709 Chronic migraine without aura, not intractable, without status migrainosus: Secondary | ICD-10-CM | POA: Diagnosis not present

## 2022-11-19 DIAGNOSIS — R2681 Unsteadiness on feet: Secondary | ICD-10-CM | POA: Diagnosis present

## 2022-11-19 DIAGNOSIS — H547 Unspecified visual loss: Secondary | ICD-10-CM

## 2022-11-19 DIAGNOSIS — R51 Headache with orthostatic component, not elsewhere classified: Secondary | ICD-10-CM

## 2022-11-19 DIAGNOSIS — E519 Thiamine deficiency, unspecified: Secondary | ICD-10-CM | POA: Diagnosis not present

## 2022-11-19 DIAGNOSIS — G8929 Other chronic pain: Secondary | ICD-10-CM | POA: Insufficient documentation

## 2022-11-19 DIAGNOSIS — G44009 Cluster headache syndrome, unspecified, not intractable: Secondary | ICD-10-CM

## 2022-11-19 MED ORDER — AJOVY 225 MG/1.5ML ~~LOC~~ SOAJ: 225.0000 mg | SUBCUTANEOUS | 11 refills | Status: DC

## 2022-11-19 MED ORDER — NURTEC 75 MG PO TBDP: 75.0000 mg | ORAL_TABLET | Freq: Every day | ORAL | 6 refills | Status: DC | PRN

## 2022-11-19 MED ORDER — FREMANEZUMAB-VFRM 225 MG/1.5ML ~~LOC~~ SOSY: 225.0000 mg | PREFILLED_SYRINGE | Freq: Once | SUBCUTANEOUS | Status: DC

## 2022-11-19 NOTE — Progress Notes (Unsigned)
[facility]    Provider:  [name] Referring Provider: [name], MD Primary Care Physician:  [name], MD    CC:  vestibular migraines   HPI 11/19/2022:  Helen Jackson is a 62 y.o. female here as requested by [name], MD for vestibular migraines.has Mitral valve prolapse; Pernicious anemia; Obesity, Class II, BMI 35-39.9, with comorbidity; Vitamin D insufficiency; Obstructive sleep apnea(resolved); Palpitations; Family history of heart disease; Blepharitis with rosacea; UARS (upper airway resistance syndrome); Word finding difficulty; Cognitive change; Chalazion of right upper eyelid; Insulin resistance; Low back pain; Myopic astigmatism of both eyes; Nonalcoholic fatty liver disease; Cervical spondylosis with radiculopathy(s/p ACDF); Chronic pain of left ankle; and Posterior tibial tendinitis, heart murmur, chronic anal fissure and noted proctitis  on their problem list.  We have seen her in the past for memory problems, she had a workup including formal memory testing which showed normal cognitive aging.  At that time we ordered an MRI of the brain which was unremarkable.  I reviewed my past medical notes and imaging.  When we saw her in 2017 thyroid panel was normal, methylmalonic acid was normal, HIV was negative, RPR nonreactive, B12 and folate panel showed vitamin B12 320 (but MMA was normal), folate was normal.  In July 2018 vitamin D was in the low normal range of 30.6.  She was referred by Dr. [name] however I do not see any notes from him on [facility].  I do see [name] last note April 2024 on "[facility]" but I do not see any details.  I do not see any recent brain imaging on "[facility]" and recent labs from [facility] are from 2022. I reviewed his notes: She was previously seen for recurrent dizziness, the patient has also been experiencing recurrent dizziness since fall 2023 often accompanied by bilateral aural pressure, she also has  migraine headaches with light and sound sensitivity, in between vertigo episodes the patient also has difficulty with her balance, she recently underwent vestibular neurodiagnostic testing she had an essentially normal vestibular assessment with no central peripheral vestibular dysfunction, findings were suggestive of vestibular migraine with poor balance and function, neurodiagnostic testing showed no significant central or peripheral vestibular dysfunction, she does have a history of 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   Started having headaches as a teenager, father with cluster headaches, she was diagnosed with cluster more like someone punching her and hot scalding rags. They went away and then would come back. In her 65s she went to [employer] and she had low vitamin D and pernicious anemia, monthly B12 injections for years since college, She manages it with [name], encouraged her to always keep it above 400 and check MMA. She takes 50k units weekly and last vitamin D was low normal. She had surgery for OSA and no longer needs has OSA. She had an ACDF since I saw her. She is here alone. In the fall she got up and the room was spinning. And then it happened a month later. The first one was the whole day on and off, a month later it was brief and then she started paying more attention she started having more dizziness, no inciting events or new medications, she has been dizzy in the past, in January she was seeing lights prior or with her headaches/migraines and she started thinking about it and realized dizziness with and without the headache. When she bends over she gets woozy and dizzy,  moving her head got dizzy either side, always had motion sickness especially riding in a car. She went to [facility] neurorehab. She tried and could not tolerate vestibular therapy. She feels her eyes are moving and she lost vision. She has headaches, yellow and colors,  pulsating pounding, throbbing, light sensitivity and nausea, they can be moderate to severe, can have the aura without the headache, migraines can last up to 48 hours. Nothing helps. Worsened. Motion is a trigger. Daily headaches. No medication overuse. > 15 migraine days a month. No other focal neurologic deficits, associated symptoms, inciting events or modifiable factors.  Reviewed notes, labs and imaging from outside physicians, which showed:   CT soft tissue neck 10/25/2021: IMPRESSION: No neck mass or adenopathy.  09/2015: MRI brain: IMPRESSION:  This MRI of the brain without contrast shows the following: 1.   Brain volume appears normal. 2.   There is a single T2/FLAIR hyperintense focus in the subcortical white matter of the right parietal lobe consistent with very minimal age-appropriate chronic microvascular ischemic change. 3.   Incidental note is made of a cavum septum pellucidum, a normal variant. 4.   Mild chronic inflammatory changes in the maxillary sinuses, some of the ethmoid air cells and the right hemi-sphenoid. 5.   There are no acute findings    .  From a thorough review of records, medications tried that can be used in headache or migraine management include Tylenol, aspirin, metoprolol, Zofran, Phenergan, amitriptyline and nortriptyline are contraindicated due to age and risk of falls and prior memory complaints, topiramate is also contraindicated due to patient's reported prior memory problems and cognitive decline, also tried meclizine. Triptans contraindicated due to heart disease and coronary artery disease. Aimovig contraindicated due to constipation.   Recent Results (from the past 2160 hour(s))  Vitamin B1     Status: None (Preliminary result)   Collection Time: 11/19/22 10:25 AM  Result Value Ref Range   Thiamine WILL FOLLOW   Basic Metabolic Panel     Status: Abnormal   Collection Time: 11/19/22 10:25 AM  Result Value Ref Range   Glucose 84 70 - 99 mg/dL   BUN  17 8 - 27 mg/dL   Creatinine, Ser 1.61 0.57 - 1.00 mg/dL   eGFR 096 >04 VW/UJW/1.19   BUN/Creatinine Ratio 27 12 - 28   Sodium 144 134 - 144 mmol/L   Potassium 4.3 3.5 - 5.2 mmol/L   Chloride 108 (H) 96 - 106 mmol/L   CO2 24 20 - 29 mmol/L   Calcium 9.1 8.7 - 10.3 mg/dL     Patient complains of symptoms per HPI as well as the following symptoms: migraines . Pertinent negatives and positives per HPI. All others negative    Interval history 01/07/2017: She feels better as far as her memory. She is trying to lose weight. She feels she may have OCD, she will re-read paragraphs over and over, she re-writes lists. It is more "in my head" and not compulsions like repetitive motions. She rechecks things at school. It bothers her, re-reading and double checking everything. It is putting internal stress in her. Discussed  OCD, reviewed DSM criteria.    General history 01/08/2016: [name] returns today to discuss findings from Drs. ZElson is neurocognitive testing. Discussed testing in detail, diagnosis did not include degenerative neurocognitive disorder. Discussion about dementia, her family history of early-onset Alzheimer's, patient was quite relieved that she didn't have dementia although she was a little deflated to hear that menopause and  stress potentially has something to do with her cognitive complaints.   HPI:  Helen Jackson is a 62 y.o. female here as a referral from [name] for memory problems. She has a past medical history of mitral valve prolapse, sensorineural hearing loss, dry eyes, fatty liver, rosacea, actinic keratosis.  She has pernicious anemia. She gets B12 shots and has not been as compliant as she should be. In the last 6-8 months something is not right. She was very sick in her 35s and had a complete workup at [facility] and got better (she had eye movements problems, fatigue, pain,memory problems,difficulty swallowing and eventually diagnosed with severe Vitamin D  Deficiency). But this is different. She is starting to forget what time it it, she can't keep up with time. She started misplacing things 6-8 months ago, missing bills, needs to make lists now, she needs more post-it notes, cognitive changes progressive over the last 6 months. 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 She is at a new school and is doing great but knows she was better. She did very well on the MMSE today but she feels like she hesitated and wasn't as quickly she should've been. The symptoms have progressed over the last 6-8 months, difficulty expressing herslef, word-finding difficulty, forgetting things like tasks she has to do. She is forgetting things people have asked her, forgetting conversations. She is repeating herself at home. No accidents at home or in the car. History of early-onset Alzheimers in the family in the 33s. Mother and grandmother. Denies confusion. Denies hallucination or delusions, no behavioral changes. She is frustrated with her weight. But she denies stress or depression or any mood changes that may be significantly affecting her perceived memory. She has upper airway respirator disease, not OSA, she had a sleep study in the past.  She has occ headaches not significant. No significant alcohol use. Never smoked.    Reviewed notes, labs and imaging from outside physicians, which showed: Patient was evaluated at [facility] in 2014 through 2016 for: for low back pain and left foot pain and paresthesias in the foot as far as focal weakness. Evaluation showed that radiculopathy was less likely given absence of any nerve impingement on MRI. She was referred to nerve conduction EMG regarding these symptoms also thought possible piriformis syndrome. EMG was unremarkable and she was given exercises to perform at home and a left piriformis muscle injection was suggested. She was seen for other multiple complaints including intermittent eyelid spasms which were  described like blepharospasm (worse in bright lights, eyes feel tight, occasionally icing shot) and she was started on clonazepam and Botox was considered. For her mild proptosis TSH she was checked in 2015 was normal and she was followed by ophthalmology. She also complained of fluctuating ptosis which she reported that the be bad for several weeks at a time usually worse in the a.m., no diplopia but had in the past, reported previous problems with dysphasia, single fiber EMG to eval for myasthenia was negative.   Reviewed EMG nerve conduction study performed 12/12/2014: Patient was complaining of chronic, intermittent history of difficulty eye opening in the morning and with bright lights. She exhibited no fatigable ptosis or disconjugate extraocular movements. Intermittent forced eye closure was noted at several points during testing. Single fiber EMG was negative for possible ocular myasthenia gravis.   Apr 23 2015 [facility]:    TSH 1.640 CBC unremarkable HgbA1c 5.7 CMP with slightly elevated AST and  ALT 78 and 85 Creatinine 0.62   Last B12 05/2013 368   MRI of the lumbar spine without contrast December 2014: Unremarkable except for minimal disc bulge without spinal canal or foraminal stenosis at L2-L3. Other than that no significant focal abnormality or any other level.   EMG nerve conduction study in the lower extremities for paresthesias February 2015 was unremarkable.     Review of Systems: Patient complains of symptoms per HPI as well as the following symptoms: Weight gain, fatigue, blurred vision, anemia, chest pain, palpitations, swelling in legs, shortness of breath, snoring, feeling cold, increased thirst, snoring and hearing loss, trouble swallowing, joint pain, cramps, aching muscles, allergies, skin sensitivity, memory loss, numbness, weakness, difficulty swallowing, tremor, snoring.. Pertinent negatives per HPI. All others negative.      Social History   Socioeconomic History    Marital status: Married    Spouse name: Molly Maduro    Number of children: 1   Years of education: 16   Highest education level: Not on file  Occupational History   Occupation: [employer]   Tobacco Use   Smoking status: Never   Smokeless tobacco: Never  Vaping Use   Vaping Use: Never used  Substance and Sexual Activity   Alcohol use: No    Alcohol/week: 0.0 standard drinks of alcohol   Drug use: No   Sexual activity: Yes    Partners: Male    Birth control/protection: Post-menopausal  Other Topics Concern   Not on file  Social History Narrative   Lives with spouse and son   Caffeine use: 2 to 4 cups    Social Determinants of Health   Financial Resource Strain: Not on file  Food Insecurity: Not on file  Transportation Needs: Not on file  Physical Activity: Not on file  Stress: Not on file  Social Connections: Not on file  Intimate Partner Violence: Not on file    Family History  Problem Relation Age of Onset   Osteoporosis Mother    CVA Mother    Dementia Mother    Stroke Mother    Cancer Father        pancreatic   Breast cancer Paternal Aunt    Chorea Maternal Grandmother     Past Medical History:  Diagnosis Date   Arthritis    Celiac disease    Colitis    Complication of anesthesia    difficulty waking up   Diverticulosis    Elevated alkaline phosphatase level    Fatty liver    Fibroid    Heart murmur    Dr. Beatris Si follows   Lichen planus    MVP (mitral valve prolapse)    Ocular rosacea    Peripheral neuropathy    Pernicious anemia    injections every 4 to 6 weeks -last injestion 09-15-14   Seizures (HCC)    see notes in anesthesia   Upper airway resistance syndrome    After sleep study   Upper respiratory disease    upper respiratory airway disease    Past Surgical History:  Procedure Laterality Date   ANKLE SURGERY     CESAREAN SECTION     COLONOSCOPY WITH PROPOFOL N/A 11/28/2014   Procedure: COLONOSCOPY WITH  PROPOFOL;  Surgeon: Charna Elizabeth, MD;  Location: [facility];  Service: Endoscopy;  Laterality: N/A;   ESOPHAGOGASTRODUODENOSCOPY (EGD) WITH PROPOFOL N/A 11/28/2014   Procedure: ESOPHAGOGASTRODUODENOSCOPY (EGD) WITH PROPOFOL;  Surgeon: Charna Elizabeth, MD;  Location: [facility];  Service: Endoscopy;  Laterality:  N/A;   INCISION AND DRAINAGE     bartholin cyst   KNEE SURGERY     bilateral   NASAL SEPTOPLASTY W/ TURBINOPLASTY Bilateral 05/09/2019   Procedure: NASAL SEPTOPLASTY WITH BILATERAL  TURBINATE REDUCTION;  Surgeon: [name], MD;  Location: [facility];  Service: ENT;  Laterality: Bilateral;   NECK SURGERY  2019   Cervical   TONSILLECTOMY      Current Outpatient Medications  Medication Sig Dispense Refill   aspirin EC 81 MG tablet Take 1 tablet (81 mg total) by mouth daily. Swallow whole. 90 tablet 3   Cholecalciferol (VITAMIN D3) 1.25 MG (50000 UT) CAPS Take 50,000 Units by mouth every Sunday.      Cyanocobalamin (B-12) 1000 MCG/ML KIT Inject 1,000 mcg as directed See admin instructions. Every 4-6 weeks     EPIPEN 2-PAK 0.3 MG/0.3ML SOAJ injection Inject 0.3 mg as directed as needed (for allergies/never used). Reported on 08/30/2015     Evolocumab ([name] [place]) Inject into the skin.     Fremanezumab-vfrm (AJOVY) 225 MG/1.5ML SOAJ Inject 225 mg into the skin every 30 (thirty) days. Copay Card BIN#: [id number] PCN#: PDMI GROUP#: [id number] ID#: [id number] EXPIRES: 06/16/2023 1.5 mL 11   metoprolol succinate (TOPROL-XL) 25 MG 24 hr tablet Take 1 tablet (25 mg total) by mouth daily. Take with or immediately following a meal. 90 tablet 1   metroNIDAZOLE (METROGEL) 1 % gel Apply 1 application topically at bedtime as needed (irritation).      ondansetron (ZOFRAN-ODT) 8 MG disintegrating tablet Take 8 mg by mouth every 8 (eight) hours as needed.     Rimegepant Sulfate (NURTEC) 75 MG TBDP Take 1 tablet (75 mg total) by mouth daily as needed. For migraines. Take as close to onset of migraine  as possible. One daily maximum. 10 tablet 6   Current Facility-Administered Medications  Medication Dose Route Frequency Provider Last Rate Last Admin   Fremanezumab-vfrm SOSY 225 mg  225 mg Subcutaneous Once Anson Fret, MD        Allergies as of 11/19/2022 - Review Complete 11/19/2022  Allergen Reaction Noted   Gentamicin Anaphylaxis 03/31/2013   Influenza vaccines Anaphylaxis 07/05/2014   Other Anaphylaxis 07/05/2014   Almond oil  05/25/2013   Amoxicillin Hives 02/21/2014   Doxycycline  02/21/2014   Egg-derived products Nausea And Vomiting 05/25/2013   Hydrocodone-acetaminophen Nausea And Vomiting 12/04/2017   Ibuprofen Itching 08/30/2015   Indomethacin Other (See Comments) 03/31/2013   Morphine Nausea And Vomiting and Other (See Comments) 12/04/2017   Pyridium [phenazopyridine hcl]  03/31/2013   Soybean-containing drug products Other (See Comments) 05/25/2013   Soybeans [soybean oil]  05/09/2019   Tetracyclines & related Hives 02/21/2014   Tramadol Itching 02/21/2014   Wheat  05/25/2013   Erythromycin base Rash 08/30/2015    Vitals: BP 133/73 (BP Location: Left Arm, Patient Position: Sitting, Cuff Size: Small)   Pulse 74   Ht 5\' 3"  (1.6 m)   Wt 194 lb 3.2 oz (88.1 kg)   LMP 07/17/2012   BMI 34.40 kg/m  Last Weight:  Wt Readings from Last 1 Encounters:  11/19/22 194 lb 3.2 oz (88.1 kg)   Last Height:   Ht Readings from Last 1 Encounters:  11/19/22 5\' 3"  (1.6 m)   Physical exam: Exam: Gen: NAD, conversant, well nourised, obese, well groomed                     CV: RRR, no MRG. No Carotid Bruits.  No peripheral edema, warm, nontender Eyes: Conjunctivae clear without exudates or hemorrhage  Neuro: Detailed Neurologic Exam  Speech:    Speech is normal; fluent and spontaneous with normal comprehension.  Cognition:    The patient is oriented to person, place, and time;     recent and remote memory intact;     language fluent;     normal attention,  concentration,     fund of knowledge Cranial Nerves:    The pupils are equal, round, and reactive to light. Attempted, pupils too small to visualize.  Visual fields are full to finger confrontation. Extraocular movements are intact. Trigeminal sensation is intact and the muscles of mastication are normal. The face is symmetric. The palate elevates in the midline. Hearing intact. Voice is normal. Shoulder shrug is normal. The tongue has normal motion without fasciculations.   Coordination: nml  Gait: nml  Motor Observation:    No asymmetry, no atrophy, and no involuntary movements noted. Tone:    Normal muscle tone.    Posture:    Posture is normal. normal erect    Strength:    Strength is V/V in the upper and lower limbs.      Sensation: intact to LT     Reflex Exam:  DTR's:    Absent AJs. Otherwise deep tendon reflexes in the upper and lower extremities are symmetrical bilaterally.   Toes:    The toes are downgoing bilaterally.   Clonus:    Clonus is absent.   Assessment/Plan:  Helen Jackson is a 62 y.o. female here as requested by [name], MD for vestibular migraines.has Mitral valve prolapse; Pernicious anemia; Obesity, Class II, BMI 35-39.9, with comorbidity; Vitamin D insufficiency; Obstructive sleep apnea(resolved); Palpitations; Family history of heart disease; Blepharitis with rosacea; UARS (upper airway resistance syndrome); Word finding difficulty; Cognitive change; Chalazion of right upper eyelid; Insulin resistance; Low back pain; Myopic astigmatism of both eyes; Nonalcoholic fatty liver disease; Cervical spondylosis with radiculopathy(s/p ACDF); Chronic pain of left ankle; and Posterior tibial tendinitis, heart murmur, chronic anal fissure and noted proctitis  on their problem list.  MRI of the brain w/wo contrast: Mri of the brain: MRI brain due to concerning symptoms of morning headaches, positional and exertional headaches,vision changes, worsening headaches   to look for space occupying mass, chiari or intracranial hypertension (pseudotumor), strokes, malignancies, vasculidities, demyelination(multiple sclerosis) or other; MS and stroke protocol and thin cuts through [employer]  Start Ajovy [employer]) prevention Next appointment take emergent management Eye exam recommended  Blood work Acute management: Nurtec once daily as needed samples monthly B12 injections for years since college, She manages it with [name], encouraged her to always keep it above 400 and check MMA.  Orders Placed This Encounter  Procedures   Vitamin B1   Basic Metabolic Panel   Meds ordered this encounter  Medications   Fremanezumab-vfrm (AJOVY) 225 MG/1.5ML SOAJ    Sig: Inject 225 mg into the skin every 30 (thirty) days. Copay Card BIN#: [id number] PCN#: PDMI GROUP#: [id number] ID#: [id number] EXPIRES: 06/16/2023    Dispense:  1.5 mL    Refill:  11    BIN#: [id number] PCN#: PDMI GROUP#: [id number] ID#: [id number] EXPIRES: 06/16/2023   Fremanezumab-vfrm SOSY 225 mg    5-26 [id number]   Rimegepant Sulfate (NURTEC) 75 MG TBDP    Sig: Take 1 tablet (75 mg total) by mouth daily as needed. For migraines. Take as close to onset of migraine as possible. One daily maximum.    Dispense:  10 tablet    Refill:  6    [id number] 10-2024     Naomie Dean, MD   [facility] [address] [place], [place] [address]   Phone [phone] Fax [phone]

## 2022-11-19 NOTE — Patient Instructions (Addendum)
MRI of the brain w/wo contrast Start [facility]) prevention Next appointment take emergent management Eye exam recommended  Blood work Acute management: Nurtec once daily as needed  Rimegepant Disintegrating Tablets What is this medication? RIMEGEPANT (ri ME je pant) prevents and treats migraines. It works by blocking a substance in the body that causes migraines. This medicine may be used for other purposes; ask your health care provider or pharmacist if you have questions. COMMON BRAND NAME(S): NURTEC ODT What should I tell my care team before I take this medication? They need to know if you have any of these conditions: Kidney disease Liver disease An unusual or allergic reaction to rimegepant, other medications, foods, dyes, or preservatives Pregnant or trying to get pregnant Breast-feeding How should I use this medication? Take this medication by mouth. Take it as directed on the prescription label. Leave the tablet in the sealed pack until you are ready to take it. With dry hands, open the pack and gently remove the tablet. If the tablet breaks or crumbles, throw it away. Use a new tablet. Place the tablet in the mouth and allow it to dissolve. Then, swallow it. Do not cut, crush, or chew this medication. You do not need water to take this medication. Talk to your care team about the use of this medication in children. Special care may be needed. Overdosage: If you think you have taken too much of this medicine contact a poison control center or emergency room at once. NOTE: This medicine is only for you. Do not share this medicine with others. What if I miss a dose? This does not apply. This medication is not for regular use. What may interact with this medication? Certain medications for fungal infections, such as fluconazole, itraconazole Rifampin This list may not describe all possible interactions. Give your health care provider a list of all the medicines, herbs,  non-prescription drugs, or dietary supplements you use. Also tell them if you smoke, drink alcohol, or use illegal drugs. Some items may interact with your medicine. What should I watch for while using this medication? Visit your care team for regular checks on your progress. Tell your care team if your symptoms do not start to get better or if they get worse. What side effects may I notice from receiving this medication? Side effects that you should report to your care team as soon as possible: Allergic reactions--skin rash, itching, hives, swelling of the face, lips, tongue, or throat Side effects that usually do not require medical attention (report to your care team if they continue or are bothersome): Nausea Stomach pain This list may not describe all possible side effects. Call your doctor for medical advice about side effects. You may report side effects to FDA at 1-800-FDA-1088. Where should I keep my medication? Keep out of the reach of children and pets. Store at room temperature between 20 and 25 degrees C (68 and 77 degrees F). Get rid of any unused medication after the expiration date. To get rid of medications that are no longer needed or have expired: Take the medication to a medication take-back program. Check with your pharmacy or law enforcement to find a location. If you cannot return the medication, check the label or package insert to see if the medication should be thrown out in the garbage or flushed down the toilet. If you are not sure, ask your care team. If it is safe to put it in the trash, take the medication out of the  container. Mix the medication with cat litter, dirt, coffee grounds, or other unwanted substance. Seal the mixture in a bag or container. Put it in the trash. NOTE: This sheet is a summary. It may not cover all possible information. If you have questions about this medicine, talk to your doctor, pharmacist, or health care provider.  2024 Elsevier/Gold  Standard (2021-07-24 00:00:00)   Vernell Barrier Injection What is this medication? FREMANEZUMAB (fre ma NEZ ue mab) prevents migraines. It works by blocking a substance in the body that causes migraines. It is a monoclonal antibody. This medicine may be used for other purposes; ask your health care provider or pharmacist if you have questions. COMMON BRAND NAME(S): AJOVY What should I tell my care team before I take this medication? They need to know if you have any of these conditions: An unusual or allergic reaction to fremanezumab, other medications, foods, dyes, or preservatives Pregnant or trying to get pregnant Breast-feeding How should I use this medication? This medication is injected under the skin. You will be taught how to prepare and give it. Take it as directed on the prescription label. Keep taking it unless your care team tells you to stop. It is important that you put your used needles and syringes in a special sharps container. Do not put them in a trash can. If you do not have a sharps container, call your pharmacist or care team to get one. Talk to your care team about the use of this medication in children. Special care may be needed. Overdosage: If you think you have taken too much of this medicine contact a poison control center or emergency room at once. NOTE: This medicine is only for you. Do not share this medicine with others. What if I miss a dose? If you miss a dose, take it as soon as you can. If it is almost time for your next dose, take only that dose. Do not take double or extra doses. What may interact with this medication? Interactions are not expected. This list may not describe all possible interactions. Give your health care provider a list of all the medicines, herbs, non-prescription drugs, or dietary supplements you use. Also tell them if you smoke, drink alcohol, or use illegal drugs. Some items may interact with your medicine. What should I watch for  while using this medication? Tell your care team if your symptoms do not start to get better or if they get worse. What side effects may I notice from receiving this medication? Side effects that you should report to your care team as soon as possible: Allergic reactions or angioedema--skin rash, itching or hives, swelling of the face, eyes, lips, tongue, arms, or legs, trouble swallowing or breathing Side effects that usually do not require medical attention (report to your care team if they continue or are bothersome): Pain, redness, or irritation at injection site This list may not describe all possible side effects. Call your doctor for medical advice about side effects. You may report side effects to FDA at 1-800-FDA-1088. Where should I keep my medication? Keep out of the reach of children and pets. Store in a refrigerator or at room temperature between 20 and 25 degrees C (68 and 77 degrees F). Refrigeration (preferred): Store in the refrigerator. Do not freeze. Keep in the original container until you are ready to take it. Remove the dose from the carton about 30 minutes before it is time for you to use it. If the dose is not used,  it may be stored in the original container at room temperature for 7 days. Get rid of any unused medication after the expiration date. Room Temperature: This medication may be stored at room temperature for up to 7 days. Keep it in the original container. Protect from light until time of use. If it is stored at room temperature, get rid of any unused medication after 7 days or after it expires, whichever is first. To get rid of medications that are no longer needed or have expired: Take the medication to a medication take-back program. Check with your pharmacy or law enforcement to find a location. If you cannot return the medication, ask your pharmacist or care team how to get rid of this medication safely. NOTE: This sheet is a summary. It may not cover all  possible information. If you have questions about this medicine, talk to your doctor, pharmacist, or health care provider.  2024 Elsevier/Gold Standard (2021-07-26 00:00:00)

## 2022-11-20 ENCOUNTER — Encounter: Payer: Self-pay | Admitting: Neurology

## 2022-11-21 ENCOUNTER — Ambulatory Visit: Payer: BC Managed Care – PPO

## 2022-11-25 ENCOUNTER — Telehealth: Payer: Self-pay | Admitting: Neurology

## 2022-11-25 LAB — BASIC METABOLIC PANEL
BUN/Creatinine Ratio: 27 (ref 12–28)
BUN: 17 mg/dL (ref 8–27)
CO2: 24 mmol/L (ref 20–29)
Calcium: 9.1 mg/dL (ref 8.7–10.3)
Chloride: 108 mmol/L — ABNORMAL HIGH (ref 96–106)
Creatinine, Ser: 0.62 mg/dL (ref 0.57–1.00)
Glucose: 84 mg/dL (ref 70–99)
Potassium: 4.3 mmol/L (ref 3.5–5.2)
Sodium: 144 mmol/L (ref 134–144)
eGFR: 101 mL/min/{1.73_m2} (ref >59)

## 2022-11-25 LAB — VITAMIN B1: Thiamine: 139.3 nmol/L (ref 66.5–200.0)

## 2022-11-25 NOTE — Telephone Encounter (Signed)
Pt stated she is following-up on schedule MRI. She is requesting a call back,

## 2022-11-26 ENCOUNTER — Ambulatory Visit: Payer: BC Managed Care – PPO | Admitting: Physical Therapy

## 2022-11-28 ENCOUNTER — Encounter: Payer: BC Managed Care – PPO | Admitting: Physical Therapy

## 2022-12-02 ENCOUNTER — Ambulatory Visit (INDEPENDENT_AMBULATORY_CARE_PROVIDER_SITE_OTHER): Payer: BC Managed Care – PPO

## 2022-12-02 DIAGNOSIS — R51 Headache with orthostatic component, not elsewhere classified: Secondary | ICD-10-CM

## 2022-12-02 DIAGNOSIS — H547 Unspecified visual loss: Secondary | ICD-10-CM | POA: Diagnosis not present

## 2022-12-02 DIAGNOSIS — R519 Headache, unspecified: Secondary | ICD-10-CM

## 2022-12-02 DIAGNOSIS — G44009 Cluster headache syndrome, unspecified, not intractable: Secondary | ICD-10-CM

## 2022-12-02 DIAGNOSIS — R42 Dizziness and giddiness: Secondary | ICD-10-CM

## 2022-12-02 MED ORDER — GADOBENATE DIMEGLUMINE 529 MG/ML IV SOLN
15.0000 mL | Freq: Once | INTRAVENOUS | Status: AC | PRN
  Administered 2022-12-02: 15 mL via INTRAVENOUS

## 2022-12-02 NOTE — Therapy (Signed)
OUTPATIENT PHYSICAL THERAPY VESTIBULAR TREATMENT     Patient Name: Helen Jackson MRN: [id number] DOB:12-Dec-1960, 62 y.o., female Today's Date: 12/03/2022  END OF SESSION:  PT End of Session - 12/03/22 1657     Visit Number 8    Number of Visits 17    Date for PT Re-Evaluation 11/28/22    Authorization Type [employer]    Authorization - Number of Visits 30   combined   PT Start Time 1609    PT Stop Time 1657    PT Time Calculation (min) 48 min    Equipment Utilized During Treatment Gait belt    Activity Tolerance Patient tolerated treatment well    Behavior During Therapy WFL for tasks assessed/performed                  Past Medical History:  Diagnosis Date   Arthritis    Celiac disease    Colitis    Complication of anesthesia    difficulty waking up   Diverticulosis    Elevated alkaline phosphatase level    Fatty liver    Fibroid    Heart murmur    Dr. Beatris Si follows   Lichen planus    MVP (mitral valve prolapse)    Ocular rosacea    Peripheral neuropathy    Pernicious anemia    injections every 4 to 6 weeks -last injestion 09-15-14   Seizures (HCC)    see notes in anesthesia   Upper airway resistance syndrome    After sleep study   Upper respiratory disease    upper respiratory airway disease   Past Surgical History:  Procedure Laterality Date   ANKLE SURGERY     CESAREAN SECTION     COLONOSCOPY WITH PROPOFOL N/A 11/28/2014   Procedure: COLONOSCOPY WITH PROPOFOL;  Surgeon: Charna Elizabeth, MD;  Location: [facility];  Service: Endoscopy;  Laterality: N/A;   ESOPHAGOGASTRODUODENOSCOPY (EGD) WITH PROPOFOL N/A 11/28/2014   Procedure: ESOPHAGOGASTRODUODENOSCOPY (EGD) WITH PROPOFOL;  Surgeon: Charna Elizabeth, MD;  Location: [facility];  Service: Endoscopy;  Laterality: N/A;   INCISION AND DRAINAGE     bartholin cyst   KNEE SURGERY     bilateral   NASAL SEPTOPLASTY W/ TURBINOPLASTY Bilateral 05/09/2019   Procedure: NASAL SEPTOPLASTY WITH BILATERAL   TURBINATE REDUCTION;  Surgeon: [name], MD;  Location: [facility];  Service: ENT;  Laterality: Bilateral;   NECK SURGERY  2019   Cervical   TONSILLECTOMY     Patient Active Problem List   Diagnosis Date Noted   Chronic migraine without aura without status migrainosus, not intractable 11/19/2022   Cervical spondylosis with radiculopathy 12/07/2017   Chronic pain of left ankle 10/02/2017   Myopic astigmatism of both eyes 11/14/2015   Word finding difficulty 09/10/2015   Cognitive change 09/10/2015   Insulin resistance 05/31/2015   Nonalcoholic fatty liver disease 05/31/2015   UARS (upper airway resistance syndrome) 07/14/2013   Mitral valve prolapse 06/02/2013   Pernicious anemia 06/02/2013   Obesity, Class II, BMI 35-39.9, with comorbidity 06/02/2013   Vitamin D insufficiency 06/02/2013   Obstructive sleep apnea 06/02/2013   Palpitations 06/02/2013   Family history of heart disease 06/02/2013   Blepharitis with rosacea 06/02/2013   Low back pain 06/01/2013   Chalazion of right upper eyelid 12/29/2012   Posterior tibial tendinitis 10/09/2011    PCP: [name], MD  REFERRING PROVIDER: [name], MD  REFERRING DIAG: R42 (ICD-10-CM) - Dizziness  THERAPY DIAG:  Dizziness and giddiness  Chronic nonintractable headache, unspecified headache type  Unsteadiness on feet  ONSET DATE: 2023  Rationale for Evaluation and Treatment: Rehabilitation  SUBJECTIVE:   SUBJECTIVE STATEMENT: Drove for the first time on Saturday for a short drive. Got very dizzy when looking at the rear view camera and was dizzy when she got out. Also reports that scrolling on a phone or tablet also makes her dizzy. Still sensitive to certain lighting, however she is realizing that this may has been there for a while.    Pt accompanied by: self  PERTINENT HISTORY: Peripheral neuropathy, seizures, ankle surgery, B knee surgery, migraines  Per Dr. Suszanne Conners- "her findings were suggestive  of vestibular migraine, with poor balancing function"  PAIN:  Are you having pain? Yes: NPRS scale: 5-7/10 Pain location: B temples Pain description: Headache without the headache Aggravating factors: nothing Relieving factors: nothing  PRECAUTIONS: None  WEIGHT BEARING RESTRICTIONS: No  FALLS: Has patient fallen in last 6 months? No  LIVING ENVIRONMENT: Lives with: lives with their spouse Lives in: House/apartment Stairs:  3 steps to enter without handrail; 1 story home Has following equipment at home: Single point cane  PLOF: Independent; working full time in an office   PATIENT GOALS:   OBJECTIVE:    TODAY'S TREATMENT: 12/03/22 Activity Comments  Bed mobility  5.5/10 dizziness with most symptoms sitting up   Standing VOR horizontal and vertical 15" 8/10 dizziness and required to sit down with horizontal. Reported intense visual distortions/migraine sx. 6/10 dizziness with vertical   MCTSIB #3 with chair in front Required several trials; Completed 18 sec as best trial before LOB  DGI 18/24  FOTO 43        [facility] PT Assessment - 12/03/22 0001       Dynamic Gait Index   Level Surface Normal    Change in Gait Speed Normal    Gait with Horizontal Head Turns Moderate Impairment    Gait with Vertical Head Turns Moderate Impairment    Gait and Pivot Turn Normal    Step Over Obstacle Normal    Step Around Obstacles Mild Impairment    Steps Mild Impairment    Total Score 18    DGI comment: c/o visual distortions and migraine sx              PATIENT EDUCATION: Education details: edu on progress towards goals and remaining impairments; 30 day hold; edu and handout on how to use optokinetic video training to improve tolerance for driving  Person educated: Patient Education method: Explanation, Demonstration, Tactile cues, Verbal cues, and Handouts Education comprehension: verbalized understanding and returned demonstration     HOME EXERCISE PROGRAM Access  Code: [email or web address] URL: [email or web address] Date: 11/05/2022 Prepared by: [facility] - Outpatient  Rehab - [facility]  Program Notes to improve tolerance for eyes closed: try sitting for 20 sec in chair without back rest, if too hard: try sitting in a chair with back rest, if too hard: lay on back with eyes closed   Exercises - Brandt-Daroff Vestibular Exercise  - 1 x daily - 5 x weekly - 2 sets - 3-5 reps - Seated Left Head Turns Vestibular Habituation  - 1 x daily - 5 x weekly - 2 sets - 30 sec hold - Seated Head Nod  - 1 x daily - 5 x weekly - 2 sets - 30 sec hold - Narrow Stance with Counter Support  - 1 x daily - 5 x weekly - 2 sets - 30 sec hold -  Corner Balance Feet Together With Eyes Closed  - 1 x daily - 7 x weekly - 3 reps - 15-30 sec hold - Semi-Tandem Corner Balance With Eyes Open  - 1 x daily - 7 x weekly - 3 reps - 15-30 sec hold - Standing Anterior Posterior Weight Shift  - 1 x daily - 5 x weekly - 2 sets - 10 reps    Below measures were taken at time of initial evaluation unless otherwise specified:   DIAGNOSTIC FINDINGS: none recent  COGNITION: Overall cognitive status: Within functional limits for tasks assessed   SENSATION: Reports hx of N/T d/t pernicious anemia    POSTURE:  rounded shoulders and elevated shoulders    LOWER EXTREMITY MMT:   MMT Right eval Left eval  Hip flexion    Hip abduction    Hip adduction    Hip internal rotation    Hip external rotation    Knee flexion    Knee extension    Ankle dorsiflexion    Ankle plantarflexion    Ankle inversion    Ankle eversion    (Blank rows = not tested)  GAIT: Gait pattern:  slight anterior trunk lean and hip instability  Assistive device utilized: None Level of assistance: SBA     M-CTSIB  Condition 1: Firm Surface, EO 20 Sec, Mild and Moderate Sway  Condition 2: Firm Surface, EC 3 Sec, Severe Sway; L LOB  Condition 3: Foam Surface, EO - Sec,  NT d/t safety  Sway   Condition 4: Foam Surface, EC - Sec, NT d/t safety Sway  *patient also with L LOB while standing in between tests   PATIENT SURVEYS:  FOTO NT  VESTIBULAR ASSESSMENT:  GENERAL OBSERVATION: wears glasses for astigmatism - reading and driving   OCULOMOTOR EXAM:  Ocular Alignment:  L esotropia   Ocular ROM: No Limitations  Spontaneous Nystagmus: absent  Gaze-Induced Nystagmus: absent  Smooth Pursuits:  1 saccade in each direction and c/o eye and head pain/fatigue  Saccades: intact; c/o dizzy and floaters  Convergence/Divergence: did not report double vision but c/o eye pain/fatigue  VESTIBULAR - OCULAR REFLEX:   Slow VOR: Normal slow and guarded; c/o dizzy and uncomfortable; worse vertical vs. Horizontal   VOR Cancellation: Normal; c/o more intense dizziness and nausea  Head-Impulse Test: HIT Right: positive HIT Left: positive C/o HA after completing this test       POSITIONAL TESTING:  Right Roll Test: negative Left Roll Test: negative Right Sidelying: negative; mild dizziness upon sitting up Left Sidelying: negative; significant dizziness upon sitting up        GOALS: Goals reviewed with patient? Yes  SHORT TERM GOALS: Target date: 10/31/2022  Patient to be independent with initial HEP. Baseline: HEP initiated Goal status: MET    LONG TERM GOALS: Target date: 11/28/2022  Patient to be independent with advanced HEP. Baseline: Not yet initiated  Goal status: IN PROGRESS  Patient to report 0/10 dizziness with standing vertical and horizontal VOR for 30 seconds. Baseline: Unable; completed 15 sec of each with 6-8/10 dizziness 12/03/22 Goal status: IN PROGRESS 12/03/22  Patient will report 0/10 dizziness with bed mobility.  Baseline: Symptomatic ; 5.5/10 dizziness Goal status: IN PROGRESS 12/03/22  Patient to demonstrate mild-moderate sway with M-CTSIB condition with eyes open/foam surface in order to improve safety in environments with uneven  surfaces. Baseline: NT d/t safety; 18 sec 12/03/22 Goal status: IN PROGRESS 12/03/22  Patient to score at least 20/24 on DGI in order to  decrease risk of falls. Baseline: 11; 18/24 12/03/22 Goal status: IN PROGRESS 12/03/22  Patient to score at least 54 on FOTO to indicate improved functional activity tolerance.  Baseline: 39; 43.2779 12/03/22 Goal status: IN PROGRESS 12/03/22  Patient to report tolerance for work day with <4/10 symptoms.  Baseline: unable; 6/10 on average at work 12/03/22 Goal status: IN PROGRESS 12/03/22    ASSESSMENT:  CLINICAL IMPRESSION: Patient arrived to session with report of going on a short drive which brought on symptoms. Patient reports 6-8/10 dizziness with VOR and 5.5/10 with bed mobility. Completed multisensory balance testing with improved balance and 18/24 on DGI, indicating an increased risk of falls but significantly improved since initial assessment. Reports 6/10 dizziness at work at this time. Patient is progressing towards goals, however severity of symptoms is a barrier to progress. Patient requests 30 day hold to allow her to f/u with her cervical surgeon before return.      OBJECTIVE IMPAIRMENTS: Abnormal gait, decreased activity tolerance, decreased balance, dizziness, improper body mechanics, postural dysfunction, and pain.   ACTIVITY LIMITATIONS: carrying, lifting, bending, standing, squatting, sleeping, stairs, transfers, bed mobility, bathing, and reach over head  PARTICIPATION LIMITATIONS: meal prep, cleaning, laundry, driving, shopping, community activity, occupation, yard work, and church  PERSONAL FACTORS: Age, Past/current experiences, Time since onset of injury/illness/exacerbation, Transportation, and 3+ comorbidities: Peripheral neuropathy, seizures, ankle surgery, B knee surgery, migraines   are also affecting patient's functional outcome.   REHAB POTENTIAL: Good  CLINICAL DECISION MAKING: Evolving/moderate complexity  EVALUATION  COMPLEXITY: Moderate   PLAN:  PT FREQUENCY: 2x/week  PT DURATION: 8 weeks  PLANNED INTERVENTIONS: Therapeutic exercises, Therapeutic activity, Neuromuscular re-education, Balance training, Gait training, Patient/Family education, Self Care, Joint mobilization, Stair training, Vestibular training, Canalith repositioning, Aquatic Therapy, Dry Needling, Cryotherapy, Moist heat, Taping, Manual therapy, and Re-evaluation  PLAN FOR NEXT SESSION: 30 day hold at this time   Anette Guarneri, PT, DPT 12/03/22 4:58 PM  [facility] at [facility] [address] [place], [place] [address] Phone # [phone] Fax # [phone]

## 2022-12-03 ENCOUNTER — Encounter: Payer: Self-pay | Admitting: Physical Therapy

## 2022-12-03 ENCOUNTER — Ambulatory Visit: Payer: BC Managed Care – PPO | Admitting: Physical Therapy

## 2022-12-03 DIAGNOSIS — G8929 Other chronic pain: Secondary | ICD-10-CM

## 2022-12-03 DIAGNOSIS — R2681 Unsteadiness on feet: Secondary | ICD-10-CM

## 2022-12-03 DIAGNOSIS — R42 Dizziness and giddiness: Secondary | ICD-10-CM

## 2022-12-10 ENCOUNTER — Ambulatory Visit: Payer: BC Managed Care – PPO | Admitting: Physical Therapy

## 2022-12-11 ENCOUNTER — Other Ambulatory Visit: Payer: Self-pay | Admitting: Orthopaedic Surgery

## 2022-12-11 DIAGNOSIS — M542 Cervicalgia: Secondary | ICD-10-CM

## 2022-12-11 DIAGNOSIS — M5412 Radiculopathy, cervical region: Secondary | ICD-10-CM

## 2022-12-11 DIAGNOSIS — M47892 Other spondylosis, cervical region: Secondary | ICD-10-CM

## 2022-12-12 ENCOUNTER — Encounter: Payer: Self-pay | Admitting: Orthopaedic Surgery

## 2022-12-14 ENCOUNTER — Ambulatory Visit
Admission: RE | Admit: 2022-12-14 | Discharge: 2022-12-14 | Disposition: A | Discharge status: Home / Self Care | Payer: BC Managed Care – PPO | Source: Ambulatory Visit | Attending: Orthopaedic Surgery | Admitting: Orthopaedic Surgery

## 2022-12-14 DIAGNOSIS — M47892 Other spondylosis, cervical region: Secondary | ICD-10-CM

## 2022-12-14 DIAGNOSIS — M542 Cervicalgia: Secondary | ICD-10-CM

## 2022-12-14 DIAGNOSIS — M5412 Radiculopathy, cervical region: Secondary | ICD-10-CM

## 2022-12-23 ENCOUNTER — Ambulatory Visit: Payer: BC Managed Care – PPO | Attending: Otolaryngology | Admitting: Physical Therapy

## 2022-12-23 ENCOUNTER — Encounter: Payer: Self-pay | Admitting: Physical Therapy

## 2022-12-23 DIAGNOSIS — R42 Dizziness and giddiness: Secondary | ICD-10-CM

## 2022-12-23 DIAGNOSIS — G8929 Other chronic pain: Secondary | ICD-10-CM | POA: Diagnosis present

## 2022-12-23 DIAGNOSIS — R519 Headache, unspecified: Secondary | ICD-10-CM | POA: Diagnosis present

## 2022-12-23 DIAGNOSIS — R2681 Unsteadiness on feet: Secondary | ICD-10-CM | POA: Diagnosis present

## 2022-12-23 NOTE — Therapy (Signed)
OUTPATIENT PHYSICAL THERAPY VESTIBULAR DISCHARGE     Patient Name: Helen Jackson MRN: [id number] DOB:27-Jan-1961, 62 y.o., female Today's Date: 12/23/2022  END OF SESSION:  PT End of Session - 12/23/22 1652     Visit Number 9    Number of Visits 17    Date for PT Re-Evaluation 11/28/22    Authorization Type [employer]    Authorization - Number of Visits 30   combined   PT Start Time 1620    PT Stop Time 1649    PT Time Calculation (min) 29 min    Activity Tolerance Patient tolerated treatment well    Behavior During Therapy WFL for tasks assessed/performed                   Past Medical History:  Diagnosis Date   Arthritis    Celiac disease    Colitis    Complication of anesthesia    difficulty waking up   Diverticulosis    Elevated alkaline phosphatase level    Fatty liver    Fibroid    Heart murmur    Dr. Beatris Si follows   Lichen planus    MVP (mitral valve prolapse)    Ocular rosacea    Peripheral neuropathy    Pernicious anemia    injections every 4 to 6 weeks -last injestion 09-15-14   Seizures (HCC)    see notes in anesthesia   Upper airway resistance syndrome    After sleep study   Upper respiratory disease    upper respiratory airway disease   Past Surgical History:  Procedure Laterality Date   ANKLE SURGERY     CESAREAN SECTION     COLONOSCOPY WITH PROPOFOL N/A 11/28/2014   Procedure: COLONOSCOPY WITH PROPOFOL;  Surgeon: Charna Elizabeth, MD;  Location: [facility];  Service: Endoscopy;  Laterality: N/A;   ESOPHAGOGASTRODUODENOSCOPY (EGD) WITH PROPOFOL N/A 11/28/2014   Procedure: ESOPHAGOGASTRODUODENOSCOPY (EGD) WITH PROPOFOL;  Surgeon: Charna Elizabeth, MD;  Location: [facility];  Service: Endoscopy;  Laterality: N/A;   INCISION AND DRAINAGE     bartholin cyst   KNEE SURGERY     bilateral   NASAL SEPTOPLASTY W/ TURBINOPLASTY Bilateral 05/09/2019   Procedure: NASAL SEPTOPLASTY WITH BILATERAL  TURBINATE REDUCTION;  Surgeon: [name], MD;   Location: [facility];  Service: ENT;  Laterality: Bilateral;   NECK SURGERY  2019   Cervical   TONSILLECTOMY     Patient Active Problem List   Diagnosis Date Noted   Chronic migraine without aura without status migrainosus, not intractable 11/19/2022   Cervical spondylosis with radiculopathy 12/07/2017   Chronic pain of left ankle 10/02/2017   Myopic astigmatism of both eyes 11/14/2015   Word finding difficulty 09/10/2015   Cognitive change 09/10/2015   Insulin resistance 05/31/2015   Nonalcoholic fatty liver disease 05/31/2015   UARS (upper airway resistance syndrome) 07/14/2013   Mitral valve prolapse 06/02/2013   Pernicious anemia 06/02/2013   Obesity, Class II, BMI 35-39.9, with comorbidity 06/02/2013   Vitamin D insufficiency 06/02/2013   Obstructive sleep apnea 06/02/2013   Palpitations 06/02/2013   Family history of heart disease 06/02/2013   Blepharitis with rosacea 06/02/2013   Low back pain 06/01/2013   Chalazion of right upper eyelid 12/29/2012   Posterior tibial tendinitis 10/09/2011    PCP: [name], MD  REFERRING PROVIDER: [name], MD  REFERRING DIAG: R42 (ICD-10-CM) - Dizziness  THERAPY DIAG:  Dizziness and giddiness  Chronic nonintractable headache, unspecified headache type  Unsteadiness  on feet  ONSET DATE: 2023  Rationale for Evaluation and Treatment: Rehabilitation  SUBJECTIVE:   SUBJECTIVE STATEMENT: Tried the optokinetic videos and "there was no tolerating that." Reports that she tried a couple trials of driving and was dizzy but not spinning. Has had 2nd injection of Ajvoy which has helped and was able to dim lights at work which has helped. Had cervical MRI and f/u with MD and requesting break from vestibular PT to pursue treatment plan for her neck.    Pt accompanied by: self  PERTINENT HISTORY: Peripheral neuropathy, seizures, ankle surgery, B knee surgery, migraines  Per Dr. Suszanne Conners- "her findings were suggestive of  vestibular migraine, with poor balancing function"  PAIN:  Are you having pain? Yes: NPRS scale: 5-7/10 Pain location: B temples Pain description: Headache without the headache Aggravating factors: nothing Relieving factors: nothing  PRECAUTIONS: None  WEIGHT BEARING RESTRICTIONS: No  FALLS: Has patient fallen in last 6 months? No  LIVING ENVIRONMENT: Lives with: lives with their spouse Lives in: House/apartment Stairs:  3 steps to enter without handrail; 1 story home Has following equipment at home: Single point cane  PLOF: Independent; working full time in an office   PATIENT GOALS:   OBJECTIVE:     TODAY'S TREATMENT: 12/23/22   12/14/22 cervical MRI: Left C4-5 facet edema may be a source of local neck pain. 2. C5-6 ACDF without residual spinal canal or neural foraminal stenosis.   Activity Comments  Sitting R/L elbow prop with gaze on target 3x each  C/o significant dizziness and visual disturbances   Standing with EC with hair in front Posterior LOB, requiring opening her eyes  Standing with EC in corner with chair in front 30" Able to complete 30" sec with frequent LOB posteriorly but able to tolerate better than previous session              HOME EXERCISE PROGRAM Last updated: 12/23/22 Access Code: [email or web address] URL: [email or web address] Date: 12/23/2022 Prepared by: [facility] - Outpatient  Rehab - [facility]  Program Notes to improve tolerance for eyes closed: try sitting for 20 sec in chair without back rest, if too hard: try sitting in a chair with back rest, if too hard: lay on back with eyes closed   Exercises - Brandt-Daroff Vestibular Exercise  - 1 x daily - 5 x weekly - 2 sets - 3-5 reps - Narrow Stance with Counter Support  - 1 x daily - 5 x weekly - 2 sets - 30 sec hold - Semi-Tandem Corner Balance With Eyes Open  - 1 x daily - 7 x weekly - 3 reps - 15-30 sec hold - Standing Anterior Posterior Weight Shift  - 1 x daily - 5 x  weekly - 2 sets - 10 reps - Standing Balance in Corner with Eyes Closed  - 1 x daily - 5 x weekly - 2 sets - 30 sec hold   PATIENT EDUCATION: Education details: HEP update; edu on how to safely progress exercises, edu on chair yoga classes and low impact exercise classes for seniors on [employer] Person educated: Patient Education method: Explanation, Demonstration, Tactile cues, Verbal cues, and Handouts Education comprehension: verbalized understanding and returned demonstration    Below measures were taken at time of initial evaluation unless otherwise specified:   DIAGNOSTIC FINDINGS: none recent  COGNITION: Overall cognitive status: Within functional limits for tasks assessed   SENSATION: Reports hx of N/T d/t pernicious anemia    POSTURE:  rounded shoulders and elevated  shoulders    LOWER EXTREMITY MMT:   MMT Right eval Left eval  Hip flexion    Hip abduction    Hip adduction    Hip internal rotation    Hip external rotation    Knee flexion    Knee extension    Ankle dorsiflexion    Ankle plantarflexion    Ankle inversion    Ankle eversion    (Blank rows = not tested)  GAIT: Gait pattern:  slight anterior trunk lean and hip instability  Assistive device utilized: None Level of assistance: SBA     M-CTSIB  Condition 1: Firm Surface, EO 20 Sec, Mild and Moderate Sway  Condition 2: Firm Surface, EC 3 Sec, Severe Sway; L LOB  Condition 3: Foam Surface, EO - Sec,  NT d/t safety  Sway  Condition 4: Foam Surface, EC - Sec, NT d/t safety Sway  *patient also with L LOB while standing in between tests   PATIENT SURVEYS:  FOTO NT  VESTIBULAR ASSESSMENT:  GENERAL OBSERVATION: wears glasses for astigmatism - reading and driving   OCULOMOTOR EXAM:  Ocular Alignment:  L esotropia   Ocular ROM: No Limitations  Spontaneous Nystagmus: absent  Gaze-Induced Nystagmus: absent  Smooth Pursuits:  1 saccade in each direction and c/o eye and head  pain/fatigue  Saccades: intact; c/o dizzy and floaters  Convergence/Divergence: did not report double vision but c/o eye pain/fatigue  VESTIBULAR - OCULAR REFLEX:   Slow VOR: Normal slow and guarded; c/o dizzy and uncomfortable; worse vertical vs. Horizontal   VOR Cancellation: Normal; c/o more intense dizziness and nausea  Head-Impulse Test: HIT Right: positive HIT Left: positive C/o HA after completing this test       POSITIONAL TESTING:  Right Roll Test: negative Left Roll Test: negative Right Sidelying: negative; mild dizziness upon sitting up Left Sidelying: negative; significant dizziness upon sitting up        GOALS: Goals reviewed with patient? Yes  SHORT TERM GOALS: Target date: 10/31/2022  Patient to be independent with initial HEP. Baseline: HEP initiated Goal status: MET    LONG TERM GOALS: Target date: 11/28/2022  Patient to be independent with advanced HEP. Baseline: Not yet initiated  Goal status: MET 12/23/22  Patient to report 0/10 dizziness with standing vertical and horizontal VOR for 30 seconds. Baseline: Unable; completed 15 sec of each with 6-8/10 dizziness 12/03/22 Goal status: NOT MET 12/03/22  Patient will report 0/10 dizziness with bed mobility.  Baseline: Symptomatic ; 5.5/10 dizziness Goal status: NOT MET 12/03/22  Patient to demonstrate mild-moderate sway with M-CTSIB condition with eyes open/foam surface in order to improve safety in environments with uneven surfaces. Baseline: NT d/t safety; 18 sec 12/03/22 Goal status: NOT MET 12/03/22  Patient to score at least 20/24 on DGI in order to decrease risk of falls. Baseline: 11; 18/24 12/03/22 Goal status: NOT MET 12/03/22  Patient to score at least 54 on FOTO to indicate improved functional activity tolerance.  Baseline: 39; 43.2779 12/03/22 Goal status: NOT MET 12/03/22  Patient to report tolerance for work day with <4/10 symptoms.  Baseline: unable; 6/10 on average at work 12/03/22 Goal  status: NOT MET 12/03/22    ASSESSMENT:  CLINICAL IMPRESSION: Patient arrived to session with report of poor tolerance of optokinetic videos at home. Reports slow transition to driving short distances and notes that meds and modifications are improving activity tolerance. Patient requesting DC today to pursue treatment for her neck. Session focused on review of more challenging  exercises, HEP update and consolidation, and edu on low impact and chair exercise regimens to maintain fitness. Patient reported all edu provided and without complaints upon leaving. Discharging patient at this time. Goals have not been met.     OBJECTIVE IMPAIRMENTS: Abnormal gait, decreased activity tolerance, decreased balance, dizziness, improper body mechanics, postural dysfunction, and pain.   ACTIVITY LIMITATIONS: carrying, lifting, bending, standing, squatting, sleeping, stairs, transfers, bed mobility, bathing, and reach over head  PARTICIPATION LIMITATIONS: meal prep, cleaning, laundry, driving, shopping, community activity, occupation, yard work, and church  PERSONAL FACTORS: Age, Past/current experiences, Time since onset of injury/illness/exacerbation, Transportation, and 3+ comorbidities: Peripheral neuropathy, seizures, ankle surgery, B knee surgery, migraines   are also affecting patient's functional outcome.   REHAB POTENTIAL: Good  CLINICAL DECISION MAKING: Evolving/moderate complexity  EVALUATION COMPLEXITY: Moderate   PLAN:  PT FREQUENCY: 2x/week  PT DURATION: 8 weeks  PLANNED INTERVENTIONS: Therapeutic exercises, Therapeutic activity, Neuromuscular re-education, Balance training, Gait training, Patient/Family education, Self Care, Joint mobilization, Stair training, Vestibular training, Canalith repositioning, Aquatic Therapy, Dry Needling, Cryotherapy, Moist heat, Taping, Manual therapy, and Re-evaluation  PLAN FOR NEXT SESSION: DC at this time    PHYSICAL THERAPY DISCHARGE  SUMMARY  Visits from Start of Care: 9  Current functional level related to goals / functional outcomes: See above clinical impression   Remaining deficits: Dizziness, limited balance, poor activity tolerance   Education / Equipment: HEP  Plan: Patient agrees to discharge.  Patient goals were not met. Patient is being discharged due to her request       Anette Guarneri, PT, DPT 12/23/22 4:52 PM  [facility] at [facility] [address] [place], [address] [place], [place] [address] Phone # [phone] Fax # [phone]

## 2023-01-23 ENCOUNTER — Other Ambulatory Visit: Payer: Self-pay | Admitting: Orthopaedic Surgery

## 2023-01-23 DIAGNOSIS — M542 Cervicalgia: Secondary | ICD-10-CM

## 2023-01-23 DIAGNOSIS — M5412 Radiculopathy, cervical region: Secondary | ICD-10-CM

## 2023-01-23 DIAGNOSIS — M47892 Other spondylosis, cervical region: Secondary | ICD-10-CM

## 2023-01-29 LAB — HM DEXA SCAN

## 2023-02-03 ENCOUNTER — Telehealth: Payer: Self-pay | Admitting: Neurology

## 2023-02-03 NOTE — Telephone Encounter (Signed)
LVM and sent mychart msg informing pt of need to reschedule 04/30/23 appt - MD out

## 2023-02-26 ENCOUNTER — Other Ambulatory Visit: Payer: Self-pay | Admitting: Cardiovascular Disease

## 2023-03-24 ENCOUNTER — Telehealth: Payer: BC Managed Care – PPO | Admitting: Neurology

## 2023-03-31 ENCOUNTER — Ambulatory Visit: Payer: BC Managed Care – PPO | Admitting: Nurse Practitioner

## 2023-04-01 ENCOUNTER — Ambulatory Visit: Payer: BC Managed Care – PPO | Attending: Nurse Practitioner | Admitting: Nurse Practitioner

## 2023-04-01 ENCOUNTER — Encounter: Payer: Self-pay | Admitting: Nurse Practitioner

## 2023-04-01 VITALS — BP 132/86 | HR 59 | Ht 63.0 in | Wt 190.6 lb

## 2023-04-01 DIAGNOSIS — R42 Dizziness and giddiness: Secondary | ICD-10-CM | POA: Diagnosis not present

## 2023-04-01 DIAGNOSIS — I2510 Atherosclerotic heart disease of native coronary artery without angina pectoris: Secondary | ICD-10-CM

## 2023-04-01 DIAGNOSIS — I493 Ventricular premature depolarization: Secondary | ICD-10-CM

## 2023-04-01 DIAGNOSIS — R600 Localized edema: Secondary | ICD-10-CM

## 2023-04-01 DIAGNOSIS — R0609 Other forms of dyspnea: Secondary | ICD-10-CM

## 2023-04-01 DIAGNOSIS — E785 Hyperlipidemia, unspecified: Secondary | ICD-10-CM

## 2023-04-01 DIAGNOSIS — R002 Palpitations: Secondary | ICD-10-CM | POA: Diagnosis not present

## 2023-04-01 DIAGNOSIS — I341 Nonrheumatic mitral (valve) prolapse: Secondary | ICD-10-CM

## 2023-04-01 NOTE — Patient Instructions (Signed)
Medication Instructions:  Your physician recommends that you continue on your current medications as directed. Please refer to the Current Medication list given to you today.  *If you need a refill on your cardiac medications before your next appointment, please call your pharmacy*   Lab Work: BMET, CBC, BNP today  Testing/Procedures: Your physician has requested that you have an echocardiogram. Echocardiography is a painless test that uses sound waves to create images of your heart. It provides your doctor with information about the size and shape of your heart and how well your heart's chambers and valves are working. This procedure takes approximately one hour. There are no restrictions for this procedure. Please do NOT wear cologne, perfume, aftershave, or lotions (deodorant is allowed). Please arrive 15 minutes prior to your appointment time.    Follow-Up: At [facility], you and your health needs are our priority.  As part of our continuing mission to provide you with exceptional heart care, we have created designated Provider Care Teams.  These Care Teams include your primary Cardiologist (physician) and Advanced Practice Providers (APPs -  Physician Assistants and Nurse Practitioners) who all work together to provide you with the care you need, when you need it.  We recommend signing up for the patient portal called "MyChart".  Sign up information is provided on this After Visit Summary.  MyChart is used to connect with patients for Virtual Visits (Telemedicine).  Patients are able to view lab/test results, encounter notes, upcoming appointments, etc.  Non-urgent messages can be sent to your provider as well.   To learn more about what you can do with MyChart, go to [email or web address].    Your next appointment:   2 month(s)  Provider:   Bernadene Person, NP        Other Instructions

## 2023-04-01 NOTE — Progress Notes (Signed)
Office Visit    Patient Name: Helen Jackson Date of Encounter: 04/01/2023  Primary Care Provider:  [name], MD Primary Cardiologist:  Nicki Guadalajara, MD  Chief Complaint    62 year old female with a history of nonobstructive CAD, mitral valve prolapse, hyperlipidemia, palpitations, PVCs, cervical disc disease with surgical repair, nonalcoholic liver disease, COVID pneumonia, Sica syndrome, autoimmune diathesis, and obesity who presents for follow-up related to CAD.   Past Medical History    Past Medical History:  Diagnosis Date   Arthritis    Celiac disease    Colitis    Complication of anesthesia    difficulty waking up   Diverticulosis    Elevated alkaline phosphatase level    Fatty liver    Fibroid    Heart murmur    Dr. Kelly,cardiolgy follows   Lichen planus    MVP (mitral valve prolapse)    Ocular rosacea    Peripheral neuropathy    Pernicious anemia    injections every 4 to 6 weeks -last injestion 09-15-14   Seizures (HCC)    see notes in anesthesia   Upper airway resistance syndrome    After sleep study   Upper respiratory disease    upper respiratory airway disease   Past Surgical History:  Procedure Laterality Date   ANKLE SURGERY     CESAREAN SECTION     COLONOSCOPY WITH PROPOFOL N/A 11/28/2014   Procedure: COLONOSCOPY WITH PROPOFOL;  Surgeon: Charna Elizabeth, MD;  Location: [facility];  Service: Endoscopy;  Laterality: N/A;   ESOPHAGOGASTRODUODENOSCOPY (EGD) WITH PROPOFOL N/A 11/28/2014   Procedure: ESOPHAGOGASTRODUODENOSCOPY (EGD) WITH PROPOFOL;  Surgeon: Charna Elizabeth, MD;  Location: [facility];  Service: Endoscopy;  Laterality: N/A;   INCISION AND DRAINAGE     bartholin cyst   KNEE SURGERY     bilateral   NASAL SEPTOPLASTY W/ TURBINOPLASTY Bilateral 05/09/2019   Procedure: NASAL SEPTOPLASTY WITH BILATERAL  TURBINATE REDUCTION;  Surgeon: [name], MD;  Location: [facility];  Service: ENT;  Laterality: Bilateral;   NECK SURGERY   2019   Cervical   TONSILLECTOMY      Allergies  Allergies  Allergen Reactions   Gentamicin Anaphylaxis   Influenza Vaccines Anaphylaxis   Other Anaphylaxis    Almond-   Almond Oil     almonds   Amoxicillin Hives   Doxycycline     Cant remember   Egg-Derived Products Nausea And Vomiting    11-20-14 Pt. States "had anaphylaxis with flu vaccine shot with egg based"   Hydrocodone-Acetaminophen Nausea And Vomiting    VIOLENTLY SHAKES   Ibuprofen Itching   Indomethacin Other (See Comments)    Stomach pain & black stool   Morphine Nausea And Vomiting and Other (See Comments)    VIOLENTLY SHAKES   Pyridium [Phenazopyridine Hcl]     Cant remember   Soybean-Containing Drug Products Other (See Comments)    Swelling of the tongue   Soybeans [Soybean Oil]    Tetracyclines & Related Hives    swelling   Tramadol Itching    severe   Wheat     Cant remember   Erythromycin Base Rash     Labs/Other Studies Reviewed    The following studies were reviewed today:  Cardiac Studies & Procedures       ECHOCARDIOGRAM  ECHOCARDIOGRAM COMPLETE 12/28/2020  Narrative ECHOCARDIOGRAM REPORT    Patient Name:   Helen Jackson Date of Exam: 12/28/2020 Medical Rec #:  [id number]  Height:       63.0 in Accession #:    [id number]        Weight:       172.8 lb Date of Birth:  11-16-1960        BSA:          1.817 m Patient Age:    59 years          BP:           128/78 mmHg Patient Gender: F                 HR:           74 bpm. Exam Location:  [address]  Procedure: 2D Echo, Cardiac Doppler and Color Doppler  Indications:    R06.00 Dyspnea  History:        Patient has prior history of Echocardiogram examinations, most recent 07/07/2012. Coronary artery calcification on CT, Mitral Valve Prolapse, Signs/Symptoms:Dyspnea and chest tightness; Risk Factors:Hypertension, Obesity and Family History of Coronary Artery Disease. COVID 19 Previous echo revealed LVEF 65%  with systolic bowing of the mitral valve without prolapse.  Sonographer:    Chanetta Marshall [facility], RDCS Referring Phys: [id number] CAITLIN S WALKER  IMPRESSIONS   1. Left ventricular ejection fraction, by estimation, is 60 to 65%. The left ventricle has normal function. The left ventricle has no regional wall motion abnormalities. Left ventricular diastolic parameters were normal. 2. Right ventricular systolic function is normal. The right ventricular size is normal. 3. The MV leaflets are not visualized very well. There appears to be bowing of the MV leaflets without true mitral valve prolapse. . The mitral valve is grossly normal. No evidence of mitral valve regurgitation. 4. The aortic valve is grossly normal. Aortic valve regurgitation is not visualized. No aortic stenosis is present.  FINDINGS Left Ventricle: Left ventricular ejection fraction, by estimation, is 60 to 65%. The left ventricle has normal function. The left ventricle has no regional wall motion abnormalities. The left ventricular internal cavity size was normal in size. There is no left ventricular hypertrophy. Left ventricular diastolic parameters were normal.  Right Ventricle: The right ventricular size is normal. Right vetricular wall thickness was not well visualized. Right ventricular systolic function is normal.  Left Atrium: Left atrial size was normal in size.  Right Atrium: Right atrial size was normal in size.  Pericardium: There is no evidence of pericardial effusion.  Mitral Valve: The MV leaflets are not visualized very well. There appears to be bowing of the MV leaflets without true mitral valve prolapse. The mitral valve is grossly normal. No evidence of mitral valve regurgitation.  Tricuspid Valve: The tricuspid valve is grossly normal. Tricuspid valve regurgitation is trivial.  Aortic Valve: The aortic valve is grossly normal. Aortic valve regurgitation is not visualized. No aortic stenosis is  present.  Pulmonic Valve: The pulmonic valve was grossly normal. Pulmonic valve regurgitation is not visualized.  Aorta: The aortic root and ascending aorta are structurally normal, with no evidence of dilitation.  IAS/Shunts: The atrial septum is grossly normal.   LEFT VENTRICLE PLAX 2D LVIDd:         4.45 cm  Diastology LVIDs:         2.40 cm  LV e' medial:    7.72 cm/s LV PW:         0.75 cm  LV E/e' medial:  8.4 LV IVS:        0.85 cm  LV e' lateral:  7.83 cm/s LVOT diam:     2.10 cm  LV E/e' lateral: 8.3 LV SV:         65 LV SV Index:   36 LVOT Area:     3.46 cm   RIGHT VENTRICLE             IVC RV Basal diam:  3.30 cm     IVC diam: 1.20 cm RV Mid diam:    3.40 cm RV S prime:     14.20 cm/s TAPSE (M-mode): 2.6 cm  LEFT ATRIUM             Index       RIGHT ATRIUM           Index LA diam:        3.50 cm 1.93 cm/m  RA Area:     15.80 cm LA Vol (A2C):   33.7 ml 18.55 ml/m RA Volume:   42.80 ml  23.55 ml/m LA Vol (A4C):   33.7 ml 18.55 ml/m LA Biplane Vol: 34.4 ml 18.93 ml/m AORTIC VALVE LVOT Vmax:   88.60 cm/s LVOT Vmean:  60.167 cm/s LVOT VTI:    0.187 m  AORTA Ao Root diam: 3.00 cm Ao Asc diam:  3.40 cm  MITRAL VALVE MV Area (PHT): 4.49 cm    SHUNTS MV Decel Time: 169 msec    Systemic VTI:  0.19 m MV E velocity: 64.80 cm/s  Systemic Diam: 2.10 cm MV A velocity: 58.30 cm/s MV E/A ratio:  1.11  Kristeen Miss MD Electronically signed by Kristeen Miss MD Signature Date/Time: 12/28/2020/3:53:51 PM    Final     CT SCANS  CT CORONARY MORPH W/CTA COR W/SCORE 11/15/2020  Addendum 11/16/2020  8:33 AM ADDENDUM REPORT: 11/16/2020 08:30  EXAM: OVER-READ INTERPRETATION  CT CHEST  The following report is an over-read performed by radiologist Dr. Leatha Gilding [facility] on 11/16/2020. This over-read does not include interpretation of cardiac or coronary anatomy or pathology. The Coronary CTA interpretation by the cardiologist  is attached.  COMPARISON:  03/07/2020  FINDINGS: The visualized portions of the lower lung fields show no suspicious nodules, masses, or infiltrates. No pleural fluid seen.  The visualized portions of the mediastinum and chest wall are unremarkable.  IMPRESSION: No significant non-cardiovascular abnormality seen in visualized portion of the thorax.   Electronically Signed By: Kennith Center M.D. On: 11/16/2020 08:30  Narrative HISTORY: 62 yo female with chest pain/anginal equiv, 53yr CHD risk 10-20%, not treadmill candidate  EXAM: Cardiac/Coronary CTA  TECHNIQUE: The patient was scanned on a [employer].  PROTOCOL: A 110 kV prospective scan was triggered in the descending thoracic aorta at 111 HU's. Axial non-contrast 3 mm slices were carried out through the heart. The data set was analyzed on a dedicated work station and scored using the Agatson method. Gantry rotation speed was 250 msecs and collimation was .6 mm. Beta blockade and 0.8 mg of sl NTG was given. The 3D data set was reconstructed in 5% intervals of the 67-82 % of the R-R cycle. Diastolic phases were analyzed on a dedicated work station using MPR, MIP and VRT modes. The patient received 95mL of contrast.  FINDINGS: Quality: Excellent, HR 58  Coronary calcium score: The patient's coronary artery calcium score is 147, which places the patient in the 93rd percentile. Calcium noted in the proximal LAD and RCA.  Coronary arteries: Normal coronary origins.  Right dominance.  Right Coronary Artery: Dominant vessel. Minimal 1-24% mixed  proximal stenosis.  Left Main Coronary Artery: Normal. Bifurcates into the LAD and LCx arteries.  Left Anterior Descending Coronary Artery: The LAD is a normal caliber vessel that wraps around the apex. There is minimal to mild mixed non-obstructive proximal LAD stenosis (25-49%). Several small diagonal branches without disease.  Left Circumflex Artery:  Lateral wall LCx which bifurcates in pitchfork fashion. No disease is noted.  Aorta: Normal size, 31 mm at the mid ascending aorta (level of the PA bifurcation) measured double oblique. Aortic atherosclerosis. No dissection.  Aortic Valve: Trileaflet.  No calcifications.  Other findings:  Normal pulmonary vein drainage into the left atrium.  Normal left atrial appendage without a thrombus.  Normal size of the pulmonary artery.  IMPRESSION: 1. Minimal to mild mixed non-obstructive CAD, CADRADS = 2.  2. Coronary calcium score of 147. This was 93rd percentile for age and sex matched control. Calcium noted in the proximal LAD and RCA.  3. Normal coronary origin with right dominance.  4. Aortic atherosclerosis.  5. Aggressive cardiovascular risk factor modification is recommended.  Electronically Signed: By: Chrystie Nose M.D. On: 11/15/2020 17:00         Recent Labs: 11/19/2022: BUN 17; Creatinine, Ser 0.62; Potassium 4.3; Sodium 144  Recent Lipid Panel    Component Value Date/Time   CHOL 161 11/19/2020 0803   TRIG 77 11/19/2020 0803   HDL 53 11/19/2020 0803   CHOLHDL 3.0 11/19/2020 0803   CHOLHDL 3.7 10/11/2013 0819   VLDL 18 10/11/2013 0819   LDLCALC 93 11/19/2020 0803    History of Present Illness    62 year old female with the above past medical history including nonobstructive CAD, mitral valve prolapse, hyperlipidemia, palpitations, PVCs, cervical disc disease with surgical repair, nonalcoholic liver disease, COVID pneumonia, Sica syndrome, autoimmune diathesis, and obesity.   She has a strong family history of premature CAD.  Her mother had a stroke in her 73s and her grandmother and mother both had MI in their 35s.  Previous treadmill stress test was negative.  Echocardiogram in December 2014 showed EF 60 to 65%, with bowing of the mitral valve without definitive prolapse, trivial MR, mild diastolic parameters.  Sleep study showed increased without definitive  sleep apnea. Coronary CTA in 2021 showed two-vessel coronary artery calcification.  He complained of persistent fatigue at a visit in May 2022.  Repeat coronary CT angiogram in June 2022 revealed minimal to mild mixed nonobstructive CAD, coronary calcium score 147, 93rd percentile for age and sex matched control, aggressive risk factor modification was advised.  Echocardiogram at the time showed EF 60 to 65%, bowing of the mitral valve leaflet without true mitral valve prolapse, otherwise no significant valve issue.  Additionally, she has a history of frequent PVCs with transient bigeminy, on metoprolol. She was last seen in the office on 04/22/2022 and was doing well.   She presents today for follow-up.  Since her last visit she has had several concerns.  She was diagnosed with vestibular migraines is now following with neurology.  Since August 2024, she has noted significant fatigue, progressive shortness of breath with exertion, as well as an intermittent "band like" pressure across her chest, not specifically associated with exertion.  She has also noted occasional swelling in her legs and some weight gain, mild orthopnea.  She feels "exhausted" and wants to sleep whenever she has the chance.  She feels that "something is not right."     Home Medications    Current Outpatient Medications  Medication Sig  Dispense Refill   aspirin EC 81 MG tablet Take 1 tablet (81 mg total) by mouth daily. Swallow whole. 90 tablet 3   Cholecalciferol (VITAMIN D3) 1.25 MG (50000 UT) CAPS Take 50,000 Units by mouth every Sunday.      Cyanocobalamin (B-12) 1000 MCG/ML KIT Inject 1,000 mcg as directed See admin instructions. Every 4-6 weeks     EPIPEN 2-PAK 0.3 MG/0.3ML SOAJ injection Inject 0.3 mg as directed as needed (for allergies/never used). Reported on 08/30/2015     Evolocumab ([name] [place]) Inject into the skin.     Fremanezumab-vfrm (AJOVY) 225 MG/1.5ML SOAJ Inject 225 mg into the skin every 30 (thirty) days. Copay  Card BIN#: [id number] PCN#: [id number] GROUP#: [id number] ID#: [id number] EXPIRES: 06/16/2023 1.5 mL 11   metoprolol succinate (TOPROL-XL) 25 MG 24 hr tablet TAKE 1 TABLET BY MOUTH DAILY. TAKE WITH OR IMMEDIATELY FOLLOWING A MEAL. 90 tablet 0   metroNIDAZOLE (METROGEL) 1 % gel Apply 1 application topically at bedtime as needed (irritation).      Rimegepant Sulfate (NURTEC) 75 MG TBDP Take 1 tablet (75 mg total) by mouth daily as needed. For migraines. Take as close to onset of migraine as possible. One daily maximum. 10 tablet 6   ondansetron (ZOFRAN-ODT) 8 MG disintegrating tablet Take 8 mg by mouth every 8 (eight) hours as needed. (Patient not taking: Reported on 04/01/2023)     Current Facility-Administered Medications  Medication Dose Route Frequency Provider Last Rate Last Admin   Fremanezumab-vfrm SOSY 225 mg  225 mg Subcutaneous Once Anson Fret, MD         Review of Systems    She denies pnd, n, v, dizziness, syncope, or early satiety. All other systems reviewed and are otherwise negative except as noted above.   Physical Exam    VS:  BP 132/86 (BP Location: Left Arm, Patient Position: Sitting, Cuff Size: Normal)   Pulse (!) 59   Ht 5\' 3"  (1.6 m)   Wt 190 lb 9.6 oz (86.5 kg)   LMP 07/17/2012   SpO2 100%   BMI 33.76 kg/m  , GEN: Well nourished, well developed, in no acute distress. HEENT: normal. Neck: Supple, no JVD, carotid bruits, or masses. Cardiac: RRR, no murmurs, rubs, or gallops. No clubbing, cyanosis, edema.  Radials/DP/PT 2+ and equal bilaterally.  Respiratory:  Respirations regular and unlabored, clear to auscultation bilaterally. GI: Soft, nontender, nondistended, BS + x 4. MS: no deformity or atrophy. Skin: warm and dry, no rash. Neuro:  Strength and sensation are intact. Psych: Normal affect.  Accessory Clinical Findings    ECG personally reviewed by me today - EKG Interpretation Date/Time:  Wednesday April 01 2023 14:53:38 EDT Ventricular Rate:  59 PR  Interval:  128 QRS Duration:  96 QT Interval:  390 QTC Calculation: 386 R Axis:   62  Text Interpretation: Sinus bradycardia Confirmed by Bernadene Person ([id number]) on 04/01/2023 2:54:36 PM  - no acute changes.   Lab Results  Component Value Date   WBC 3.8 (L) 02/15/2020   HGB 15.2 (H) 02/15/2020   HCT 46.3 (H) 02/15/2020   MCV 103.3 (H) 02/15/2020   PLT 134 (L) 02/15/2020   Lab Results  Component Value Date   CREATININE 0.62 11/19/2022   BUN 17 11/19/2022   NA 144 11/19/2022   K 4.3 11/19/2022   CL 108 (H) 11/19/2022   CO2 24 11/19/2022   Lab Results  Component Value Date   ALT 16 11/19/2020   AST 21 11/19/2020  ALKPHOS 83 11/19/2020   BILITOT 0.5 11/19/2020   Lab Results  Component Value Date   CHOL 161 11/19/2020   HDL 53 11/19/2020   LDLCALC 93 11/19/2020   TRIG 77 11/19/2020   CHOLHDL 3.0 11/19/2020    No results found for: "HGBA1C"  Assessment & Plan    1. Fatigue/dyspnea on exertion/chest pressure/nonobstructive CAD: Most recent coronary CT angiogram in June 2022 revealed minimal to mild mixed nonobstructive CAD, coronary calcium score 147, 93rd percentile for age and sex matched control, aggressive risk factor modification was advised.  Over the past 2 -3 months she has noticed significant fatigue, progressive shortness of breath with exertion, intermittent "bandlike" pressure across her chest, not specifically associated with exertion as well as occasional swelling in her legs, weight gain, and mild orthopnea. Generally euvolemic on exam.  BP and HR stable.  We discussed possible ischemic evaluation given evidence of coronary artery disease on  prior coronary CT angiogram (cardiac PET stress test), however she declines at this time.  Will repeat echocardiogram.  Will check CBC, BMET, BNP today.  If BNP elevated, consider low-dose Lasix.  Reviewed ED precautions, daily weights.  Continue to monitor symptoms.  Continue aspirin, metoprolol, and [name].   2. H/o mitral  valve prolapse: Most recent echo in June 2022 showed EF 60 to 65%, bowing of the mitral valve leaflet without true mitral valve prolapse, otherwise no significant valve issue.  Recent increased fatigue, dyspnea on exertion, lower extremity edema, weight gain, and orthopnea as above.  No significant murmur on exam, generally euvolemic.  Labs, repeat echocardiogram pending as above.     3. Palpitations/PVCs/bigeminy/lightheadedness: She was diagnosed with vestibular migraines since her last visit  (following with neurology). dizziness/lightheadedness has improved with treatment of migraines.  She notes occasional fleeting palpitations, which she describes as a "pounding" in her chest.  Denies any presyncope, syncope.  Overall stable on metoprolol.  4. Hyperlipidemia: LDL was 66 in 01/2023.  Continue aspirin, [name].   5. Disposition: Follow-up in 2 months.       Joylene Grapes, NP 04/01/2023, 4:48 PM

## 2023-04-02 LAB — CBC
Hematocrit: 44.5 % (ref 34.0–46.6)
Hemoglobin: 14.8 g/dL (ref 11.1–15.9)
MCH: 33.9 pg — ABNORMAL HIGH (ref 26.6–33.0)
MCHC: 33.3 g/dL (ref 31.5–35.7)
MCV: 102 fL — ABNORMAL HIGH (ref 79–97)
Platelets: 207 10*3/uL (ref 150–450)
RBC: 4.37 x10E6/uL (ref 3.77–5.28)
RDW: 12.5 % (ref 11.7–15.4)
WBC: 8.9 10*3/uL (ref 3.4–10.8)

## 2023-04-02 LAB — BASIC METABOLIC PANEL
BUN/Creatinine Ratio: 24 (ref 12–28)
BUN: 23 mg/dL (ref 8–27)
CO2: 25 mmol/L (ref 20–29)
Calcium: 9.4 mg/dL (ref 8.7–10.3)
Chloride: 106 mmol/L (ref 96–106)
Creatinine, Ser: 0.97 mg/dL (ref 0.57–1.00)
Glucose: 76 mg/dL (ref 70–99)
Potassium: 5.1 mmol/L (ref 3.5–5.2)
Sodium: 143 mmol/L (ref 134–144)
eGFR: 66 mL/min/{1.73_m2} (ref >59)

## 2023-04-02 LAB — BRAIN NATRIURETIC PEPTIDE: BNP: 17.5 pg/mL (ref 0.0–100.0)

## 2023-04-23 ENCOUNTER — Ambulatory Visit (HOSPITAL_BASED_OUTPATIENT_CLINIC_OR_DEPARTMENT_OTHER): Payer: BC Managed Care – PPO

## 2023-04-23 DIAGNOSIS — R002 Palpitations: Secondary | ICD-10-CM | POA: Diagnosis not present

## 2023-04-23 DIAGNOSIS — R600 Localized edema: Secondary | ICD-10-CM | POA: Diagnosis not present

## 2023-04-23 DIAGNOSIS — I341 Nonrheumatic mitral (valve) prolapse: Secondary | ICD-10-CM

## 2023-04-23 DIAGNOSIS — R0609 Other forms of dyspnea: Secondary | ICD-10-CM

## 2023-04-23 LAB — ECHOCARDIOGRAM COMPLETE
AR max vel: 2.71 cm2
AV Area VTI: 2.78 cm2
AV Area mean vel: 2.71 cm2
AV Mean grad: 4 mm[Hg]
AV Peak grad: 8.6 mm[Hg]
Ao pk vel: 1.47 m/s
Area-P 1/2: 3.53 cm2
MV M vel: 2.25 m/s
MV Peak grad: 20.3 mm[Hg]
S' Lateral: 2.25 cm

## 2023-04-27 ENCOUNTER — Encounter (HOSPITAL_COMMUNITY): Payer: Self-pay

## 2023-04-27 ENCOUNTER — Emergency Department (HOSPITAL_COMMUNITY): Payer: BC Managed Care – PPO

## 2023-04-27 ENCOUNTER — Other Ambulatory Visit: Payer: Self-pay

## 2023-04-27 ENCOUNTER — Emergency Department (HOSPITAL_COMMUNITY)
Admission: EM | Admit: 2023-04-27 | Discharge: 2023-04-27 | Disposition: A | Discharge status: Home / Self Care | Payer: BC Managed Care – PPO | Attending: Emergency Medicine | Admitting: Emergency Medicine

## 2023-04-27 DIAGNOSIS — R200 Anesthesia of skin: Secondary | ICD-10-CM | POA: Diagnosis not present

## 2023-04-27 DIAGNOSIS — F419 Anxiety disorder, unspecified: Secondary | ICD-10-CM | POA: Diagnosis not present

## 2023-04-27 DIAGNOSIS — Z8673 Personal history of transient ischemic attack (TIA), and cerebral infarction without residual deficits: Secondary | ICD-10-CM | POA: Diagnosis not present

## 2023-04-27 DIAGNOSIS — M79602 Pain in left arm: Secondary | ICD-10-CM | POA: Diagnosis not present

## 2023-04-27 DIAGNOSIS — Z79899 Other long term (current) drug therapy: Secondary | ICD-10-CM | POA: Insufficient documentation

## 2023-04-27 DIAGNOSIS — M79601 Pain in right arm: Secondary | ICD-10-CM

## 2023-04-27 DIAGNOSIS — R531 Weakness: Secondary | ICD-10-CM | POA: Diagnosis not present

## 2023-04-27 DIAGNOSIS — M5412 Radiculopathy, cervical region: Secondary | ICD-10-CM

## 2023-04-27 DIAGNOSIS — Z7982 Long term (current) use of aspirin: Secondary | ICD-10-CM | POA: Diagnosis not present

## 2023-04-27 DIAGNOSIS — M47892 Other spondylosis, cervical region: Secondary | ICD-10-CM

## 2023-04-27 DIAGNOSIS — R519 Headache, unspecified: Secondary | ICD-10-CM | POA: Diagnosis present

## 2023-04-27 DIAGNOSIS — M542 Cervicalgia: Secondary | ICD-10-CM

## 2023-04-27 DIAGNOSIS — G43109 Migraine with aura, not intractable, without status migrainosus: Secondary | ICD-10-CM | POA: Diagnosis not present

## 2023-04-27 LAB — CBC
HCT: 44.9 % (ref 36.0–46.0)
Hemoglobin: 15 g/dL (ref 12.0–15.0)
MCH: 34 pg (ref 26.0–34.0)
MCHC: 33.4 g/dL (ref 30.0–36.0)
MCV: 101.8 fL — ABNORMAL HIGH (ref 80.0–100.0)
Platelets: 230 10*3/uL (ref 150–400)
RBC: 4.41 MIL/uL (ref 3.87–5.11)
RDW: 13.5 % (ref 11.5–15.5)
WBC: 9.1 10*3/uL (ref 4.0–10.5)
nRBC: 0 % (ref 0.0–0.2)

## 2023-04-27 LAB — RAPID URINE DRUG SCREEN, HOSP PERFORMED
Amphetamines: NOT DETECTED
Barbiturates: NOT DETECTED
Benzodiazepines: NOT DETECTED
Cocaine: NOT DETECTED
Opiates: NOT DETECTED
Tetrahydrocannabinol: NOT DETECTED

## 2023-04-27 LAB — COMPREHENSIVE METABOLIC PANEL
ALT: 25 U/L (ref 0–44)
AST: 30 U/L (ref 15–41)
Albumin: 4 g/dL (ref 3.5–5.0)
Alkaline Phosphatase: 64 U/L (ref 38–126)
Anion gap: 15 (ref 5–15)
BUN: 18 mg/dL (ref 8–23)
CO2: 18 mmol/L — ABNORMAL LOW (ref 22–32)
Calcium: 9.2 mg/dL (ref 8.9–10.3)
Chloride: 107 mmol/L (ref 98–111)
Creatinine, Ser: 0.84 mg/dL (ref 0.44–1.00)
GFR, Estimated: >60 mL/min (ref >60)
Glucose, Bld: 129 mg/dL — ABNORMAL HIGH (ref 70–99)
Potassium: 3.5 mmol/L (ref 3.5–5.1)
Sodium: 140 mmol/L (ref 135–145)
Total Bilirubin: 0.8 mg/dL (ref <1.2)
Total Protein: 7.4 g/dL (ref 6.5–8.1)

## 2023-04-27 LAB — URINALYSIS, ROUTINE W REFLEX MICROSCOPIC
Bilirubin Urine: NEGATIVE
Glucose, UA: NEGATIVE mg/dL
Hgb urine dipstick: NEGATIVE
Ketones, ur: 20 mg/dL — AB
Leukocytes,Ua: NEGATIVE
Nitrite: NEGATIVE
Protein, ur: NEGATIVE mg/dL
Specific Gravity, Urine: >1.046 — ABNORMAL HIGH (ref 1.005–1.030)
pH: 6 (ref 5.0–8.0)

## 2023-04-27 LAB — I-STAT CHEM 8, ED
BUN: 18 mg/dL (ref 8–23)
Calcium, Ion: 1.07 mmol/L — ABNORMAL LOW (ref 1.15–1.40)
Chloride: 108 mmol/L (ref 98–111)
Creatinine, Ser: 0.7 mg/dL (ref 0.44–1.00)
Glucose, Bld: 126 mg/dL — ABNORMAL HIGH (ref 70–99)
HCT: 46 % (ref 36.0–46.0)
Hemoglobin: 15.6 g/dL — ABNORMAL HIGH (ref 12.0–15.0)
Potassium: 3.5 mmol/L (ref 3.5–5.1)
Sodium: 141 mmol/L (ref 135–145)
TCO2: 18 mmol/L — ABNORMAL LOW (ref 22–32)

## 2023-04-27 LAB — APTT: aPTT: 24 s (ref 24–36)

## 2023-04-27 LAB — PROTIME-INR
INR: 0.9 (ref 0.8–1.2)
Prothrombin Time: 12.5 s (ref 11.4–15.2)

## 2023-04-27 LAB — DIFFERENTIAL
Abs Immature Granulocytes: 0.05 10*3/uL (ref 0.00–0.07)
Basophils Absolute: 0 10*3/uL (ref 0.0–0.1)
Basophils Relative: 0 %
Eosinophils Absolute: 0.1 10*3/uL (ref 0.0–0.5)
Eosinophils Relative: 1 %
Immature Granulocytes: 1 %
Lymphocytes Relative: 41 %
Lymphs Abs: 3.7 10*3/uL (ref 0.7–4.0)
Monocytes Absolute: 0.7 10*3/uL (ref 0.1–1.0)
Monocytes Relative: 8 %
Neutro Abs: 4.6 10*3/uL (ref 1.7–7.7)
Neutrophils Relative %: 49 %

## 2023-04-27 LAB — CBG MONITORING, ED: Glucose-Capillary: 110 mg/dL — ABNORMAL HIGH (ref 70–99)

## 2023-04-27 MED ORDER — METOCLOPRAMIDE HCL 10 MG PO TABS: 10.0000 mg | ORAL_TABLET | Freq: Four times a day (QID) | ORAL | 0 refills | Status: DC

## 2023-04-27 MED ORDER — ONDANSETRON HCL 4 MG/2ML IJ SOLN
4.0000 mg | Freq: Once | INTRAMUSCULAR | Status: AC
  Administered 2023-04-27: 4 mg via INTRAVENOUS

## 2023-04-27 MED ORDER — IOHEXOL 350 MG/ML SOLN
75.0000 mL | Freq: Once | INTRAVENOUS | Status: AC | PRN
  Administered 2023-04-27: 75 mL via INTRAVENOUS

## 2023-04-27 MED ORDER — METOCLOPRAMIDE HCL 10 MG PO TABS
10.0000 mg | ORAL_TABLET | Freq: Once | ORAL | Status: AC
  Administered 2023-04-27: 10 mg via ORAL
  Filled 2023-04-27: qty 1

## 2023-04-27 MED ORDER — DIPHENHYDRAMINE HCL 50 MG/ML IJ SOLN
25.0000 mg | Freq: Once | INTRAMUSCULAR | Status: AC
  Administered 2023-04-27: 25 mg via INTRAVENOUS
  Filled 2023-04-27: qty 1

## 2023-04-27 NOTE — Consult Note (Signed)
[facility] Telemedicine Consult   Requesting Provider: Vonita Moss Consult Participants: Myself, atrium nurse Misty, bedside nurse Sheryle Spray of the provider: [facility] Location of the patient: [facility]   This consult was provided via telemedicine with 2-way video and audio communication. The patient/family was informed that care would be provided in this way and agreed to receive care in this manner.    Chief Complaint: "Headache without a headache"  HPI: Helen Jackson is a 62 y.o. woman with past medical history significant for vestibular migraines, nonalcoholic fatty liver disease, cervical spondylosis s/p ACDF, hyperlipidemia, pernicious anemia, possible seizures during anesthesia, cervical spine ACDF (2019)  Much of the history is provided by EMS as the patient is slightly confused and appears highly anxious.  Reportedly she was at work when at 10:45 AM she began to feel lightheaded, dizzy and had left arm numbness which progressed to right arm numbness, which progressed to pain in the left arm and weakness in the right arm.  There was also concern for possible right facial droop.  She follows with Dr. Lucia Gaskins, last seen 11/19/2022 at which time notes indicated that she has also been evaluated for memory problems with normal formal memory testing (normal cognitive aging).  She has also been seen for recurrent dizziness since fall 2023 accompanied by bilateral aural pressure frequently.  She was unable to tolerate vestibular physical therapy and she has had vision issues with headaches before.  She was started on Ajovy and Nurtec for migraine prevention and abortive respectively  She also follows with cardiology who noted history of nonobstructive coronary artery disease, mitral valve prolapse, palpitations, sicca syndrome, autoimmune diathesis and obesity  LKW: 10:45 AM Thrombolytic given?: No, no stroke on imaging; low concern based on history/exam IR  Thrombectomy? No, exam not c/w LVO Modified Rankin Scale: 0-1 Time of teleneurologist evaluation: 11:46 AM  Exam: There were no vitals filed for this visit.  General: Highly anxious appearing, but pleasant and cooperative though frequently with poor effort/attention/concentration  NIH Stroke scale 1A: Level of Consciousness - 0 1B: Ask Month and Age - 2 notably poor attention/concentration 1C: 'Blink Eyes' & 'Squeeze Hands' - 0 2: Test Horizontal Extraocular Movements - 0 grossly appears to move eyes bilaterally but does not follow finger well 3: Test Visual Fields - 2 (reportedly could only see things in the right lower quadrant on Dr. Norval Gable examination) 4: Test Facial Palsy - not formally tested on video eval, but grossly symmetric during casual speech  5A: Test Left Arm Motor Drift - 1 5B: Test Right Arm Motor Drift - 1 6A: Test Left Leg Motor Drift - 1 6B: Test Right Leg Motor Drift - 2 7: Test Limb Ataxia - 0 tremor of variable amplitude/frequency 8: Test Sensation - 1 " I cannot say if the 2 sides feel equal or not" 9: Test Language/Aphasia- 1 -- variable stutter 10: Test Dysarthria - 0 11: Test Extinction/Inattention - 0  NIHSS score: 11   Imaging Reviewed:   Head CT personally reviewed, agree with radiology:   no acute intracranial process  CTA personally reviewed, agree with radiology:   No LVO on my read, radiology read pending  12/21/2022 MRI C-spine 1. Left C4-5 facet edema may be a source of local neck pain. 2. C5-6 ACDF without residual spinal canal or neural foraminal stenosis.   04/27/2023 MRI brain  Full sequences pending, negative for diffusion restriction on my review   Labs reviewed in epic and pertinent values follow:  Basic Metabolic Panel: Recent Labs  Lab 04/27/23 1149 04/27/23 1154  NA 140 141  K 3.5 3.5  CL 107 108  CO2 18*  --   GLUCOSE 129* 126*  BUN 18 18  CREATININE 0.84 0.70  CALCIUM 9.2  --     CBC: Recent Labs  Lab  04/27/23 1149 04/27/23 1154  WBC 9.1  --   NEUTROABS 4.6  --   HGB 15.0 15.6*  HCT 44.9 46.0  MCV 101.8*  --   PLT 230  --     Coagulation Studies: Recent Labs    04/27/23 1149  LABPROT 12.5  INR 0.9       Assessment: Limited video examination most concerning for nonorganic symptoms, possibly complex migraine versus functional neurologic disorder.  Given her age and risk factors, as well as limitation of examination as described above, advanced imaging was recommended as below  Emergent recommendations: -CT code stroke protocol -CTA head and neck to rule out dissection or LVO -MRI brain to rule out acute stroke -MRI cervical spine to rule out acute cervical process given bilateral arm symptoms and history of ACDF  Further recommendations: -Given MRI brain is negative for stroke, please treat pain/anxiety as needed -Please follow up MRI brain and MRI cervical spine final reports.  If the studies are reassuring she may continue to have outpatient follow-up with Dr. Lucia Gaskins -Consider psychiatric referral to help with stress management, noting that stress/anxiety can also worsen headaches  This patient is receiving care for possible acute neurological changes. There was 50 minutes of care by this provider at the time of service, including time for direct evaluation via telemedicine, review of medical records, imaging studies and discussion of findings with providers, the patient and/or family.  Discussed with Dr. Jarold Motto via video as well as secure chat  Brooke Dare MD-PhD [facility] [phone]   If 8pm-8am, please page neurology on call as listed in AMION.  CRITICAL CARE Performed by: Gordy Councilman   Total critical care time: 50 minutes  Critical care time was exclusive of separately billable procedures and treating other patients.  Critical care was necessary to treat or prevent imminent or life-threatening deterioration --emergent evaluation for  consideration of thrombectomy or thrombolytic  Critical care was time spent personally by me on the following activities: development of treatment plan with patient and/or surrogate as well as nursing, discussions with consultants, evaluation of patient's response to treatment, examination of patient, obtaining history from patient or surrogate, ordering and performing treatments and interventions, ordering and review of laboratory studies, ordering and review of radiographic studies, pulse oximetry and re-evaluation of patient's condition.

## 2023-04-27 NOTE — Progress Notes (Signed)
Code stroke activated at 1133- EMS pre alert.  Teleneurologist paged at 1134 with ETA. Pt arrived with EMS at 1145-Dr. Jarold Motto at bedside. Dr. Iver Nestle on screen at 1146. Pt to CT at 1151 and left CT at 1206 to go to MRI per request Dr. Iver Nestle.

## 2023-04-27 NOTE — ED Notes (Signed)
Called MD prior to arrival

## 2023-04-27 NOTE — ED Notes (Signed)
Pt passed stroke swallow screen  

## 2023-04-27 NOTE — ED Notes (Signed)
Pt currently at MRI at this time.

## 2023-04-27 NOTE — ED Notes (Signed)
Called CT prior to arrival

## 2023-04-27 NOTE — Discharge Instructions (Signed)
You were seen for your headache in the emergency department.  You were given medicines which improved your symptoms. Your MRI did not show a stroke.  At home, please take Tylenol and ibuprofen for your headache.  You may also take the Reglan we have prescribed you for your headache or any nausea or vomiting that you have.  You may take this with Benadryl for severe headaches but please note that this will make you drowsy.    Check your MyChart online for the results of any tests that had not resulted by the time you left the emergency department.   Follow-up with your primary doctor in 2-3 days regarding your visit.  Follow-up with neurology in 1 week and psychiatry when possible. Stress can be related to these headaches so please talk to psychiatry about stress reduction techniques/medications.   Return immediately to the emergency department if you experience any of the following: Vision changes, numbness or weakness of your arms or legs, or any other concerning symptoms.    Thank you for visiting our Emergency Department. It was a pleasure taking care of you today.

## 2023-04-27 NOTE — ED Notes (Signed)
One pair of earrings placed in plastic bag and labeled with pt label, plastic headband placed in pt specific bag

## 2023-04-27 NOTE — ED Triage Notes (Signed)
Pt BIB EMS for possible Code Stroke. Pt experiencing left side weakness and left side facial droop while at work.

## 2023-04-27 NOTE — ED Provider Notes (Signed)
[place] EMERGENCY DEPARTMENT AT [facility] Provider Note   CSN: [id number] Arrival date & time: 04/27/23  1145     History  Chief Complaint  Patient presents with   Code Stroke    Helen Jackson is a 62 y.o. female.  62 year old female with a history of complicated migraines versus TIA who presents to the emergency department with multiple neurologic symptoms.  Patient reports that she was at work and at 1045 started having left arm numbness.  Also started to then have right arm numbness.  Started having right facial numbness and weakness.  Says that her right arm has also started to become weak.  When asked about a headache she says it feels like she has a migraine but without the hurting.  Not on blood thinners.       Home Medications Prior to Admission medications   Medication Sig Start Date End Date Taking? Authorizing Provider  aspirin EC 81 MG tablet Take 1 tablet (81 mg total) by mouth daily. Swallow whole. 11/16/20  Yes Alver Sorrow, NP  Cholecalciferol (VITAMIN D3) 1.25 MG (50000 UT) CAPS Take 50,000 Units by mouth every Sunday.  11/20/18  Yes [provider]  Cyanocobalamin (B-12) 1000 MCG/ML KIT Inject 1,000 mcg as directed See admin instructions. Every 4-6 weeks   Yes [provider]  cyanocobalamin (VITAMIN B12) 1000 MCG/ML injection Inject into the muscle. 04/22/23  Yes [provider]  EPIPEN 2-PAK 0.3 MG/0.3ML SOAJ injection Inject 0.3 mg as directed as needed (for allergies/never used). Reported on 08/30/2015 01/20/14  Yes [provider]  Fremanezumab-vfrm (AJOVY) 225 MG/1.5ML SOAJ Inject 225 mg into the skin every 30 (thirty) days. Copay Card BIN#: [id number] PCN#: PDMI GROUP#: [id number] ID#: [id number] EXPIRES: 06/16/2023 11/19/22  Yes Anson Fret, MD  metoCLOPramide (REGLAN) 10 MG tablet Take 1 tablet (10 mg total) by mouth every 6 (six) hours. 04/27/23  Yes Rondel Baton, MD  metoprolol succinate (TOPROL-XL)  25 MG 24 hr tablet TAKE 1 TABLET BY MOUTH DAILY. TAKE WITH OR IMMEDIATELY FOLLOWING A MEAL. 02/26/23  Yes Lennette Bihari, MD  metroNIDAZOLE (METROGEL) 1 % gel Apply 1 application topically at bedtime as needed (irritation).    Yes [provider]  [name] SURECLICK 140 MG/ML SOAJ Inject into the skin. 04/22/23  Yes [provider]  Rimegepant Sulfate (NURTEC) 75 MG TBDP Take 1 tablet (75 mg total) by mouth daily as needed. For migraines. Take as close to onset of migraine as possible. One daily maximum. 11/19/22  Yes Anson Fret, MD  Evolocumab ([name] [place]) Inject into the skin.    [provider]      Allergies    Gentamicin, Influenza vaccines, Other, Almond oil, Amoxicillin, Doxycycline, Egg-derived products, Hydrocodone-acetaminophen, Ibuprofen, Indomethacin, Morphine, Pyridium [phenazopyridine hcl], Soybean-containing drug products, Soybeans [soybean oil], Tetracyclines & related, Tramadol, Wheat, and Erythromycin base    Review of Systems   Review of Systems  Physical Exam Updated Vital Signs BP 125/74   Pulse 71   Temp 97.9 F (36.6 C) (Oral)   Resp 10   Ht 5\' 3"  (1.6 m)   LMP 07/17/2012   SpO2 99%   BMI 33.76 kg/m  Physical Exam Vitals and nursing note reviewed.  Constitutional:      General: She is in acute distress.     Appearance: She is well-developed. She is ill-appearing.     Comments: Very anxious appearing  HENT:     Head: Normocephalic and atraumatic.  Right Ear: External ear normal.     Left Ear: External ear normal.     Nose: Nose normal.  Eyes:     Comments: Patient refusing to open her eyes because she says that she feels dizzy.  Unable to cooperate with EOM or visual fields.  Pulmonary:     Effort: Pulmonary effort is normal. No respiratory distress.  Musculoskeletal:     Cervical back: Normal range of motion and neck supple.     Right lower leg: No edema.     Left lower leg: No edema.  Skin:    General: Skin is warm  and dry.  Neurological:     Mental Status: She is alert.     Comments: Patient very anxious which limits her neurologic exam.  Appears to have some mild right-sided facial droop.  Unsure if she has diminished sensation of her face.  Difficulty cooperating with strength exam as well.  Psychiatric:        Mood and Affect: Mood normal.     ED Results / Procedures / Treatments   Labs (all labs ordered are listed, but only abnormal results are displayed) Labs Reviewed  CBC - Abnormal; Notable for the following components:      Result Value   MCV 101.8 (*)    All other components within normal limits  COMPREHENSIVE METABOLIC PANEL - Abnormal; Notable for the following components:   CO2 18 (*)    Glucose, Bld 129 (*)    All other components within normal limits  URINALYSIS, ROUTINE W REFLEX MICROSCOPIC - Abnormal; Notable for the following components:   Specific Gravity, Urine >1.046 (*)    Ketones, ur 20 (*)    All other components within normal limits  I-STAT CHEM 8, ED - Abnormal; Notable for the following components:   Glucose, Bld 126 (*)    Calcium, Ion 1.07 (*)    TCO2 18 (*)    Hemoglobin 15.6 (*)    All other components within normal limits  CBG MONITORING, ED - Abnormal; Notable for the following components:   Glucose-Capillary 110 (*)    All other components within normal limits  PROTIME-INR  APTT  DIFFERENTIAL  RAPID URINE DRUG SCREEN, HOSP PERFORMED    EKG None  Radiology MR CERVICAL SPINE WO CONTRAST  Result Date: 04/27/2023 CLINICAL DATA:  Cervical radiculopathy, prior cervical surgery. Left-sided weakness and numbness. EXAM: MRI CERVICAL SPINE WITHOUT CONTRAST TECHNIQUE: Multiplanar, multisequence MR imaging of the cervical spine was performed. No intravenous contrast was administered. COMPARISON:  Cervical spine MRI 12/14/2022 FINDINGS: Alignment: Chronic straightening of the normal cervical lordosis with trace anterolisthesis of C3 on C4, C4 on C5, and C7 on  T1. Vertebrae: Persistent left facet edema at C4-5. Solid C5-6 ACDF. No fracture or suspicious marrow lesion. Cord: Normal signal and morphology. Posterior Fossa, vertebral arteries, paraspinal tissues: Posterior fossa reported on today's separate head MRI. Preserved vertebral artery flow voids. Disc levels: C2-3: Minimal right and mild-to-moderate left facet arthrosis without disc herniation or stenosis, unchanged. C3-4: Trace anterolisthesis with disc uncovering and severe right facet arthrosis result in mild right neural foraminal stenosis without spinal stenosis, unchanged. C4-5: Mild disc space narrowing. Trace anterolisthesis with uncovered, bulging disc, asymmetric left uncovertebral spurring, and mild right and severe left facet arthrosis result in mild-to-moderate left neural foraminal stenosis without spinal stenosis, unchanged. C5-6: ACDF.  No stenosis. C6-7: Moderate disc space narrowing. Mild disc bulging, uncovertebral spurring, and severe left facet arthrosis result in at most mild left  neural foraminal stenosis, unchanged. There is an unchanged small left paracentral disc extrusion with mild caudal migration. No significant spinal stenosis. C7-T1: Trace anterolisthesis with disc uncovering and severe bilateral facet arthrosis without significant stenosis, unchanged. T1-2: Small left central disc protrusion and moderate left facet arthrosis without stenosis, unchanged. IMPRESSION: 1. Unchanged multilevel cervical disc degeneration and severe facet arthrosis without spinal stenosis. 2. Mild-to-moderate left neural foraminal stenosis at C4-5. 3. Solid C5-6 ACDF. Electronically Signed   By: Sebastian Ache M.D.   On: 04/27/2023 13:02   MR BRAIN WO CONTRAST  Result Date: 04/27/2023 CLINICAL DATA:  Neuro deficit, acute, stroke suspected. Left-sided weakness, numbness, and facial droop. Dizziness. EXAM: MRI HEAD WITHOUT CONTRAST TECHNIQUE: Multiplanar, multiecho pulse sequences of the brain and  surrounding structures were obtained without intravenous contrast. COMPARISON:  Head MRI 12/02/2022.  Head CT and CTA 04/27/2023. FINDINGS: Brain: There is no evidence of an acute infarct, intracranial hemorrhage, mass, midline shift, or extra-axial fluid collection. A normal variant cavum septum pellucidum et vergae is again noted. Cerebral volume is normal with normal ventricular size. Scattered punctate T2 hyperintensities in the cerebral white matter similar to the prior MRI and are nonspecific but compatible with mild chronic small vessel ischemic disease. 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 Vascular: Major intracranial vascular flow voids are preserved. Skull and upper cervical spine: Unremarkable bone marrow signal. Sinuses/Orbits: Unremarkable orbits. Small left maxillary sinus mucous retention cyst. Bilateral posterior ethmoid air cell opacification. Clear mastoid air cells. Other: None. IMPRESSION: 1. No acute intracranial abnormality. 2. Mild chronic small vessel ischemic disease. Electronically Signed   By: Sebastian Ache M.D.   On: 04/27/2023 12:52   CT ANGIO HEAD NECK W WO CM (CODE STROKE)  Result Date: 04/27/2023 CLINICAL DATA:  Neuro deficit, acute, stroke suspected. Left-sided weakness, numbness, and facial droop. Dizziness. EXAM: CT ANGIOGRAPHY HEAD AND NECK WITH AND WITHOUT CONTRAST TECHNIQUE: Multidetector CT imaging of the head and neck was performed using the standard protocol during bolus administration of intravenous contrast. Multiplanar CT image reconstructions and MIPs were obtained to evaluate the vascular anatomy. Carotid stenosis measurements (when applicable) are obtained utilizing NASCET criteria, using the distal internal carotid diameter as the denominator. RADIATION DOSE REDUCTION: This exam was performed according to the departmental dose-optimization program which includes automated exposure control, adjustment of the mA and/or kV according to patient size  and/or use of iterative reconstruction technique. CONTRAST:  75mL OMNIPAQUE IOHEXOL 350 MG/ML SOLN COMPARISON:  None Available. FINDINGS: CTA NECK FINDINGS Aortic arch: Normal variant aortic arch branching pattern with common origin of the brachiocephalic and left common carotid arteries. Widely patent arch vessel origins. Right carotid system: Patent without evidence of stenosis, dissection, or significant atherosclerosis. Left carotid system: Patent without evidence of stenosis, dissection, or significant atherosclerosis. Vertebral arteries: Patent and codominant without evidence of stenosis, dissection, or significant atherosclerosis. Skeleton: Solid C5-6 ACDF. Severe facet arthrosis on the right at C3-4 and on the left at C4-5 with trace anterolisthesis at these levels. Other neck: No evidence of cervical lymphadenopathy or mass. Upper chest: Clear lung apices. Review of the MIP images confirms the above findings CTA HEAD FINDINGS Anterior circulation: The internal carotid arteries are patent from skull base to carotid termini with minimal nonstenotic atherosclerosis bilaterally. ACAs and MCAs are patent without evidence of a proximal branch occlusion or significant proximal stenosis. No aneurysm is identified. Posterior circulation: The intracranial vertebral arteries are widely patent to the basilar. Patent PICA and SCA origins are visualized bilaterally.  The basilar artery is widely patent. Posterior communicating arteries are diminutive or absent. Both PCAs are patent without evidence of a significant proximal stenosis. No aneurysm is identified. Venous sinuses: As permitted by contrast timing, patent. Anatomic variants: None. Review of the MIP images confirms the above findings These results were communicated to Dr. Iver Nestle at 12:06 pm on 04/27/2023 by text page via the [facility] messaging system. IMPRESSION: No large vessel occlusion or significant stenosis in the head or neck. Electronically Signed   By: Sebastian Ache M.D.   On: 04/27/2023 12:33   CT HEAD CODE STROKE WO CONTRAST  Result Date: 04/27/2023 CLINICAL DATA:  Code stroke. Left-sided weakness, numbness, and facial droop. Dizziness. EXAM: CT HEAD WITHOUT CONTRAST TECHNIQUE: Contiguous axial images were obtained from the base of the skull through the vertex without intravenous contrast. RADIATION DOSE REDUCTION: This exam was performed according to the departmental dose-optimization program which includes automated exposure control, adjustment of the mA and/or kV according to patient size and/or use of iterative reconstruction technique. COMPARISON:  Head MRI 12/02/2022 FINDINGS: Brain: There is no evidence of an acute infarct, intracranial hemorrhage, mass, midline shift, or extra-axial fluid collection. The ventricles are normal in size. A cavum septum pellucidum et vergae is again noted, a normal variant. Vascular: Calcified atherosclerosis at the skull base. No hyperdense vessel. Skull: No acute fracture or suspicious osseous lesion. Sinuses/Orbits: Bilateral ethmoid air cell mucosal thickening. Mucous retention cyst in the left maxillary sinus. Unremarkable orbits. Other: None. ASPECTS ([place] Stroke Program Early CT Score) - Ganglionic level infarction (caudate, lentiform nuclei, internal capsule, insula, M1-M3 cortex): 7 - Supraganglionic infarction (M4-M6 cortex): 3 Total score (0-10 with 10 being normal): 10 These results were communicated to Dr. Iver Nestle at 12:06 pm on 04/27/2023 by text page via the [facility] messaging system. IMPRESSION: No evidence of acute intracranial abnormality. ASPECTS of 10. Electronically Signed   By: Sebastian Ache M.D.   On: 04/27/2023 12:06    Procedures Procedures    Medications Ordered in ED Medications  iohexol (OMNIPAQUE) 350 MG/ML injection 75 mL (75 mLs Intravenous Contrast Given 04/27/23 1209)  ondansetron (ZOFRAN) injection 4 mg (4 mg Intravenous Given 04/27/23 1146)  metoCLOPramide (REGLAN) tablet 10 mg (10 mg  Oral Given 04/27/23 1251)  diphenhydrAMINE (BENADRYL) injection 25 mg (25 mg Intravenous Given 04/27/23 1250)    ED Course/ Medical Decision Making/ A&P Clinical Course as of 04/27/23 1834  Mon Apr 27, 2023  1251 Dr Iver Nestle [RP]    Clinical Course User Index [RP] Rondel Baton, MD                                 Medical Decision Making Amount and/or Complexity of Data Reviewed Labs: ordered. Radiology: ordered.  Risk Prescription drug management.   CHELSEY RAYCRAFT is a 62 y.o. female with comorbidities that complicate the patient evaluation including complicated migraines versus TIA who presents emergency department with anxiety and multiple neurologic symptoms  Initial Ddx:  Stroke, ICH, complicated migraine, panic attack, electrolyte abnormality  MDM/Course:  Patient presents to the emergency department with bilateral arm numbness as well as reported arm weakness and facial droop.  Says that she feels like she has a migraine without the hurting.  Very difficult to get an initial neurologic exam on the patient and she appears very anxious.  Code stroke was activated in the field by EMS since she is within the TNK window.  Dr. Iver Nestle from neurology has evaluated the patient and recommends CT head with CT angio.  She was taken immediately to MRI since her symptoms were thought to be largely nonorganic.  MRI was negative for stroke.  She received a migraine cocktail and upon re-evaluation was able to comply with a full neurologic exam.  Cranial nerves II through XII are intact.  EOM intact.  Pupils 4 mm and reactive bilaterally.  Intact finger-to-nose and heel-to-shin.  Intact sensation to light touch of the upper and lower extremities.  5/5 strength in bilateral upper and lower extremities.  Feels she likely had a complicated migraine.  Was instructed to take Tylenol and ibuprofen for symptoms and Reglan if she should develop a severe headache.  Will have her follow-up with  neurology.  Neurology also felt that she should follow-up with psychiatry to discuss stress reduction techniques and her anxiety.  This patient presents to the ED for concern of complaints listed in HPI, this involves an extensive number of treatment options, and is a complaint that carries with it a high risk of complications and morbidity. Disposition including potential need for admission considered.   Dispo: DC Home. Return precautions discussed including, but not limited to, those listed in the AVS. Allowed pt time to ask questions which were answered fully prior to dc.  Additional history obtained from EMS Records reviewed Outpatient Clinic Notes The following labs were independently interpreted: Chemistry and show no acute abnormality I independently reviewed the following imaging with scope of interpretation limited to determining acute life threatening conditions related to emergency care: CT Head and agree with the radiologist interpretation with the following exceptions: none I personally reviewed and interpreted cardiac monitoring: normal sinus rhythm  I personally reviewed and interpreted the pt's EKG: see above for interpretation  I have reviewed the patients home medications and made adjustments as needed Consults: Neurology  Portions of this note were generated with Dragon dictation software. Dictation errors may occur despite best attempts at proofreading.     CRITICAL CARE Performed by: Rondel Baton   Total critical care time: 30 minutes  Critical care time was exclusive of separately billable procedures and treating other patients.  Critical care was necessary to treat or prevent imminent or life-threatening deterioration.  Critical care was time spent personally by me on the following activities: development of treatment plan with patient and/or surrogate as well as nursing, discussions with consultants, evaluation of patient's response to treatment, examination  of patient, obtaining history from patient or surrogate, ordering and performing treatments and interventions, ordering and review of laboratory studies, ordering and review of radiographic studies, pulse oximetry and re-evaluation of patient's condition.   Final Clinical Impression(s) / ED Diagnoses Final diagnoses:  Complicated migraine  Numbness  Weakness    Rx / DC Orders ED Discharge Orders          Ordered    metoCLOPramide (REGLAN) 10 MG tablet  Every 6 hours        04/27/23 1410              Rondel Baton, MD 04/27/23 [phone]

## 2023-04-27 NOTE — ED Notes (Signed)
Patient transported to MRI 

## 2023-04-27 NOTE — ED Notes (Signed)
Tele neurologist cancelled code stroke, states we should Tx pts anxiety, Scan ordered for C spine

## 2023-04-27 NOTE — ED Notes (Signed)
Earrings and glasses given to pts husband at bedside, pt still in MRI at this time

## 2023-04-27 NOTE — ED Notes (Signed)
Pt ambulated several feet to the BR without assistance

## 2023-04-28 ENCOUNTER — Encounter: Payer: Self-pay | Admitting: Neurology

## 2023-04-30 ENCOUNTER — Ambulatory Visit: Payer: BC Managed Care – PPO | Admitting: Neurology

## 2023-05-13 ENCOUNTER — Telehealth: Payer: Self-pay

## 2023-05-13 NOTE — Telephone Encounter (Signed)
Transition Care Management Unsuccessful Follow-up Telephone Call  Date of discharge and from where:  04/27/2023 [facility]  Attempts:  1st Attempt  Reason for unsuccessful TCM follow-up call:  Left voice message  Bastion Bolger [facility]  [facility] Resource Care Guide Direct Dial: [phone]  Website: [email or web address]

## 2023-05-18 ENCOUNTER — Telehealth: Payer: Self-pay

## 2023-05-18 NOTE — Telephone Encounter (Signed)
Transition Care Management Unsuccessful Follow-up Telephone Call  Date of discharge and from where:  04/27/2023   Attempts:  2nd Attempt  Reason for unsuccessful TCM follow-up call:  Left voice message  Genia Perin [facility]  [facility] Guide Direct Dial: [phone]  Website: [email or web address]

## 2023-05-21 ENCOUNTER — Ambulatory Visit: Payer: BC Managed Care – PPO | Admitting: Nurse Practitioner

## 2023-05-25 ENCOUNTER — Other Ambulatory Visit: Payer: Self-pay | Admitting: Cardiovascular Disease

## 2023-06-17 DIAGNOSIS — R232 Flushing: Secondary | ICD-10-CM

## 2023-06-17 HISTORY — DX: Flushing: R23.2

## 2023-07-06 ENCOUNTER — Ambulatory Visit (INDEPENDENT_AMBULATORY_CARE_PROVIDER_SITE_OTHER): Payer: BC Managed Care – PPO | Admitting: Neurology

## 2023-07-06 ENCOUNTER — Encounter: Payer: Self-pay | Admitting: Neurology

## 2023-07-06 VITALS — BP 128/80 | HR 70 | Ht 63.75 in | Wt 202.0 lb

## 2023-07-06 DIAGNOSIS — G43709 Chronic migraine without aura, not intractable, without status migrainosus: Secondary | ICD-10-CM

## 2023-07-06 MED ORDER — GALCANEZUMAB-GNLM 120 MG/ML ~~LOC~~ SOAJ: 240.0000 mg | Freq: Once | SUBCUTANEOUS | Status: DC

## 2023-07-06 MED ORDER — EMGALITY 120 MG/ML ~~LOC~~ SOAJ: 120.0000 mg | SUBCUTANEOUS | 0 refills | Status: DC

## 2023-07-06 NOTE — Progress Notes (Signed)
[facility]    Provider:  Dr Helen Jackson Referring Provider: [name], MD Primary Care Physician:  Helen Penna, MD    CC:  vestibular migraines  07/06/2023: We started her on Ajovy and she is moving in the right direction. Still having the dizziness with and without the migraines. Pain is always in the right eye behind the right eye and went to the ED thinking she was having a stroke or TIA and diagnosed with a complicated migraine vs TIA more likely complicated migraine.  Ajovy has helped with the frequency but she still feels she has 15 total headache days a month, less dizzy days, >8 total migraine days a month. Nurtec did not help. She has had problems with contipation, "serious problems" per patient.  .  From a thorough review of records, medications tried that can be used in headache or migraine management include Tylenol, aspirin, metoprolol, Zofran, lyrica, gabapentin, nortriptyline(memory problems), Phenergan, amitriptyline and nortriptyline are contraindicated due to age and risk of falls and prior memory complaints, topiramate is also contraindicated due to patient's reported prior memory problems and cognitive decline, also tried meclizine. Triptans contraindicated due to heart disease and coronary artery disease. Aimovig contraindicated due to constipation. Ajovy helped 50%. Will try emgality and then go to [facility]. She has had problems with contipation, "serious problems" per patient so ocntaindicated is aimovig and qulipta.   Patient complains of symptoms per HPI as well as the following symptoms: none . Pertinent negatives and positives per HPI. All others negative   07/06/2023: Patient here for follow up. At last visit we started c grp medication for her migraines possibly with vestibular symptoms. MRI brain IAC protocol was unremarkable. CTA H&N 04/27/2023 unremarkable.  He presented to the emergency room April 27, 2019 for with multiple neurologic  symptoms, including left arm numbness, right arm numbness, facial numbness and weakness, right arm weakness, felt like she had a migraine but without the hurting.  Patient was very anxious.  Code stroke was activated.  She received a migraine cocktail and upon reevaluation was able to comply with a full neurologic exam.  Neurologic exam was normal likely complicated migraine.  She was also advised to follow-up with psychiatry to discuss stress reduction techniques and her anxiety.  MRI brain 04/27/2023: 1. No acute intracranial abnormality. 2. Mild chronic small vessel ischemic disease.  CTA Head and Neck 04/27/2023: IMPRESSION: No large vessel occlusion or significant stenosis in the head or neck.    HPI 11/19/2022:  Helen Jackson is a 63 y.o. female here as requested by Helen Pies, MD for vestibular migraines.has Mitral valve prolapse; Pernicious anemia; Obesity, Class II, BMI 35-39.9, with comorbidity; Vitamin D insufficiency; Obstructive sleep apnea(resolved); Palpitations; Family history of heart disease; Blepharitis with rosacea; UARS (upper airway resistance syndrome); Word finding difficulty; Cognitive change; Chalazion of right upper eyelid; Insulin resistance; Low back pain; Myopic astigmatism of both eyes; Nonalcoholic fatty liver disease; Cervical spondylosis with radiculopathy(s/p ACDF); Chronic pain of left ankle; and Posterior tibial tendinitis, heart murmur, chronic anal fissure and noted proctitis  on their problem list.  We have seen her in the past for memory problems, she had a workup including formal memory testing which showed normal cognitive aging.  At that time we ordered an MRI of the brain which was unremarkable.  I reviewed my past medical notes and imaging.  When we saw her in 2017 thyroid panel was normal, methylmalonic acid was normal, HIV was negative, RPR nonreactive, B12 and folate panel  showed vitamin B12 320 (but MMA was normal), folate was normal.  In July 2018  vitamin D was in the low normal range of 30.6.  She was referred by Helen. Jodean Jackson however I do not see any notes from him on [facility].  I do see Helen. Alphonsus Jackson last note April 2024 on "Care Everywhere" but I do not see any details.  I do not see any recent brain imaging on "Care Everywhere" and recent labs from [facility] are from 2022. I reviewed his notes: She was previously seen for recurrent dizziness, the patient has also been experiencing recurrent dizziness since fall 2023 often accompanied by bilateral aural pressure, she also has migraine headaches with light and sound sensitivity, in between vertigo episodes the patient also has difficulty with her balance, she recently underwent vestibular neurodiagnostic testing she had an essentially normal vestibular assessment with no central peripheral vestibular dysfunction, findings were suggestive of vestibular migraine with poor balance and function, neurodiagnostic testing showed no significant central or peripheral vestibular dysfunction, she does have a history of 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   Started having headaches as a teenager, father with cluster headaches, she was diagnosed with cluster more like someone punching her and hot scalding rags. They went away and then would come back. In her 68s she went to [employer] and she had low vitamin D and pernicious anemia, monthly B12 injections for years since college, She manages it with Helen. Link Jackson, encouraged her to always keep it above 400 and check MMA. She takes 50k units weekly and last vitamin D was low normal. She had surgery for OSA and no longer needs has OSA. She had an ACDF since I saw her. She is here alone. In the fall she got up and the room was spinning. And then it happened a month later. The first one was the whole day on and off, a month later it was brief and then she started paying more attention she started having more  dizziness, no inciting events or new medications, she has been dizzy in the past, in January she was seeing lights prior or with her headaches/migraines and she started thinking about it and realized dizziness with and without the headache. When she bends over she gets woozy and dizzy, moving her head got dizzy either side, always had motion sickness especially riding in a car. She went to [facility] neurorehab. She tried and could not tolerate vestibular therapy. She feels her eyes are moving and she lost vision. She has headaches, yellow and colors, pulsating pounding, throbbing, light sensitivity and nausea, they can be moderate to severe, can have the aura without the headache, migraines can last up to 48 hours. Nothing helps. Worsened. Motion is a trigger. Daily headaches. No medication overuse. > 15 migraine days a month. No other focal neurologic deficits, associated symptoms, inciting events or modifiable factors.  Reviewed notes, labs and imaging from outside physicians, which showed:   CT soft tissue neck 10/25/2021: IMPRESSION: No neck mass or adenopathy.  09/2015: MRI brain: IMPRESSION:  This MRI of the brain without contrast shows the following: 1.   Brain volume appears normal. 2.   There is a single T2/FLAIR hyperintense focus in the subcortical white matter of the right parietal lobe consistent with very minimal age-appropriate chronic microvascular ischemic change. 3.   Incidental note is made of a cavum septum pellucidum, a normal variant. 4.   Mild chronic inflammatory changes in the maxillary sinuses, some  of the ethmoid air cells and the right hemi-sphenoid. 5.   There are no acute findings    .  From a thorough review of records, medications tried that can be used in headache or migraine management include Tylenol, aspirin, metoprolol, Zofran, lyrica, gabapentin, nortriptyline(memory problems), Phenergan, amitriptyline and nortriptyline are contraindicated due to age and risk of falls and  prior memory complaints, topiramate is also contraindicated due to patient's reported prior memory problems and cognitive decline, also tried meclizine. Triptans contraindicated due to heart disease and coronary artery disease. Aimovig contraindicated due to constipation. Ajovy helped 50%. Will try emgality and then go to [facility]. She has had problems with contipation, "serious problems" per patient so ocntaindicated is aimovig and even qulipta.   Recent Results (from the past 2160 hours)  ECHOCARDIOGRAM COMPLETE     Status: None   Collection Time: 04/23/23  8:54 AM  Result Value Ref Range   S' Lateral 2.25 cm   AV Area VTI 2.78 cm2   AV Mean grad 4.0 mmHg   AV Area mean vel 2.71 cm2   Area-P 1/2 3.53 cm2   AR max vel 2.71 cm2   AV Peak grad 8.6 mmHg   Ao pk vel 1.47 m/s   MV M vel 2.25 m/s   MV Peak grad 20.3 mmHg   Est EF 60 - 65%   CBG monitoring, ED     Status: Abnormal   Collection Time: 04/27/23 11:46 AM  Result Value Ref Range   Glucose-Capillary 110 (H) 70 - 99 mg/dL    Comment: Glucose reference range applies only to samples taken after fasting for at least 8 hours.  Protime-INR     Status: None   Collection Time: 04/27/23 11:49 AM  Result Value Ref Range   Prothrombin Time 12.5 11.4 - 15.2 seconds   INR 0.9 0.8 - 1.2    Comment: (NOTE) INR goal varies based on device and disease states. Performed at [facility], [address], [place], [place] [address]   APTT     Status: None   Collection Time: 04/27/23 11:49 AM  Result Value Ref Range   aPTT 24 24 - 36 seconds    Comment: Performed at [facility], [address], [place], [place] [address]  CBC     Status: Abnormal   Collection Time: 04/27/23 11:49 AM  Result Value Ref Range   WBC 9.1 4.0 - 10.5 K/uL   RBC 4.41 3.87 - 5.11 MIL/uL   Hemoglobin 15.0 12.0 - 15.0 g/dL   HCT 63.8 75.6 - 43.3 %   MCV 101.8 (H) 80.0 - 100.0 fL   MCH 34.0 26.0 - 34.0 pg   MCHC 33.4 30.0 - 36.0 g/dL   RDW 29.5 18.8 - 41.6 %    Platelets 230 150 - 400 K/uL   nRBC 0.0 0.0 - 0.2 %    Comment: Performed at [facility], [address], [place], [place] [address]  Differential     Status: None   Collection Time: 04/27/23 11:49 AM  Result Value Ref Range   Neutrophils Relative % 49 %   Neutro Abs 4.6 1.7 - 7.7 K/uL   Lymphocytes Relative 41 %   Lymphs Abs 3.7 0.7 - 4.0 K/uL   Monocytes Relative 8 %   Monocytes Absolute 0.7 0.1 - 1.0 K/uL   Eosinophils Relative 1 %   Eosinophils Absolute 0.1 0.0 - 0.5 K/uL   Basophils Relative 0 %   Basophils Absolute 0.0 0.0 -  0.1 K/uL   Immature Granulocytes 1 %   Abs Immature Granulocytes 0.05 0.00 - 0.07 K/uL    Comment: Performed at [facility], [address], [place], [place] [address]  Comprehensive metabolic panel     Status: Abnormal   Collection Time: 04/27/23 11:49 AM  Result Value Ref Range   Sodium 140 135 - 145 mmol/L   Potassium 3.5 3.5 - 5.1 mmol/L   Chloride 107 98 - 111 mmol/L   CO2 18 (L) 22 - 32 mmol/L   Glucose, Bld 129 (H) 70 - 99 mg/dL    Comment: Glucose reference range applies only to samples taken after fasting for at least 8 hours.   BUN 18 8 - 23 mg/dL   Creatinine, Ser 1.02 0.44 - 1.00 mg/dL   Calcium 9.2 8.9 - 72.5 mg/dL   Total Protein 7.4 6.5 - 8.1 g/dL   Albumin 4.0 3.5 - 5.0 g/dL   AST 30 15 - 41 U/L   ALT 25 0 - 44 U/L   Alkaline Phosphatase 64 38 - 126 U/L   Total Bilirubin 0.8 <1.2 mg/dL   GFR, Estimated >36 >64 mL/min    Comment: (NOTE) Calculated using the CKD-EPI Creatinine Equation (2021)    Anion gap 15 5 - 15    Comment: Performed at [facility], [address] [place], [place] [address]  I-stat chem 8, ED     Status: Abnormal   Collection Time: 04/27/23 11:54 AM  Result Value Ref Range   Sodium 141 135 - 145 mmol/L   Potassium 3.5 3.5 - 5.1 mmol/L   Chloride 108 98 - 111 mmol/L   BUN 18 8 - 23 mg/dL   Creatinine, Ser 4.25 0.44 - 1.00 mg/dL   Glucose, Bld 956 (H) 70 - 99 mg/dL    Comment: Glucose reference range  applies only to samples taken after fasting for at least 8 hours.   Calcium, Ion 1.07 (L) 1.15 - 1.40 mmol/L   TCO2 18 (L) 22 - 32 mmol/L   Hemoglobin 15.6 (H) 12.0 - 15.0 g/dL   HCT 38.7 56.4 - 33.2 %  Urine rapid drug screen (hosp performed)     Status: None   Collection Time: 04/27/23 11:55 AM  Result Value Ref Range   Opiates NONE DETECTED NONE DETECTED   Cocaine NONE DETECTED NONE DETECTED   Benzodiazepines NONE DETECTED NONE DETECTED   Amphetamines NONE DETECTED NONE DETECTED   Tetrahydrocannabinol NONE DETECTED NONE DETECTED   Barbiturates NONE DETECTED NONE DETECTED    Comment: (NOTE) DRUG SCREEN FOR MEDICAL PURPOSES ONLY.  IF CONFIRMATION IS NEEDED FOR ANY PURPOSE, NOTIFY LAB WITHIN 5 DAYS.  LOWEST DETECTABLE LIMITS FOR URINE DRUG SCREEN Drug Class                     Cutoff (ng/mL) Amphetamine and metabolites    1000 Barbiturate and metabolites    200 Benzodiazepine                 200 Opiates and metabolites        300 Cocaine and metabolites        300 THC                            50 Performed at [facility], [address], [place], [place] [address]   Urinalysis, Routine w reflex microscopic -Urine, Clean Catch     Status: Abnormal   Collection  Time: 04/27/23 11:55 AM  Result Value Ref Range   Color, Urine YELLOW YELLOW   APPearance CLEAR CLEAR   Specific Gravity, Urine >1.046 (H) 1.005 - 1.030   pH 6.0 5.0 - 8.0   Glucose, UA NEGATIVE NEGATIVE mg/dL   Hgb urine dipstick NEGATIVE NEGATIVE   Bilirubin Urine NEGATIVE NEGATIVE   Ketones, ur 20 (A) NEGATIVE mg/dL   Protein, ur NEGATIVE NEGATIVE mg/dL   Nitrite NEGATIVE NEGATIVE   Leukocytes,Ua NEGATIVE NEGATIVE    Comment: Performed at [facility], [address], [place], [place] [address]     Patient complains of symptoms per HPI as well as the following symptoms: migraines . Pertinent negatives and positives per HPI. All others negative    Interval history 01/07/2017: She feels better as far as her  memory. She is trying to lose weight. She feels she may have OCD, she will re-read paragraphs over and over, she re-writes lists. It is more "in my head" and not compulsions like repetitive motions. She rechecks things at school. It bothers her, re-reading and double checking everything. It is putting internal stress in her. Discussed  OCD, reviewed DSM criteria.    General history 01/08/2016: [name] returns today to discuss findings from Drs. ZElson is neurocognitive testing. Discussed testing in detail, diagnosis did not include degenerative neurocognitive disorder. Discussion about dementia, her family history of early-onset Alzheimer's, patient was quite relieved that she didn't have dementia although she was a little deflated to hear that menopause and stress potentially has something to do with her cognitive complaints.   HPI:  Helen Jackson is a 63 y.o. female here as a referral from Helen. Link Jackson for memory problems. She has a past medical history of mitral valve prolapse, sensorineural hearing loss, dry eyes, fatty liver, rosacea, actinic keratosis.  She has pernicious anemia. She gets B12 shots and has not been as compliant as she should be. In the last 6-8 months something is not right. She was very sick in her 48s and had a complete workup at [facility] and got better (she had eye movements problems, fatigue, pain,memory problems,difficulty swallowing and eventually diagnosed with severe Vitamin D Deficiency). But this is different. She is starting to forget what time it it, she can't keep up with time. She started misplacing things 6-8 months ago, missing bills, needs to make lists now, she needs more post-it notes, cognitive changes progressive over the last 6 months. 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 She is at a new school and is doing great but knows she was better. She did very well on the MMSE today but she feels like she hesitated and wasn't as quickly she  should've been. The symptoms have progressed over the last 6-8 months, difficulty expressing herslef, word-finding difficulty, forgetting things like tasks she has to do. She is forgetting things people have asked her, forgetting conversations. She is repeating herself at home. No accidents at home or in the car. History of early-onset Alzheimers in the family in the 48s. Mother and grandmother. Denies confusion. Denies hallucination or delusions, no behavioral changes. She is frustrated with her weight. But she denies stress or depression or any mood changes that may be significantly affecting her perceived memory. She has upper airway respirator disease, not OSA, she had a sleep study in the past.  She has occ headaches not significant. No significant alcohol use. Never smoked.    Reviewed notes, labs and imaging from outside physicians, which  showed: Patient was evaluated at [facility] in 2014 through 2016 for: for low back pain and left foot pain and paresthesias in the foot as far as focal weakness. Evaluation showed that radiculopathy was less likely given absence of any nerve impingement on MRI. She was referred to nerve conduction EMG regarding these symptoms also thought possible piriformis syndrome. EMG was unremarkable and she was given exercises to perform at home and a left piriformis muscle injection was suggested. 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 For her mild proptosis TSH she was checked in 2015 was normal and she was followed by ophthalmology. She also complained of fluctuating ptosis which she reported that the be bad for several weeks at a time usually worse in the a.m., no diplopia but had in the past, reported previous problems with dysphasia, single fiber EMG to eval for myasthenia  was negative.   Reviewed EMG nerve conduction study performed 12/12/2014: Patient was complaining of chronic, intermittent history of difficulty eye opening in the morning and with bright lights. She exhibited no fatigable ptosis or disconjugate extraocular movements. Intermittent forced eye closure was noted at several points during testing. Single fiber EMG was negative for possible ocular myasthenia gravis.   Apr 23 2015 [facility]:    TSH 1.640 CBC unremarkable HgbA1c 5.7 CMP with slightly elevated AST and ALT 78 and 85 Creatinine 0.62   Last B12 05/2013 368   MRI of the lumbar spine without contrast December 2014: Unremarkable except for minimal disc bulge without spinal canal or foraminal stenosis at L2-L3. Other than that no significant focal abnormality or any other level.   EMG nerve conduction study in the lower extremities for paresthesias February 2015 was unremarkable.     Review of Systems: Patient complains of symptoms per HPI as well as the following symptoms: Weight gain, fatigue, blurred vision, anemia, chest pain, palpitations, swelling in legs, shortness of breath, snoring, feeling cold, increased thirst, snoring and hearing loss, trouble swallowing, joint pain, cramps, aching muscles, allergies, skin sensitivity, memory loss, numbness, weakness, difficulty swallowing, tremor, snoring.. Pertinent negatives per HPI. All others negative.      Social History   Socioeconomic History   Marital status: Married    Spouse name: Molly Maduro    Number of children: 1   Years of education: 16   Highest education level: Not on file  Occupational History   Occupation: [employer]   Tobacco Use   Smoking status: Never   Smokeless tobacco: Never  Vaping Use   Vaping status: Never Used  Substance and Sexual Activity   Alcohol use: No    Alcohol/week: 0.0 standard drinks of alcohol   Drug use: No   Sexual activity: Yes    Partners: Male    Birth  control/protection: Post-menopausal  Other Topics Concern   Not on file  Social History Narrative   Lives with spouse and son   Caffeine use: 1 cup/day coffee   Social Drivers of Corporate investment banker Strain: Not on file  Food Insecurity: Not on file  Transportation Needs: Not on file  Physical Activity: Not on file  Stress: Not on file  Social Connections: Not on file  Intimate Partner Violence: Not on file    Family History  Problem Relation Age of Onset   Osteoporosis Mother  CVA Mother    Dementia Mother    Stroke Mother    Cancer Father        pancreatic   Breast cancer Paternal Aunt    Chorea Maternal Grandmother     Past Medical History:  Diagnosis Date   Arthritis    Celiac disease    Colitis    Complication of anesthesia    difficulty waking up   Diverticulosis    Elevated alkaline phosphatase level    Fatty liver    Fibroid    Heart murmur    Helen. Kelly,cardiolgy follows   Lichen planus    MVP (mitral valve prolapse)    Ocular rosacea    Peripheral neuropathy    Pernicious anemia    injections every 4 to 6 weeks -last injestion 09-15-14   Seizures (HCC)    see notes in anesthesia   Upper airway resistance syndrome    After sleep study   Upper respiratory disease    upper respiratory airway disease    Past Surgical History:  Procedure Laterality Date   ANKLE SURGERY     CESAREAN SECTION     COLONOSCOPY WITH PROPOFOL N/A 11/28/2014   Procedure: COLONOSCOPY WITH PROPOFOL;  Surgeon: Charna Elizabeth, MD;  Location: [facility];  Service: Endoscopy;  Laterality: N/A;   ESOPHAGOGASTRODUODENOSCOPY (EGD) WITH PROPOFOL N/A 11/28/2014   Procedure: ESOPHAGOGASTRODUODENOSCOPY (EGD) WITH PROPOFOL;  Surgeon: Charna Elizabeth, MD;  Location: [facility];  Service: Endoscopy;  Laterality: N/A;   INCISION AND DRAINAGE     bartholin cyst   KNEE SURGERY     bilateral   MOHS SURGERY Left    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 Swallow whole. 90 tablet 3   Cholecalciferol (VITAMIN D3) 1.25 MG (50000 UT) CAPS Take 50,000 Units by mouth every Sunday.      Cyanocobalamin (B-12) 1000 MCG/ML KIT Inject 1,000 mcg as directed See admin instructions. Every 4-6 weeks     cyanocobalamin (VITAMIN B12) 1000 MCG/ML injection Inject into the muscle.     EPIPEN 2-PAK 0.3 MG/0.3ML SOAJ injection Inject 0.3 mg as directed as needed (for allergies/never used). Reported on 08/30/2015     Evolocumab ([name] [place]) Inject into the skin.     Fremanezumab-vfrm (AJOVY) 225 MG/1.5ML SOAJ Inject 225 mg into the skin every 30 (thirty) days. Copay Card BIN#: [id number] PCN#: [id number] GROUP#: [id number] ID#: [id number] EXPIRES: 06/16/2023 1.5 mL 11   Galcanezumab-gnlm (EMGALITY) 120 MG/ML SOAJ Inject 120 mg into the skin every 30 (thirty) days. 2 mL 0   metoprolol succinate (TOPROL-XL) 25 MG 24 hr tablet TAKE 1 TABLET BY MOUTH EVERY DAY WITH OR IMMEDIATELY FOLLOWING A MEAL 90 tablet 1   metroNIDAZOLE (METROGEL) 1 % gel Apply 1 application topically at bedtime as needed (irritation).      [name] SURECLICK 140 MG/ML SOAJ Inject into the skin.     Rimegepant Sulfate (NURTEC) 75 MG TBDP Take 1 tablet (75 mg total) by mouth daily as needed. For migraines. Take as close to onset of migraine as possible. One daily maximum.  10 tablet 6   WEGOVY 0.25 MG/0.5ML SOAJ Inject into the skin. (Patient not taking: Reported on 07/06/2023)     Current Facility-Administered Medications  Medication Dose Route Frequency Provider Last Rate Last Admin   Fremanezumab-vfrm SOSY 225 mg  225 mg Subcutaneous Once  Anson Fret, MD       Galcanezumab-gnlm SOAJ 240 mg  240 mg Subcutaneous Once         Allergies as of 07/06/2023 - Review Complete 07/06/2023  Allergen Reaction Noted   Gentamicin Anaphylaxis 03/31/2013   Influenza vaccines Anaphylaxis 07/05/2014   Other Anaphylaxis 07/05/2014   Almond oil  05/25/2013   Amoxicillin Hives 02/21/2014   Doxycycline  02/21/2014   Egg-derived products Nausea And Vomiting 05/25/2013   Hydrocodone-acetaminophen Nausea And Vomiting 12/04/2017   Ibuprofen Itching 08/30/2015   Indomethacin Other (See Comments) 03/31/2013   Morphine Nausea And Vomiting and Other (See Comments) 12/04/2017   Pyridium [phenazopyridine hcl]  03/31/2013   Soybean-containing drug products Other (See Comments) 05/25/2013   Soybeans [soybean oil]  05/09/2019   Tetracyclines & related Hives 02/21/2014   Tramadol Itching 02/21/2014   Wheat  05/25/2013   Erythromycin base Rash 08/30/2015    Vitals: BP 128/80 (BP Location: Left Arm, Patient Position: Sitting, Cuff Size: Large)   Pulse 70   Ht 5' 3.75" (1.619 m)   Wt 202 lb (91.6 kg)   LMP 07/17/2012   BMI 34.95 kg/m  Last Weight:  Wt Readings from Last 1 Encounters:  07/06/23 202 lb (91.6 kg)   Last Height:   Ht Readings from Last 1 Encounters:  07/06/23 5' 3.75" (1.619 m)  Exam: NAD, pleasant                  Speech:    Speech is normal; fluent and spontaneous with normal comprehension.  Cognition:    The patient is oriented to person, place, and time;     recent and remote memory intact;     language fluent;    Cranial Nerves:    The pupils are equal, round, and reactive to light.Trigeminal sensation is intact and the muscles of mastication are normal. The face is symmetric. The palate elevates in the midline. Hearing intact. Voice is normal. Shoulder shrug is normal. The tongue has normal motion without fasciculations.   Coordination:  No dysmetria  Motor Observation:    No asymmetry, no atrophy, and no  involuntary movements noted. Tone:    Normal muscle tone.     Strength:    Strength is V/V in the upper and lower limbs.      Sensation: intact to LT    Assessment/Plan:  Helen Jackson is a 63 y.o. female here as requested by Helen Pies, MD for vestibular migraines.has Mitral valve prolapse; Pernicious anemia; Obesity, Class II, BMI 35-39.9, with comorbidity; Vitamin D insufficiency; Obstructive sleep apnea(resolved); Palpitations; Family history of heart disease; Blepharitis with rosacea; UARS (upper airway resistance syndrome); Word finding difficulty; Cognitive change; Chalazion of right upper eyelid; Insulin resistance; Low back pain; Myopic astigmatism of both eyes; Nonalcoholic fatty liver disease; Cervical spondylosis with radiculopathy(s/p ACDF); Chronic pain of left ankle; and Posterior tibial tendinitis, heart murmur, chronic anal fissure and noted proctitis  on their problem list.  Ajovy not helping.  Discussed options in detail Stop Ajovy. Would try Emgality and then go to vyepti. Give loading dose today and 2 samples for her to try and if effective can get it approved or then go to [facility].  She does not feel she would like Botox  Ubrelvy: try samples: Please take one tablet at the onset of your headache. If it does not improve the symptoms please take one additional tablet in 2 hours. Do not take more then 2 tablets in 24hrs.   Meds ordered this encounter  Medications   Galcanezumab-gnlm SOAJ 240 mg   Galcanezumab-gnlm (EMGALITY) 120 MG/ML SOAJ    Sig: Inject 120 mg into the skin every 30 (thirty) days.    Dispense:  2 mL    Refill:  0   .  From a thorough review of records, medications tried that can be used in headache or migraine management include Tylenol, aspirin, metoprolol, Zofran, lyrica, gabapentin, nortriptyline(memory problems), Phenergan, amitriptyline and nortriptyline are contraindicated due to age and risk of falls and prior memory complaints, topiramate is  also contraindicated due to patient's reported prior memory problems and cognitive decline, also tried meclizine. Triptans contraindicated due to heart disease and coronary artery disease. Aimovig contraindicated due to constipation. Ajovy helped 50%. Will try emgality and then go to [facility]. She has had problems with contipation, "serious problems" per patient so ocntaindicated is aimovig and qulipta.   Bernita Raisin: try samples: Please take one tablet at the onset of your headache. If it does not improve the symptoms please take one additional tablet in 2 hours. Do not take more then 2 tablets in 24hrs.   monthly B12 injections for years since college, She manages it with Helen. Link Jackson, encouraged her to always keep it above 400 and check MMA.  No orders of the defined types were placed in this encounter.  Meds ordered this encounter  Medications   Galcanezumab-gnlm SOAJ 240 mg   Galcanezumab-gnlm (EMGALITY) 120 MG/ML SOAJ    Sig: Inject 120 mg into the skin every 30 (thirty) days.    Dispense:  2 mL    Refill:  0     Naomie Dean, MD   [facility] [address] [place], [place] [address]   Phone [phone] Fax [phone]

## 2023-07-06 NOTE — Patient Instructions (Addendum)
Stop Ajovy. Would try Emgality and then go to vyepti. Give loading dose today and 2 samples for her to try and if effective can get it approved or then go to [facility]  She does not feel she would like Botox  Ubrelvy: try samples: Please take one tablet at the onset of your headache. If it does not improve the symptoms please take one additional tablet in 2 hours. Do not take more then 2 tablets in 24hrs.    Eptinezumab Injection What is this medication? EPTINEZUMAB (EP ti NEZ ue mab) prevents migraines. It works by blocking a substance in the body that causes migraines. It is a monoclonal antibody. This medicine may be used for other purposes; ask your health care provider or pharmacist if you have questions. COMMON BRAND NAME(S): Vyepti What should I tell my care team before I take this medication? They need to know if you have any of these conditions: An unusual or allergic reaction to eptinezumab, other medications, foods, dyes, or preservatives Pregnant or trying to get pregnant Breast-feeding How should I use this medication? This medication is injected into a vein. It is given by your care team in a hospital or clinic setting. Talk to your care team about the use of this medication in children. Special care may be needed. Overdosage: If you think you have taken too much of this medicine contact a poison control center or emergency room at once. NOTE: This medicine is only for you. Do not share this medicine with others. What if I miss a dose? Keep appointments for follow-up doses. It is important not to miss your dose. Call your care team if you are unable to keep an appointment. What may interact with this medication? Interactions are not expected. This list may not describe all possible interactions. Give your health care provider a list of all the medicines, herbs, non-prescription drugs, or dietary supplements you use. Also tell them if you smoke, drink alcohol, or use illegal  drugs. Some items may interact with your medicine. What should I watch for while using this medication? Your condition will be monitored carefully while you are receiving this medication. What side effects may I notice from receiving this medication? Side effects that you should report to your care team as soon as possible: Allergic reactions or angioedema--skin rash, itching or hives, swelling of the face, eyes, lips, tongue, arms, or legs, trouble swallowing or breathing Side effects that usually do not require medical attention (report to your care team if they continue or are bothersome): Runny or stuffy nose This list may not describe all possible side effects. Call your doctor for medical advice about side effects. You may report side effects to FDA at 1-800-FDA-1088. Where should I keep my medication? This medication is given in a hospital or clinic. It will not be stored at home. NOTE: This sheet is a summary. It may not cover all possible information. If you have questions about this medicine, talk to your doctor, pharmacist, or health care provider.  2024 Elsevier/Gold Standard (2021-07-29 00:00:00)Ubrogepant Tablets What is this medication? UBROGEPANT (ue BROE je pant) treats migraines. It works by blocking a substance in the body that causes migraines. It is not used to prevent migraines. This medicine may be used for other purposes; ask your health care provider or pharmacist if you have questions. COMMON BRAND NAME(S): Bernita Raisin What should I tell my care team before I take this medication? They need to know if you have any of these conditions:  Kidney disease Liver disease An unusual or allergic reaction to ubrogepant, other medications, foods, dyes, or preservatives Pregnant or trying to get pregnant Breast-feeding How should I use this medication? Take this medication by mouth with a glass of water. Take it as directed on the prescription label. You can take it with or without  food. If it upsets your stomach, take it with food. Keep taking it unless your care team tells you to stop. Talk to your care team about the use of this medication in children. Special care may be needed. Overdosage: If you think you have taken too much of this medicine contact a poison control center or emergency room at once. NOTE: This medicine is only for you. Do not share this medicine with others. What if I miss a dose? This does not apply. This medication is not for regular use. What may interact with this medication? Do not take this medication with any of the following: Adagrasib Ceritinib Certain antibiotics, such as chloramphenicol, clarithromycin, telithromycin Certain antivirals for HIV, such as atazanavir, cobicistat, darunavir, delavirdine, fosamprenavir, indinavir, ritonavir Certain medications for fungal infections, such as itraconazole, ketoconazole, posaconazole, voriconazole Conivaptan Grapefruit Idelalisib Mifepristone Nefazodone Ribociclib This medication may also interact with the following: Carvedilol Certain medications for seizures, such as phenobarbital, phenytoin Ciprofloxacin Cyclosporine Eltrombopag Fluconazole Fluvoxamine Quinidine Rifampin St. John's wort Verapamil This list may not describe all possible interactions. Give your health care provider a list of all the medicines, herbs, non-prescription drugs, or dietary supplements you use. Also tell them if you smoke, drink alcohol, or use illegal drugs. Some items may interact with your medicine. What should I watch for while using this medication? Visit your care team for regular checks on your progress. Tell your care team if your symptoms do not start to get better or if they get worse. Your mouth may get dry. Chewing sugarless gum or sucking hard candy and drinking plenty of water may help. Contact your care team if the problem does not go away or is severe. What side effects may I notice from  receiving this medication? Side effects that you should report to your care team as soon as possible: Allergic reactions--skin rash, itching, hives, swelling of the face, lips, tongue, or throat Side effects that usually do not require medical attention (report to your care team if they continue or are bothersome): Drowsiness Dry mouth Fatigue Nausea This list may not describe all possible side effects. Call your doctor for medical advice about side effects. You may report side effects to FDA at 1-800-FDA-1088. Where should I keep my medication? Keep out of the reach of children and pets. Store between 15 and 30 degrees C (59 and 86 degrees F). Get rid of any unused medication after the expiration date. To get rid of medications that are no longer needed or have expired: Take the medication to a medication take-back program. Check with your pharmacy or law enforcement to find a location. If you cannot return the medication, check the label or package insert to see if the medication should be thrown out in the garbage or flushed down the toilet. If you are not sure, ask your care team. If it is safe to put it in the trash, pour the medication out of the container. Mix the medication with cat litter, dirt, coffee grounds, or other unwanted substance. Seal the mixture in a bag or container. Put it in the trash. NOTE: This sheet is a summary. It may not cover all possible information.  If you have questions about this medicine, talk to your doctor, pharmacist, or health care provider.  2024 Elsevier/Gold Standard (2021-07-26 00:00:00) Galcanezumab Injection What is this medication? GALCANEZUMAB (gal ka NEZ ue mab) prevents migraines. It works by blocking a substance in the body that causes migraines. It may also be used to treat cluster headaches. It is a monoclonal antibody. This medicine may be used for other purposes; ask your health care provider or pharmacist if you have questions. COMMON BRAND  NAME(S): Emgality What should I tell my care team before I take this medication? They need to know if you have any of these conditions: An unusual or allergic reaction to galcanezumab, other medications, foods, dyes, or preservatives Pregnant or trying to get pregnant Breast-feeding How should I use this medication? This medication is injected under the skin. You will be taught how to prepare and give it. Take it as directed on the prescription label. Keep taking it unless your care team tells you to stop. It is important that you put your used needles and syringes in a special sharps container. Do not put them in a trash can. If you do not have a sharps container, call your pharmacist or care team to get one. Talk to your care team about the use of this medication in children. Special care may be needed. Overdosage: If you think you have taken too much of this medicine contact a poison control center or emergency room at once. NOTE: This medicine is only for you. Do not share this medicine with others. What if I miss a dose? If you miss a dose, take it as soon as you can. If it is almost time for your next dose, take only that dose. Do not take double or extra doses. What may interact with this medication? Interactions are not expected. This list may not describe all possible interactions. Give your health care provider a list of all the medicines, herbs, non-prescription drugs, or dietary supplements you use. Also tell them if you smoke, drink alcohol, or use illegal drugs. Some items may interact with your medicine. What should I watch for while using this medication? Visit your care team for regular checks on your progress. Tell your care team if your symptoms do not start to get better or if they get worse. What side effects may I notice from receiving this medication? Side effects that you should report to your care team as soon as possible: Allergic reactions or angioedema--skin rash,  itching or hives, swelling of the face, eyes, lips, tongue, arms, or legs, trouble swallowing or breathing Side effects that usually do not require medical attention (report to your care team if they continue or are bothersome): Pain, redness, or irritation at injection site This list may not describe all possible side effects. Call your doctor for medical advice about side effects. You may report side effects to FDA at 1-800-FDA-1088. Where should I keep my medication? Keep out of the reach of children and pets. Store in a refrigerator or at room temperature between 20 and 25 degrees C (68 and 77 degrees F). Refrigeration (preferred): Store in the refrigerator. Do not freeze. Keep in the original container until you are ready to take it. Remove the dose from the carton about 30 minutes before it is time for you to use it. If the dose is not used, it may be stored in original container at room temperature for 7 days. Get rid of any unused medication after the expiration date.  Room Temperature: This medication may be stored at room temperature for up to 7 days. Keep it in the original container. Protect from light until time of use. If it is stored at room temperature, get rid of any unused medication after 7 days or after it expires, whichever is first. To get rid of medications that are no longer needed or have expired: Take the medication to a medication take-back program. Check with your pharmacy or law enforcement to find a location. If you cannot return the medication, ask your pharmacist or care team how to get rid of this medication safely. NOTE: This sheet is a summary. It may not cover all possible information. If you have questions about this medicine, talk to your doctor, pharmacist, or health care provider.  2024 Elsevier/Gold Standard (2021-07-29 00:00:00)

## 2023-07-21 ENCOUNTER — Other Ambulatory Visit (HOSPITAL_COMMUNITY): Payer: Self-pay

## 2023-07-21 ENCOUNTER — Telehealth: Payer: Self-pay | Admitting: Pharmacy Technician

## 2023-07-21 ENCOUNTER — Telehealth: Payer: Self-pay | Admitting: Neurology

## 2023-07-21 NOTE — Telephone Encounter (Signed)
Pharmacy Patient Advocate Encounter   Received notification from CoverMyMeds that prior authorization for AJOVY (fremanezumab-vfrm) injection 225MG /1.5ML auto-injectors is required/requested.   Insurance verification completed.   The patient is insured through [facility] .   Per test claim: PA required; PA submitted to above mentioned insurance via CoverMyMeds Key/confirmation #/EOC [id number] Status is pending

## 2023-07-21 NOTE — Telephone Encounter (Signed)
Bonita Quin from [employer] of [employer] is asking for additional clinical information re: the pa for pt's  Fremanezumab-vfrm (AJOVY) 225 MG/1.5ML SOAJ .  RN can call [phone] go with option 3, option 4 option 1 and use  [phone]

## 2023-07-22 NOTE — Telephone Encounter (Signed)
 Received a faxed form requesting me to complete PA Form (Initial)-faxed completed form along with clinicals to [facility] at [phone].Awaiting determination.

## 2023-07-27 ENCOUNTER — Other Ambulatory Visit (HOSPITAL_COMMUNITY): Payer: Self-pay

## 2023-07-27 NOTE — Telephone Encounter (Signed)
 PA not needed. Copay is $24.98

## 2023-07-31 NOTE — Telephone Encounter (Signed)
Pharmacy Patient Advocate Encounter  Received notification from [facility] that Prior Authorization for AJOVY (fremanezumab-vfrm) injection 225MG /1.5ML auto-injectors  has been APPROVED from 07/22/2023 to 10/13/2023   PA #/Case ID/Reference #: [id number]

## 2023-09-25 ENCOUNTER — Ambulatory Visit (INDEPENDENT_AMBULATORY_CARE_PROVIDER_SITE_OTHER): Payer: BC Managed Care – PPO

## 2023-10-06 ENCOUNTER — Other Ambulatory Visit (HOSPITAL_COMMUNITY): Payer: Self-pay

## 2023-10-06 ENCOUNTER — Other Ambulatory Visit: Payer: Self-pay | Admitting: *Deleted

## 2023-10-06 ENCOUNTER — Encounter: Payer: Self-pay | Admitting: Neurology

## 2023-10-06 ENCOUNTER — Telehealth: Payer: Self-pay | Admitting: Pharmacist

## 2023-10-06 MED ORDER — EMGALITY 120 MG/ML ~~LOC~~ SOAJ: 120.0000 mg | SUBCUTANEOUS | 3 refills | Status: DC

## 2023-10-06 NOTE — Telephone Encounter (Signed)
 Pharmacy Patient Advocate Encounter  Insurance verification completed.   The patient is insured through [employer] test claim for Emgality  120MG /ML auto-injectors (migraine) The current 30 day co-pay is, $30.00.  No PA needed at this time.  This test claim was processed through [facility]- copay amounts may vary at other pharmacies due to pharmacy/plan contracts, or as the patient moves through the different stages of their insurance plan.

## 2023-10-19 ENCOUNTER — Other Ambulatory Visit (HOSPITAL_COMMUNITY): Payer: Self-pay

## 2023-10-19 ENCOUNTER — Telehealth: Payer: Self-pay

## 2023-10-19 NOTE — Telephone Encounter (Signed)
 Pharmacy Patient Advocate Encounter   Received notification from Physician's Office that prior authorization for Emgality  is required/requested.   Insurance verification completed.   The patient is insured through [employer] .   Per test claim: The current 30 day co-pay is, $30.00.  No PA needed at this time. This test claim was processed through [facility]- copay amounts may vary at other pharmacies due to pharmacy/plan contracts, or as the patient moves through the different stages of their insurance plan.

## 2023-10-19 NOTE — Telephone Encounter (Signed)
 Good Morning, Can someone please do PA for Pt's Emgality . Thank You

## 2023-10-20 NOTE — Telephone Encounter (Signed)
 Spoke with [facility] pharmacy. Emgality  claim was attempted with pt's [employer] plan but our PA team used the [facility] plan. Pharmacy was able to get a paid claim w/ [employer] and Emgality  will be ready in about 1 hr.

## 2023-11-30 ENCOUNTER — Ambulatory Visit: Admitting: Nurse Practitioner

## 2023-12-03 ENCOUNTER — Ambulatory Visit: Admitting: Nurse Practitioner

## 2023-12-10 ENCOUNTER — Other Ambulatory Visit: Payer: Self-pay | Admitting: Cardiovascular Disease

## 2024-01-04 ENCOUNTER — Telehealth: Payer: Self-pay | Admitting: *Deleted

## 2024-01-04 ENCOUNTER — Encounter: Payer: Self-pay | Admitting: Neurology

## 2024-01-04 ENCOUNTER — Ambulatory Visit (INDEPENDENT_AMBULATORY_CARE_PROVIDER_SITE_OTHER): Payer: No Typology Code available for payment source | Admitting: Neurology

## 2024-01-04 VITALS — BP 120/74 | HR 74 | Ht 63.75 in | Wt 207.0 lb

## 2024-01-04 DIAGNOSIS — H9313 Tinnitus, bilateral: Secondary | ICD-10-CM

## 2024-01-04 DIAGNOSIS — H938X3 Other specified disorders of ear, bilateral: Secondary | ICD-10-CM

## 2024-01-04 DIAGNOSIS — G43711 Chronic migraine without aura, intractable, with status migrainosus: Secondary | ICD-10-CM

## 2024-01-04 DIAGNOSIS — R519 Headache, unspecified: Secondary | ICD-10-CM

## 2024-01-04 DIAGNOSIS — G4719 Other hypersomnia: Secondary | ICD-10-CM | POA: Diagnosis not present

## 2024-01-04 MED ORDER — SODIUM CHLORIDE 0.9 % IV SOLN: 100.0000 mg | INTRAVENOUS | 4 refills | Status: AC

## 2024-01-04 NOTE — Telephone Encounter (Signed)
 Order form for Vyepti 100 mg IV q 3 months completed and is pending Dr Sharion signature, then will give to [facility].

## 2024-01-04 NOTE — Progress Notes (Signed)
 [facility]    Provider:  Dr Ines Referring Provider: Larnell Hamilton, MD Primary Care Physician:  Larnell Hamilton, MD    CC:  vestibular migraines  01/04/2024: Changed to emgality  last visit and unclear if working, already tried ajovy , states the dizziness is not as bad sill with daily headaches. At this time we can try either qulipta or vyepti. Still better but daily headaches. She has something every single day and she has dizziness, almost fell at work, pulling over when driving, it is after she takes the emgality  s worse, she has daily headaches and she has > 8 moderate to severe migraine days a month. She went to ENT and the machine wasn't working, she is still concerned about meniere's disease. She has had imaging in the past MRI of the brain, she has word finding difficulty, she has ear fllness and tinnitus. She sees Dr. Karis. Her dizziness is much better than a year ago. When it hits, it is out of the blue. She feels her eyes are bouncing at night. She had a sleep apnea test and she had upper airway resistance syndrome, may want to retest she is exhausted, morning headaches.MRi of the brain in 04/2023, she decided not to go on wegovy but she has gained weight. She get b12 injections, vitamin d  supplementation. 2015 sleep study showed upper airway resistance not definite of OSA had sinus surgery but still having morning headache more days than not and excessive daytime somnolence. No other focal neurologic deficits, associated symptoms, inciting events or modifiable factors.  MRI brain, reviewed report:  MPRESSION: 1. No acute intracranial abnormality. 2. Mild chronic small vessel ischemic disease.  Patient complains of symptoms per HPI as well as the following symptoms: per hpi . Pertinent negatives and positives per HPI. All others negative    07/06/2023: We started her on Ajovy  and she is moving in the right direction. Still having the dizziness with and without  the migraines. Pain is always in the right eye behind the right eye and went to the ED thinking she was having a stroke or TIA and diagnosed with a complicated migraine vs TIA more likely complicated migraine.  Ajovy  has helped with the frequency but she still feels she has 15 total headache days a month, less dizzy days, >8 total migraine days a month. Nurtec did not help. She has had problems with contipation, serious problems per patient.   Patient complains of symptoms per HPI as well as the following symptoms: none . Pertinent negatives and positives per HPI. All others negative   07/06/2023: Patient here for follow up. At last visit we started c grp medication for her migraines possibly with vestibular symptoms. MRI brain IAC protocol was unremarkable. CTA H&N 04/27/2023 unremarkable.  He presented to the emergency room April 27, 2019 for with multiple neurologic symptoms, including left arm numbness, right arm numbness, facial numbness and weakness, right arm weakness, felt like she had a migraine but without the hurting.  Patient was very anxious.  Code stroke was activated.  She received a migraine cocktail and upon reevaluation was able to comply with a full neurologic exam.  Neurologic exam was normal likely complicated migraine.  She was also advised to follow-up with psychiatry to discuss stress reduction techniques and her anxiety.  MRI brain 04/27/2023: 1. No acute intracranial abnormality. 2. Mild chronic small vessel ischemic disease.  CTA Head and Neck 04/27/2023: IMPRESSION: No large vessel occlusion or significant stenosis in the head or neck.  HPI 11/19/2022:  ANAHLA BEVIS is a 63 y.o. female here as requested by Karis Clunes, MD for vestibular migraines.has Mitral valve prolapse; Pernicious anemia; Obesity, Class II, BMI 35-39.9, with comorbidity; Vitamin D  insufficiency; Obstructive sleep apnea(resolved); Palpitations; Family history of heart disease; Blepharitis with  rosacea; UARS (upper airway resistance syndrome); Word finding difficulty; Cognitive change; Chalazion of right upper eyelid; Insulin  resistance; Low back pain; Myopic astigmatism of both eyes; Nonalcoholic fatty liver disease; Cervical spondylosis with radiculopathy(s/p ACDF); Chronic pain of left ankle; and Posterior tibial tendinitis, heart murmur, chronic anal fissure and noted proctitis  on their problem list.  We have seen her in the past for memory problems, she had a workup including formal memory testing which showed normal cognitive aging.  At that time we ordered an MRI of the brain which was unremarkable.  I reviewed my past medical notes and imaging.  When we saw her in 2017 thyroid  panel was normal, methylmalonic acid was normal, HIV was negative, RPR nonreactive, B12 and folate panel showed vitamin B12 320 (but MMA was normal), folate was normal.  In July 2018 vitamin D  was in the low normal range of 30.6.  She was referred by Dr. Tio however I do not see any notes from him on [facility] Everywhere.  I do see Dr. Daryle last note April 2024 on [facility] Everywhere but I do not see any details.  I do not see any recent brain imaging on [facility] Everywhere and recent labs from Care Everywhere are from 2022. I reviewed his notes: She was previously seen for recurrent dizziness, the patient has also been experiencing recurrent dizziness since fall 2023 often accompanied by bilateral aural pressure, she also has migraine headaches with light and sound sensitivity, in between vertigo episodes the patient also has difficulty with her balance, she recently underwent vestibular neurodiagnostic testing she had an essentially normal vestibular assessment with no central peripheral vestibular dysfunction, findings were suggestive of vestibular migraine with poor balance and function, neurodiagnostic testing showed no significant central or peripheral vestibular dysfunction, she does have a history of 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   Started having headaches as a teenager, father with cluster headaches, she was diagnosed with cluster more like someone punching her and hot scalding rags. They went away and then would come back. In her 3s she went to [employer] and she had low vitamin D  and pernicious anemia, monthly B12 injections for years since college, She manages it with Dr. Larnell, encouraged her to always keep it above 400 and check MMA. She takes 50k units weekly and last vitamin D  was low normal. She had surgery for OSA and no longer needs has OSA. She had an ACDF since I saw her. She is here alone. In the fall she got up and the room was spinning. And then it happened a month later. The first one was the whole day on and off, a month later it was brief and then she started paying more attention she started having more dizziness, no inciting events or new medications, she has been dizzy in the past, in January she was seeing lights prior or with her headaches/migraines and she started thinking about it and realized dizziness with and without the headache. When she bends over she gets woozy and dizzy, moving her head got dizzy either side, always had motion sickness especially riding in a car. She went to [facility] neuro[facility]hab. She tried and could not tolerate vestibular therapy. She  feels her eyes are moving and she lost vision. She has headaches, yellow and colors, pulsating pounding, throbbing, light sensitivity and nausea, they can be moderate to severe, can have the aura without the headache, migraines can last up to 48 hours. Nothing helps. Worsened. Motion is a trigger. Daily headaches. No medication overuse. > 15 migraine days a month. No other focal neurologic deficits, associated symptoms, inciting events or modifiable factors.  Reviewed notes, labs and imaging from outside physicians, which showed:   CT soft tissue neck 10/25/2021:  IMPRESSION: No neck mass or adenopathy.  09/2015: MRI brain: IMPRESSION:  This MRI of the brain without contrast shows the following: 1.   Brain volume appears normal. 2.   There is a single T2/FLAIR hyperintense focus in the subcortical white matter of the right parietal lobe consistent with very minimal age-appropriate chronic microvascular ischemic change. 3.   Incidental note is made of a cavum septum pellucidum, a normal variant. 4.   Mild chronic inflammatory changes in the maxillary sinuses, some of the ethmoid air cells and the right hemi-sphenoid. 5.   There are no acute findings     No results found for this or any previous visit (from the past 2160 hours).    Patient complains of symptoms per HPI as well as the following symptoms: migraines . Pertinent negatives and positives per HPI. All others negative    Interval history 01/07/2017: She feels better as far as her memory. She is trying to lose weight. She feels she may have OCD, she will re-read paragraphs over and over, she re-writes lists. It is more in my head and not compulsions like repetitive motions. She rechecks things at school. It bothers her, re-reading and double checking everything. It is putting internal stress in her. Discussed  OCD, reviewed DSM criteria.    General history 01/08/2016: [name] returns today to discuss findings from Drs. ZElson is neurocognitive testing. Discussed testing in detail, diagnosis did not include degenerative neurocognitive disorder. Discussion about dementia, her family history of early-onset Alzheimer's, patient was quite relieved that she didn't have dementia although she was a little deflated to hear that menopause and stress potentially has something to do with her cognitive complaints.   HPI:  BRITTANE GRUDZINSKI is a 63 y.o. female here as a referral from Dr. Larnell for memory problems. She has a past medical history of mitral valve prolapse, sensorineural hearing loss,  dry eyes, fatty liver, rosacea, actinic keratosis.  She has pernicious anemia. She gets B12 shots and has not been as compliant as she should be. In the last 6-8 months something is not right. She was very sick in her 74s and had a complete workup at [facility] and got better (she had eye movements problems, fatigue, pain,memory problems,difficulty swallowing and eventually diagnosed with severe Vitamin D  Deficiency). But this is different. She is starting to forget what time it it, she can't keep up with time. She started misplacing things 6-8 months ago, missing bills, needs to make lists now, she needs more post-it notes, cognitive changes progressive over the last 6 months. 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 She is at a new school and is doing great but knows she was better. She did very well on the MMSE today but she feels like she hesitated and wasn't as quickly she should've been. The symptoms have progressed over the last 6-8 months, difficulty expressing herslef, word-finding difficulty, forgetting things like tasks she has to  do. She is forgetting things people have asked her, forgetting conversations. She is repeating herself at home. No accidents at home or in the car. History of early-onset Alzheimers in the family in the 51s. Mother and grandmother. Denies confusion. Denies hallucination or delusions, no behavioral changes. She is frustrated with her weight. But she denies stress or depression or any mood changes that may be significantly affecting her perceived memory. She has upper airway respirator disease, not OSA, she had a sleep study in the past.  She has occ headaches not significant. No significant alcohol use. Never smoked.    Reviewed notes, labs and imaging from outside physicians, which showed: Patient was evaluated at [facility] in 2014 through 2016 for: for low back pain and left foot pain and paresthesias in the foot as far as focal weakness. Evaluation showed  that radiculopathy was less likely given absence of any nerve impingement on MRI. She was referred to nerve conduction EMG regarding these symptoms also thought possible piriformis syndrome. EMG was unremarkable and she was given exercises to perform at home and a left piriformis muscle injection was suggested. 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 For her mild proptosis TSH she was checked in 2015 was normal and she was followed by ophthalmology. She also complained of fluctuating ptosis which she reported that the be bad for several weeks at a time usually worse in the a.m., no diplopia but had in the past, reported previous problems with dysphasia, single fiber EMG to eval for myasthenia was negative.   Reviewed EMG nerve conduction study performed 12/12/2014: Patient was complaining of chronic, intermittent history of difficulty eye opening in the morning and with bright lights. She exhibited no fatigable ptosis or disconjugate extraocular movements. Intermittent forced eye closure was noted at several points during testing. Single fiber EMG was negative for possible ocular myasthenia gravis.   Apr 23 2015 [facility]:    TSH 1.640 CBC unremarkable HgbA1c 5.7 CMP with slightly elevated AST and ALT 78 and 85 Creatinine 0.62   Last B12 05/2013 368   MRI of the lumbar spine without contrast December 2014: Unremarkable except for minimal disc bulge without spinal canal or foraminal stenosis at L2-L3. Other than that no significant focal abnormality or any other level.   EMG nerve conduction study in the lower extremities for paresthesias February 2015 was unremarkable.     Review of Systems: Patient complains of symptoms per HPI as well as the following symptoms: Weight gain, fatigue, blurred  vision, anemia, chest pain, palpitations, swelling in legs, shortness of breath, snoring, feeling cold, increased thirst, snoring and hearing loss, trouble swallowing, joint pain, cramps, aching muscles, allergies, skin sensitivity, memory loss, numbness, weakness, difficulty swallowing, tremor, snoring.. Pertinent negatives per HPI. All others negative.      Social History   Socioeconomic History   Marital status: Married    Spouse name: Lamar    Number of children: 1   Years of education: 16   Highest education level: Not on file  Occupational History   Occupation: [employer]   Tobacco Use   Smoking status: Never   Smokeless tobacco: Never  Vaping Use   Vaping status: Never Used  Substance and Sexual Activity   Alcohol use: No    Alcohol/week: 0.0 standard drinks of alcohol  Drug use: No   Sexual activity: Yes    Partners: Male    Birth control/protection: Post-menopausal  Other Topics Concern   Not on file  Social History Narrative   Lives with spouse and son   Caffeine use: 1 cup/day coffee, occasional coca cola   Social Drivers of Corporate investment banker Strain: Not on file  Food Insecurity: Not on file  Transportation Needs: Not on file  Physical Activity: Not on file  Stress: Not on file  Social Connections: Not on file  Intimate Partner Violence: Not on file    Family History  Problem Relation Age of Onset   Osteoporosis Mother    CVA Mother    Dementia Mother    Stroke Mother    Cancer Father        pancreatic   Breast cancer Paternal Aunt    Chorea Maternal Grandmother     Past Medical History:  Diagnosis Date   Arthritis    Celiac disease    Colitis    Complication of anesthesia    difficulty waking up   Diverticulosis    Elevated alkaline phosphatase level    Fatty liver    Fibroid    Heart murmur    Dr. Burnard bijou follows   Hot flashes 2025   Lichen planus    MVP (mitral valve prolapse)    Ocular  rosacea    Peripheral neuropathy    Pernicious anemia    injections every 4 to 6 weeks -last injestion 09-15-14   Seizures (HCC)    see notes in anesthesia   Upper airway resistance syndrome    After sleep study   Upper respiratory disease    upper respiratory airway disease    Past Surgical History:  Procedure Laterality Date   ANKLE SURGERY     CESAREAN SECTION     COLONOSCOPY WITH PROPOFOL  N/A 11/28/2014   Procedure: COLONOSCOPY WITH PROPOFOL ;  Surgeon: Renaye Sous, MD;  Location: [facility];  Service: Endoscopy;  Laterality: N/A;   ESOPHAGOGASTRODUODENOSCOPY (EGD) WITH PROPOFOL  N/A 11/28/2014   Procedure: ESOPHAGOGASTRODUODENOSCOPY (EGD) WITH PROPOFOL ;  Surgeon: Renaye Sous, MD;  Location: [facility];  Service: Endoscopy;  Laterality: N/A;   INCISION AND DRAINAGE     bartholin cyst   KNEE SURGERY     bilateral   MOHS SURGERY Left    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      Cyanocobalamin (B-12) 1000 MCG/ML KIT Inject 1,000 mcg as directed See admin instructions. Every 4-6 weeks     cyanocobalamin (VITAMIN B12) 1000 MCG/ML injection Inject into the muscle.     EPIPEN  2-PAK 0.3 MG/0.3ML SOAJ injection Inject 0.3 mg as directed as needed (for allergies/never used). Reported on 08/30/2015     Evolocumab ([name] Hutchinson) Inject into the skin every 14 (fourteen) days.     metoprolol  succinate (TOPROL -XL) 25 MG 24 hr tablet TAKE 1 TABLET BY MOUTH EVERY DAY WITH OR IMMEDIATELY FOLLOWING A MEAL 90 tablet 0   metroNIDAZOLE (METROGEL) 1 % gel Apply 1 application  topically  at bedtime as needed (irritation).      [name] SURECLICK 140 MG/ML SOAJ Inject into the skin.     sodium chloride  0.9 % SOLN 100 mL with Eptinezumab-jjmr 100 MG/ML SOLN 100 mg Inject 100 mg into the vein every 3 (three) months. 100 mg 4   aspirin EC 81 MG tablet Take 1 tablet (81 mg total) by mouth daily. Swallow whole. (Patient not taking: Reported on 01/04/2024) 90 tablet 3   Rimegepant Sulfate (NURTEC) 75 MG TBDP Take 1 tablet (75 mg total) by mouth daily as needed. For migraines. Take as close to onset of migraine as possible. One daily maximum. (Patient not taking: Reported on 01/04/2024) 10 tablet 6   WEGOVY 0.25 MG/0.5ML SOAJ Inject into the skin. (Patient not taking: Reported on 01/04/2024)     No current facility-administered medications for this visit.    Allergies as of 01/04/2024 - Review Complete 01/04/2024  Allergen Reaction Noted   Gentamicin Anaphylaxis 03/31/2013   Influenza vaccines Anaphylaxis 07/05/2014   Other Anaphylaxis 07/05/2014   Almond oil  05/25/2013   Amoxicillin Hives 02/21/2014   Doxycycline  02/21/2014   Egg-derived products Nausea And Vomiting 05/25/2013   Hydrocodone-acetaminophen  Nausea And Vomiting 12/04/2017   Ibuprofen  Itching 08/30/2015   Indomethacin Other (See Comments) 03/31/2013   Morphine Nausea And Vomiting and Other (See Comments) 12/04/2017   Pyridium [phenazopyridine hcl]  03/31/2013   Soybean-containing drug products Other (See Comments) 05/25/2013   Soybeans [soybean oil]  05/09/2019   Tetracyclines & related Hives 02/21/2014   Tramadol Itching 02/21/2014   Wheat  05/25/2013   Erythromycin base Rash 08/30/2015    Vitals: BP 120/74 (BP Location: Right Arm, Patient Position: Sitting, Cuff Size: Large)   Pulse 74   Ht 5' 3.75 (1.619 m)   Wt 207 lb (93.9 kg)   LMP 07/17/2012   BMI 35.81 kg/m  Last Weight:  Wt Readings from Last 1 Encounters:  01/04/24 207 lb (93.9 kg)   Last Height:   Ht Readings from Last 1  Encounters:  01/04/24 5' 3.75 (1.619 m)    Physical exam: Exam: Gen: NAD, conversant      CV: No palpitations or chest pain or SOB. VS: Breathing at a normal rate. obesity. Not febrile. Eyes: Conjunctivae clear without exudates or hemorrhage  Neuro: Detailed Neurologic Exam  Speech:    Speech is normal; fluent and spontaneous with normal comprehension.  Cognition:    The patient is oriented to person, place, and time;     recent and remote memory intact;     language fluent;     normal attention, concentration, fund of knowledge Cranial Nerves:    The pupils are equal, round, and reactive to light. Visual fields are full Extraocular movements are intact.  The face is symmetric with normal sensation. The palate elevates in the midline. Hearing intact. Voice is normal. Shoulder shrug is normal. The tongue has normal motion without fasciculations.   Coordination: normal  Gait:    No abnormalities noted or reported  Motor Observation:   no involuntary movements noted. Tone:    Appears normal  Posture:    Posture is normal. normal erect    Strength:    Strength is anti-gravity and symmetric in the upper and lower limbs.      Sensation: intact to LT, no reports of numbness or tingling or paresthesias        Assessment/Plan:  CATORI PANOZZO is a 63 y.o. female here as requested by Karis Clunes,  MD for vestibular migraines.has Mitral valve prolapse; Pernicious anemia; Obesity, Class II, BMI 35-39.9, with comorbidity; Vitamin D  insufficiency; Obstructive sleep apnea(resolved); Palpitations; Family history of heart disease; Blepharitis with rosacea; UARS (upper airway resistance syndrome); Word finding difficulty; Cognitive change; Chalazion of right upper eyelid; Insulin  resistance; Low back pain; Myopic astigmatism of both eyes; Nonalcoholic fatty liver disease; Cervical spondylosis with radiculopathy(s/p ACDF); Chronic pain of left ankle; and Posterior tibial tendinitis, heart  murmur, chronic anal fissure and noted proctitis  on their problem list.  Ajovy  not helping, emgality  minimal help. Start vyepti. Next try qulipta also may try lamictal and retry topiramate(memory complaints) Morning headaches, excessively fatigued exhausted , 2015 sleep study showed upper airway resistance not definite of OSA had sinus surgery but still having morning headache more days than not and excessive daytime somnolence. Sleep evaluation: ESS 19, FSS 56 She does not feel she would like Botox See Dr. Karis again to discuss inner-ear pathology/meniere's disease Discussed options in detail   From a thorough review of records, medications tried that can be used in headache or migraine management include Tylenol , aspirin, metoprolol , Zofran , lyrica, gabapentin, nortriptyline(memory problems), Phenergan , tried nortriptyline and amitriptyline  are contraindicated due to age and risk of falls and prior memory complaints, tried topiramate but zonisamide and topiramate is also contraindicated due to patient's reported prior memory problems and cognitive decline, also tried meclizine. Triptans contraindicated due to heart disease and coronary artery disease. Aimovig contraindicated due to constipation. Ajovy  helped minimally, emgality   minimally. She has had problems with contipation, serious problems per patient so aimovig is contraindicatedg, tried ajovy  and emgality , on metoprolol , ubrelvy/nurtec never really helped   monthly B12 injections for years since college, She manages it with Dr. Larnell, encouraged her to always keep it above 400, continue vitamin D  supplementation  Orders Placed This Encounter  Procedures   Ambulatory referral to Sleep Studies   Meds ordered this encounter  Medications   sodium chloride  0.9 % SOLN 100 mL with Eptinezumab-jjmr 100 MG/ML SOLN 100 mg    Sig: Inject 100 mg into the vein every 3 (three) months.    Dispense:  100 mg    Refill:  4     Onetha Epp, MD   [facility] [address] [place], [place] [address]   Phone [phone] Fax [phone] I spent 35 minutes of face-to-face and non-face-to-face time with patient on the  1. Chronic migraine without aura, with intractable migraine, so stated, with status migrainosus   2. Tinnitus of both ears   3. Sensation of fullness in both ears   4. Morning headache   5. Excessive daytime sleepiness    diagnosis.  This included previsit chart review, lab review, study review, order entry, electronic health record documentation, patient education on the different diagnostic and therapeutic options, counseling and coordination of care, risks and benefits of management, compliance, or risk factor reduction

## 2024-01-04 NOTE — Progress Notes (Signed)
 ESS 19, FSS 56.

## 2024-01-04 NOTE — Patient Instructions (Addendum)
 See Dr. Karis again to discuss inner-ear pathology/meniere's disease Morning headaches, excessively fatigued exhausted : sleep referral Start Vyepti Can continue emgality  until you start vyepti(can even overlap a month)  Eptinezumab Injection What is this medication? EPTINEZUMAB (EP ti NEZ ue mab) prevents migraines. It works by blocking a substance in the body that causes migraines. It is a monoclonal antibody. This medicine may be used for other purposes; ask your health care provider or pharmacist if you have questions. COMMON BRAND NAME(S): Vyepti What should I tell my care team before I take this medication? They need to know if you have any of these conditions: An unusual or allergic reaction to eptinezumab, other medications, foods, dyes, or preservatives Pregnant or trying to get pregnant Breast-feeding How should I use this medication? This medication is injected into a vein. It is given by your care team in a hospital or clinic setting. Talk to your care team about the use of this medication in children. Special care may be needed. Overdosage: If you think you have taken too much of this medicine contact a poison control center or emergency room at once. NOTE: This medicine is only for you. Do not share this medicine with others. What if I miss a dose? Keep appointments for follow-up doses. It is important not to miss your dose. Call your care team if you are unable to keep an appointment. What may interact with this medication? Interactions are not expected. This list may not describe all possible interactions. Give your health care provider a list of all the medicines, herbs, non-prescription drugs, or dietary supplements you use. Also tell them if you smoke, drink alcohol, or use illegal drugs. Some items may interact with your medicine. What should I watch for while using this medication? Your condition will be monitored carefully while you are receiving this  medication. What side effects may I notice from receiving this medication? Side effects that you should report to your care team as soon as possible: Allergic reactions or angioedema--skin rash, itching or hives, swelling of the face, eyes, lips, tongue, arms, or legs, trouble swallowing or breathing Side effects that usually do not require medical attention (report to your care team if they continue or are bothersome): Runny or stuffy nose This list may not describe all possible side effects. Call your doctor for medical advice about side effects. You may report side effects to FDA at 1-800-FDA-1088. Where should I keep my medication? This medication is given in a hospital or clinic. It will not be stored at home. NOTE: This sheet is a summary. It may not cover all possible information. If you have questions about this medicine, talk to your doctor, pharmacist, or health care provider.  2024 Elsevier/Gold Standard (2021-07-29 00:00:00)

## 2024-01-07 NOTE — Telephone Encounter (Signed)
 Order signed and then given to intrafusion 01-06-2024.

## 2024-01-19 ENCOUNTER — Other Ambulatory Visit (HOSPITAL_COMMUNITY)
Admission: RE | Admit: 2024-01-19 | Discharge: 2024-01-19 | Disposition: A | Discharge status: Home / Self Care | Source: Ambulatory Visit | Attending: Nurse Practitioner | Admitting: Nurse Practitioner

## 2024-01-19 ENCOUNTER — Ambulatory Visit (INDEPENDENT_AMBULATORY_CARE_PROVIDER_SITE_OTHER): Admitting: Nurse Practitioner

## 2024-01-19 ENCOUNTER — Encounter: Payer: Self-pay | Admitting: Nurse Practitioner

## 2024-01-19 VITALS — BP 120/74 | HR 71 | Ht 62.25 in | Wt 204.0 lb

## 2024-01-19 DIAGNOSIS — Z124 Encounter for screening for malignant neoplasm of cervix: Secondary | ICD-10-CM

## 2024-01-19 DIAGNOSIS — R635 Abnormal weight gain: Secondary | ICD-10-CM | POA: Diagnosis not present

## 2024-01-19 DIAGNOSIS — R232 Flushing: Secondary | ICD-10-CM

## 2024-01-19 DIAGNOSIS — Z01419 Encounter for gynecological examination (general) (routine) without abnormal findings: Secondary | ICD-10-CM

## 2024-01-19 DIAGNOSIS — Z1331 Encounter for screening for depression: Secondary | ICD-10-CM

## 2024-01-19 DIAGNOSIS — M85851 Other specified disorders of bone density and structure, right thigh: Secondary | ICD-10-CM

## 2024-01-19 DIAGNOSIS — Z780 Asymptomatic menopausal state: Secondary | ICD-10-CM | POA: Diagnosis not present

## 2024-01-19 DIAGNOSIS — M85852 Other specified disorders of bone density and structure, left thigh: Secondary | ICD-10-CM

## 2024-01-19 DIAGNOSIS — Z9189 Other specified personal risk factors, not elsewhere classified: Secondary | ICD-10-CM

## 2024-01-19 DIAGNOSIS — Z7689 Persons encountering health services in other specified circumstances: Secondary | ICD-10-CM | POA: Diagnosis not present

## 2024-01-19 NOTE — Progress Notes (Signed)
 Helen Jackson 11-30-1960 [id number]   History:  63 y.o. G1P1001 presents for annual exam. Postmenopausal - no HRT, no bleeding. Normal pap and mammogram history. Sees cardiology for murmur, MVP. Has been struggling with weight gain. Saw nutritionist years back but did not feel it was beneficial. Has gained 35 pounds in 2.5 years. Reports having some hot flashes and night sweats the last 6-8 months. Started new migraine med at that time. Has had TSH and Vit D checked. Asking if she needs hormones checked. Osteopenia managed by PCP.   Gynecologic History Patient's last menstrual period was 07/17/2012.   Contraception/Family planning: post menopausal status Sexually active: Yes  Health Maintenance Last Pap: 01/08/2017. Results were: Normal neg HPV Last mammogram: 2024. Results were: Normal per patient Last colonoscopy: 07/26/2020. 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 Results were: T-score -2.4, FRAX 32% / 3.2% (T-score -1.5 in 2022)     01/19/2024    7:41 AM  Depression screen PHQ 2/9  Decreased Interest 0  Down, Depressed, Hopeless 0  PHQ - 2 Score 0     Past medical history, past surgical history, family history and social history were all reviewed and documented in the EPIC chart. Married. Retired Runner, broadcasting/film/video. Working as Scientist, physiological at [employer].    ROS:  A ROS was performed and pertinent positives and negatives are included.  Exam:  Vitals:   01/19/24 0731  BP: 120/74  Pulse: 71  SpO2: 97%  Weight: 204 lb (92.5 kg)  Height: 5' 2.25 (1.581 m)    Body mass index is 37.01 kg/m.  General appearance:  Normal Thyroid :  Symmetrical, normal in size, without palpable masses or nodularity. Respiratory  Auscultation:  Clear without wheezing or rhonchi Cardiovascular  Auscultation:  Regularly irregular without rubs, murmurs or gallops  Edema/varicosities:  Not grossly evident Abdominal  Soft,nontender, without masses,  guarding or rebound.  Liver/spleen:  No organomegaly noted  Hernia:  None appreciated  Skin  Inspection:  Grossly normal Breasts: Examined lying and sitting.   Right: Without masses, retractions, nipple discharge or axillary adenopathy.   Left: Without masses, retractions, nipple discharge or axillary adenopathy. Pelvic: External genitalia:  no lesions              Urethra:  normal appearing urethra with no masses, tenderness or lesions              Bartholins and Skenes: normal                 Vagina: normal appearing vagina with normal color and discharge, no lesions.Uterine prolapse stage 1              Cervix: no lesions Bimanual Exam:  Uterus:  no masses or tenderness              Adnexa: no mass, fullness, tenderness              Rectovaginal: Deferred              Anus:  normal, no lesions  Patient informed chaperone available to be present for breast and pelvic exam. Patient has requested no chaperone to be present. Patient has been advised what will be completed during breast and pelvic exam.   Assessment/Plan:  63 y.o. G1P1001 for annual exam.   Well female exam with routine gynecological exam - Education provided on SBEs, importance of preventative screenings, current guidelines, high calcium  diet, regular exercise, and multivitamin daily. Labs with PCP.  Postmenopausal - no HRT, no bleeding.  Cervical cancer screening - Plan: Cytology - PAP( [facility]). Normal pap history.   Hot flashes - Not s/t menopause at this point. Likely due to new migraine medicine Emgality  as symptoms began around the time she started this.   Encounter for weight management - Has gained 35 pounds in 2.5 years. 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 Recommend frequent standing and walking throughout day, walking at lunch and/or after work each day for 30 minutes, intermittent fasting. Discussed importance of daily calorie deficit for weight loss.   Osteopenia of both hips - 01/2023  T-score -2.4 with elevated FRAX. Managed by PCP. Taking Vit D. H/O personal fracture and parents with fracture history. Recommend OV to discuss management.   Fracture Risk Assessment Score (FRAX) indicating greater than 20% risk for major osteoporosis-related fracture - 32%  Fracture Risk Assessment Score (FRAX) indicating greater than 3% risk for hip fracture - 3.2%  Screening for breast cancer - Normal mammogram history.  Continue annual screenings.  Normal breast exam today.  Screening for colon cancer - 07/2020 colonoscopy. Will repeat at GI's recommended interval.   Screening for osteoporosis - Normal Dexa in 2020 per patient. PCP manages.   Return in about 1 year (around 01/18/2025) for Annual.    Helen DELENA Shutter DNP, 12:22 PM 01/20/2024

## 2024-01-21 ENCOUNTER — Ambulatory Visit: Payer: Self-pay | Admitting: Nurse Practitioner

## 2024-01-21 ENCOUNTER — Telehealth: Payer: Self-pay

## 2024-01-21 LAB — CYTOLOGY - PAP
Comment: NEGATIVE
Diagnosis: NEGATIVE
High risk HPV: NEGATIVE

## 2024-01-21 NOTE — Telephone Encounter (Addendum)
 Pt called & left message on triage line stating she received a call about a bone density & needing to come into the office for an appt. Pt states she was just here this week & does not want to spend money for another office visit. She is asking if Annabella can just send her a mychart message regarding it instead.  I spoke to Al[place]a. She stated when the patient was her for her aex that we did not have her bmd results due to her pcp managing it for her. The patient had her results sent to us . After review, due to her results, Annabella wants patient to either come to see us  for a consult for management or her pcp due to her fracture risk.I called patient & left a message for her to call us  back.

## 2024-01-22 NOTE — Telephone Encounter (Signed)
 Left message to call [facility] Triage at [phone], option 4.

## 2024-01-29 NOTE — Telephone Encounter (Signed)
 See patient MyChart response dated 01/29/24.   Dr. Daryle will continue managing osteopenia.   Encounter closed.

## 2024-02-03 IMAGING — CT CT NECK W/ CM
4 series · 15 of 33 positions shown, 18 images · IV contrast (agent unspecified)
Comparison: None Available.

CLINICAL DATA: Cervical lymphadenopathy

EXAM:
CT NECK WITH CONTRAST
TECHNIQUE: Multidetector CT imaging of the neck was performed using the
standard protocol following the bolus administration of intravenous
contrast.

[Series 3: neck 2.0 st · axial · 0.41mm/px · z∈[-256,-220]mm · 2 of 107 slices shown (1 of 3)]
[im 18/107  bone]
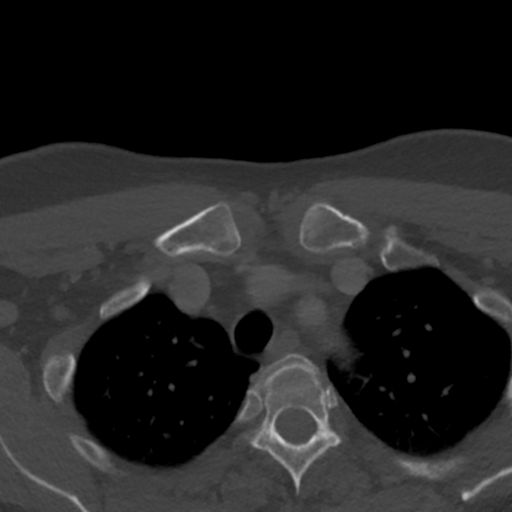
[im 36/107  bone]
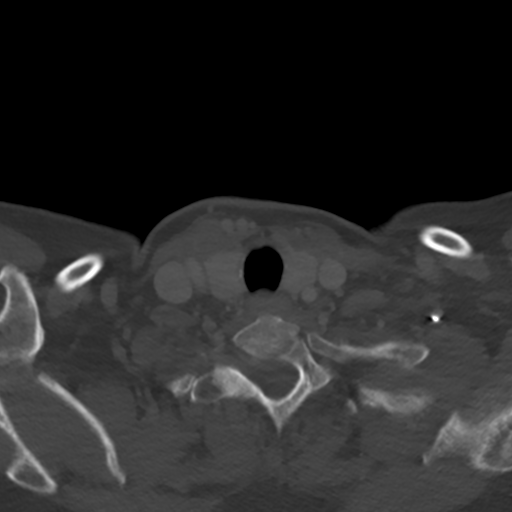

[Series 5: neck 2.0 st · sagittal · 0.45mm/px · 5 of 102 slices shown, 6 images (2 of 3)]
[im 34/102  bone]
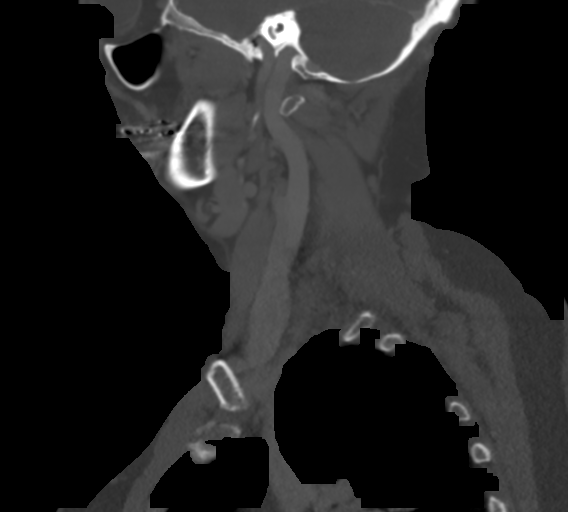
[im 43/102  bone]
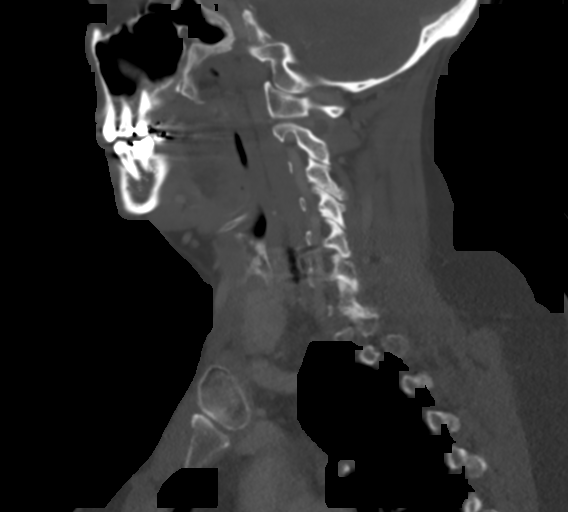
[im 51/102  soft-tissue]
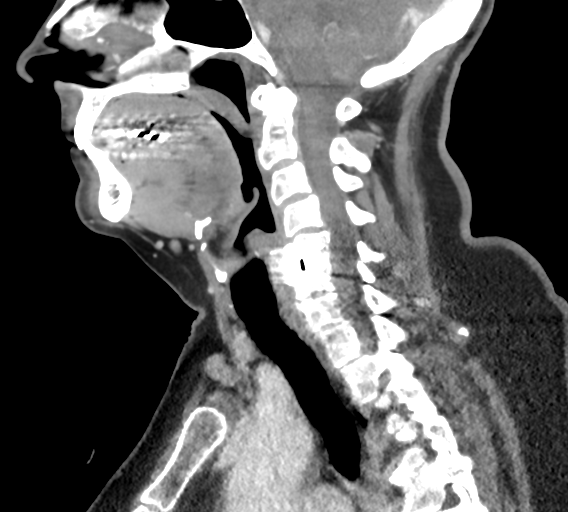
[im 51/102  bone]
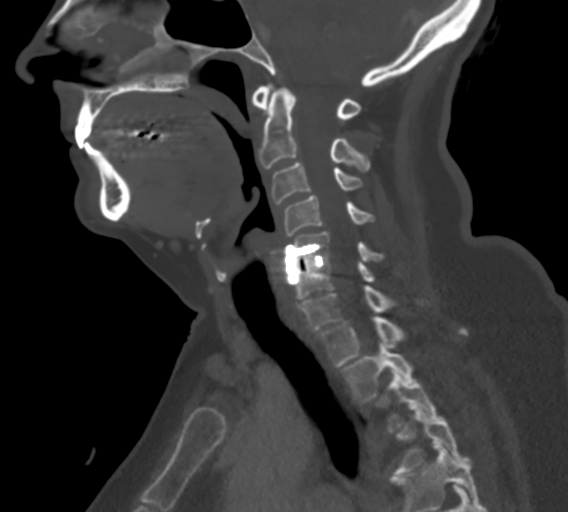
[im 59/102  bone]
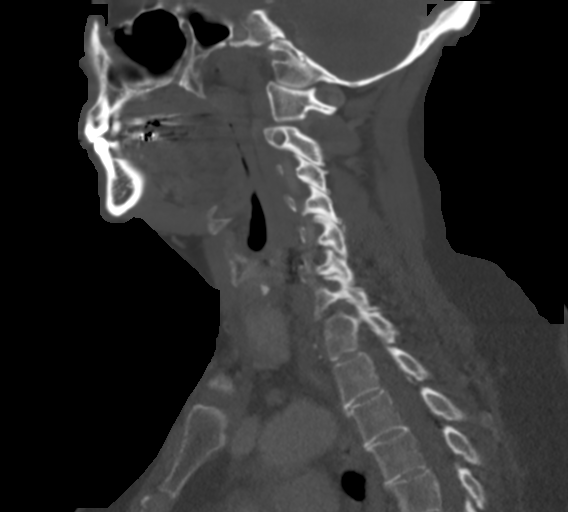
[im 68/102  bone]
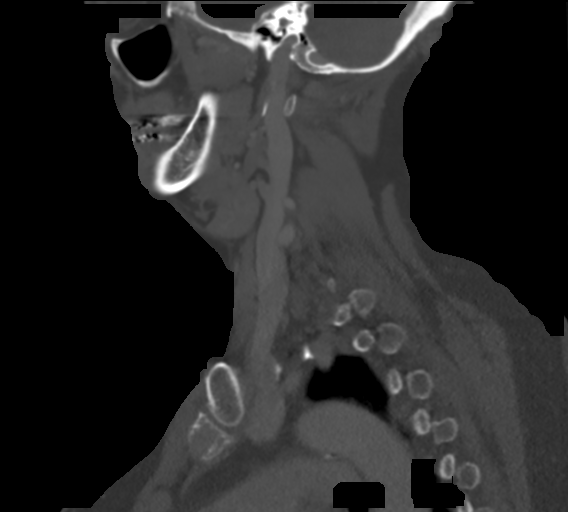

[Series 6: neck 2.0 st · coronal · 0.45mm/px · 3 of 102 slices shown (3 of 3)]
[im 21/102  bone]
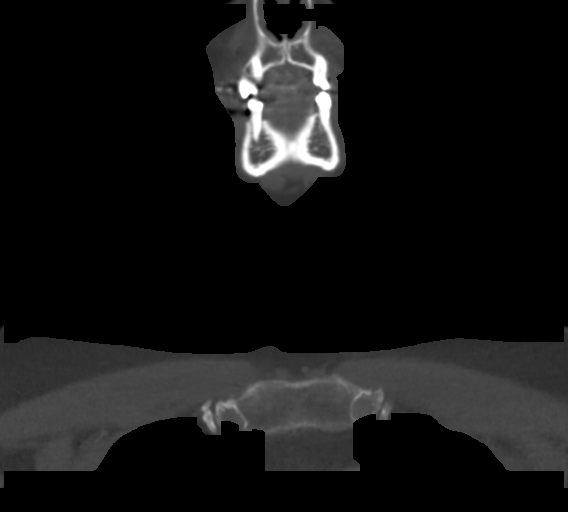
[im 41/102  bone]
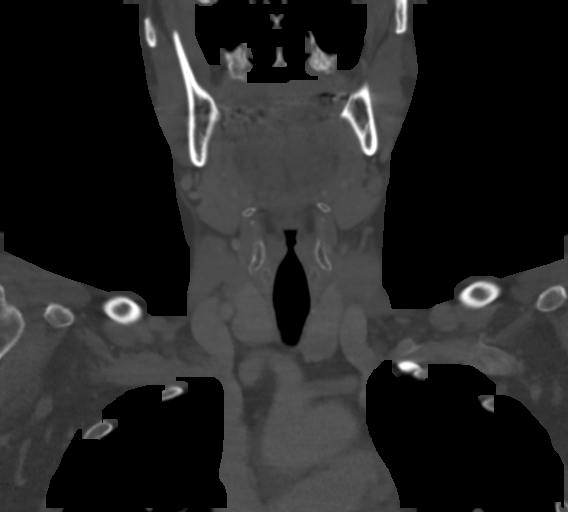
[im 61/102  bone]
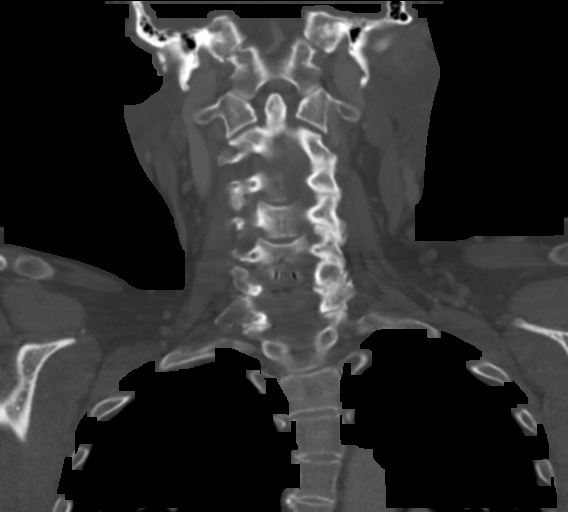

[Series 7: neck 2.0 st orthogonal · axial · 0.39mm/px · z∈[-289,-141]mm · 5 of 114 slices shown, 7 images]
[im 19/114  soft-tissue]
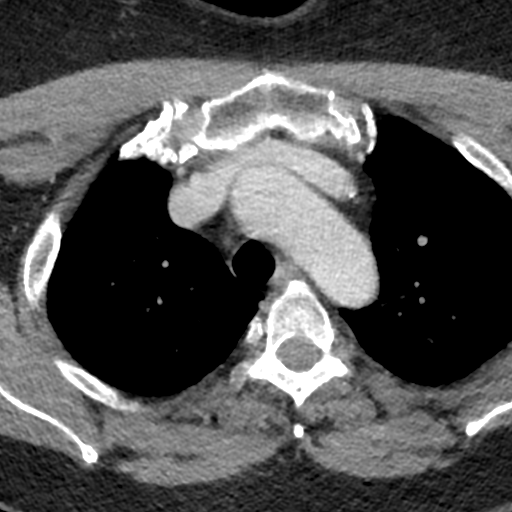
[im 19/114  bone]
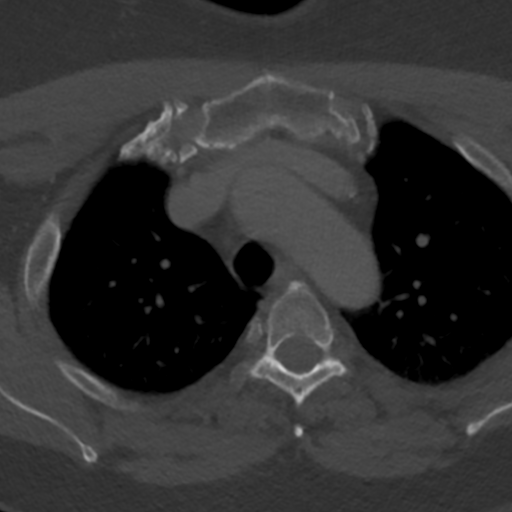
[im 38/114  bone]
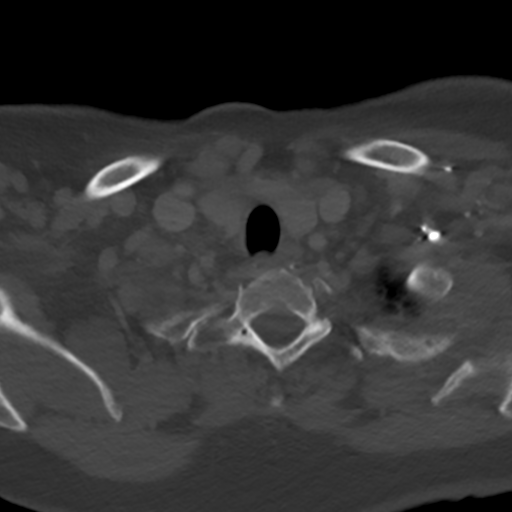
[im 57/114  bone]
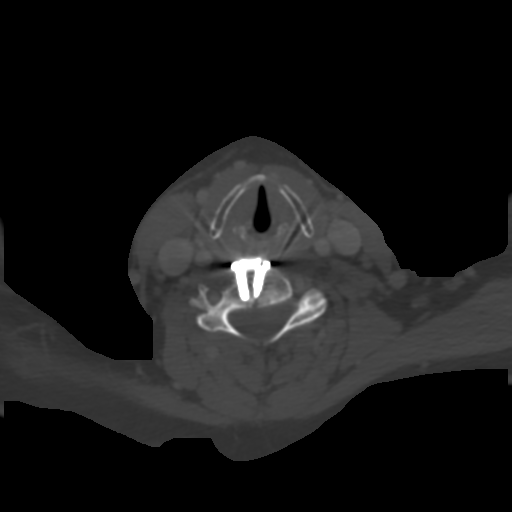
[im 76/114  bone]
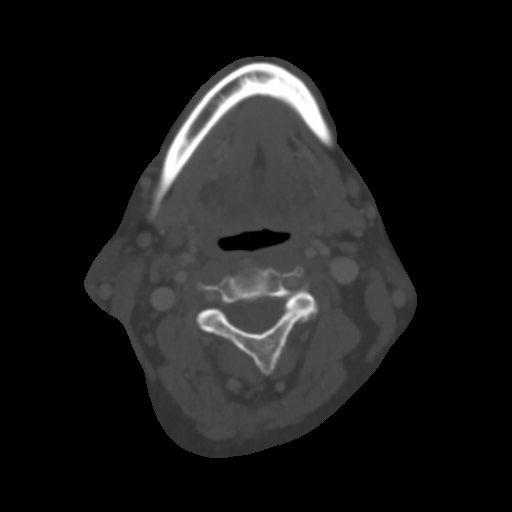
[im 95/114  soft-tissue]
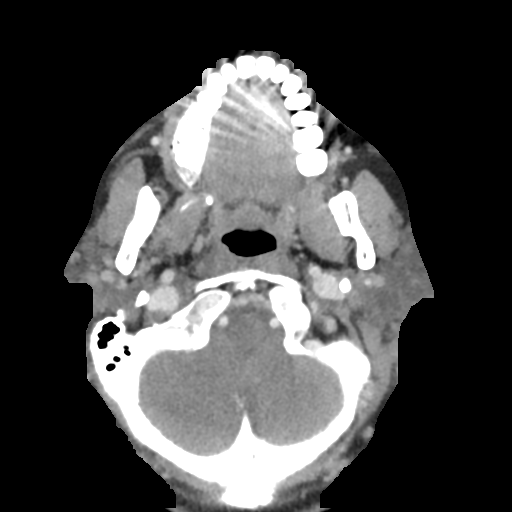
[im 95/114  bone]
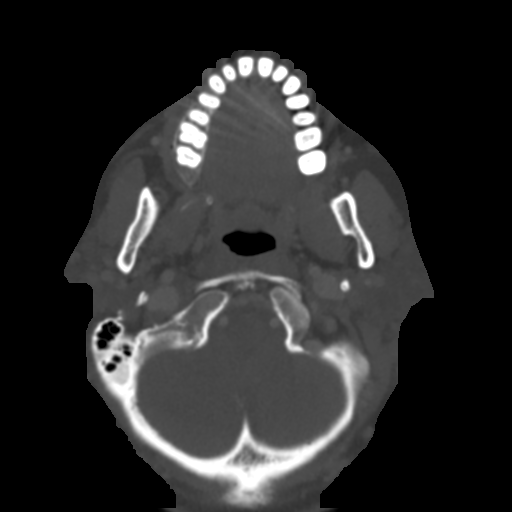

[15 of 33 positions shown; findings below may reference images not displayed]

RADIATION DOSE REDUCTION: This exam was performed according to the
departmental dose-optimization program which includes automated
exposure control, adjustment of the mA and/or kV according to
patient size and/or use of iterative reconstruction technique.

CONTRAST:  75mL Q2LNP1-T77 IOPAMIDOL (Q2LNP1-T77) INJECTION 61%
FINDINGS: Pharynx and larynx: Unremarkable.  No mass or swelling.

Salivary glands: Parotid and submandibular glands are unremarkable.

Thyroid: Normal.

Lymph nodes: No enlarged or abnormal density nodes.

Vascular: Major neck vessels are patent.

Limited intracranial: No abnormal enhancement.

Visualized orbits: Unremarkable.

Mastoids and visualized paranasal sinuses: Partial posterior ethmoid
opacification bilaterally. Mild lobular maxillary sinus mucosal
thickening. Mastoid air cells are clear.

Skeleton: Prior ACDF at C5-C6. Degenerative changes of the cervical
spine.

Upper chest: Included upper lungs are clear.

Other: None.
IMPRESSION: No neck mass or adenopathy.

## 2024-02-08 ENCOUNTER — Encounter: Payer: Self-pay | Admitting: Nurse Practitioner

## 2024-02-11 ENCOUNTER — Institutional Professional Consult (permissible substitution): Admitting: Neurology

## 2024-02-17 ENCOUNTER — Ambulatory Visit (INDEPENDENT_AMBULATORY_CARE_PROVIDER_SITE_OTHER): Admitting: Neurology

## 2024-02-17 ENCOUNTER — Encounter: Payer: Self-pay | Admitting: Neurology

## 2024-02-17 VITALS — BP 130/76 | HR 63 | Ht 63.75 in | Wt 201.0 lb

## 2024-02-17 DIAGNOSIS — G43E01 Chronic migraine with aura, not intractable, with status migrainosus: Secondary | ICD-10-CM | POA: Insufficient documentation

## 2024-02-17 DIAGNOSIS — E6609 Other obesity due to excess calories: Secondary | ICD-10-CM | POA: Insufficient documentation

## 2024-02-17 DIAGNOSIS — G478 Other sleep disorders: Secondary | ICD-10-CM

## 2024-02-17 DIAGNOSIS — G44009 Cluster headache syndrome, unspecified, not intractable: Secondary | ICD-10-CM | POA: Insufficient documentation

## 2024-02-17 DIAGNOSIS — E66812 Obesity, class 2: Secondary | ICD-10-CM

## 2024-02-17 DIAGNOSIS — Z6835 Body mass index (BMI) 35.0-35.9, adult: Secondary | ICD-10-CM

## 2024-02-17 DIAGNOSIS — Z9189 Other specified personal risk factors, not elsewhere classified: Secondary | ICD-10-CM | POA: Diagnosis not present

## 2024-02-17 DIAGNOSIS — G4719 Other hypersomnia: Secondary | ICD-10-CM | POA: Insufficient documentation

## 2024-02-17 DIAGNOSIS — R42 Dizziness and giddiness: Secondary | ICD-10-CM | POA: Insufficient documentation

## 2024-02-17 NOTE — Progress Notes (Signed)
 @[facility]   Provider:  Dedra Gores, MD    Primary Care Physician:  Larnell Hamilton, MD [address] [place] [address]    Referring Provider: Ines Onetha NOVAK, Md [address] [place],  [place] [address]        Chief Concern for this Consultation:   Patient presents with     She states that she wakes up with headaches. She has teeth grinding/clenching. She had sz for upper airway resistance. She states that on average she gets a good amount of sleep. She wakes up for bathroom 1-4 times a night but able to go back to sleep. Sometimes she wakes up with leg cramps. Complains of feeling tired no matter how much sleep she gets. last SS 2015 and was dg with upper airway resistance syndrome.      HPI: I have the pleasure of meeting with Helen Jackson, on 02-17-2024, who is a 63 y.o. female patient,  seen upon a referral by Dr Ines for a  Sleep Medicine Consultation.  The patient's referral information asked for an evaluation of  severe daily fatigue and headaches by sleep study .  Chief concern according to patient:   [name] has some nights leg cramps, has no trouble to fall asleep and stay asleep, and if waking up she is getting easily back to sleep- she describes some night  and has a sensation of inner vibration. She has PVCs and takes MVP. She states that she wakes up with headaches. She has teeth grinding/clenching and just saw her dentist who encouraged a sleep study. She had a Dx for upper airway resistance.  She states that on average she gets a good amount of sleep. She wakes up for bathroom 1-4 times a night but able to go back to sleep.  Sometimes she wakes up with leg cramps.  Complains of feeling tired no matter how much sleep she gets. last SS in 2015  referred by cardiology, and was dx with upper airway resistance syndrome.      has a past medical history of Arthritis, Celiac disease, Colitis, Complication of anesthesia, Diverticulosis, Elevated alkaline  phosphatase level, Fatty liver, Fibroid, Heart murmur, Hot flashes (2025), Lichen planus, Migraines, MVP (mitral valve prolapse), Ocular rosacea, Peripheral neuropathy, Pernicious anemia, Seizures (HCC), Upper airway resistance syndrome, and Upper respiratory disease.  Sleep relevant medical/ surgical and symptom history: The patient reports onset of symptoms over a time period of 24 months following a vertigo spell and from thereon had migraine exacerbation and fatigue. Obesity  with more weight gain over 24 months.    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   This patient had a previous sleep study/ studies in the year 2015, at the time no OSA but  UARS.   Family medical history: There are no biological family members affected by Sleep apnea . Narcolepsy affected maternal  cousins and uncle.      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  One adult child.   The workplace involves neither physical activity, nor outdoor activity, travel.   Nicotine use: never .  ETOH use: none ,  Caffeine intake in form of: Coffee (in AM 1 mug), Soft drinks (/), Tea ( /) no  Energy drinks ( including those containing  taurine ).  Caffeine is last consumed at 7.30 AM. .  Exercises regularly in form of walking- but she feels often too tired . Last year she had such  vertigo and  fatigue she couldn't  move .  Vertigo and fatigue affected Hobbies /Volunteering=  not a member of a club, church or other community memberships and engagements      Sleep habits and routines are as follows: The patient's dinner time is around 6 PM.   Evening time is spent by reading, TV. The patient goes to bed at, or close to 9.00- 10.00 PM.  Often falls asleep before bedtime- The bedroom is shared with spouse and is described as cool, quiet, and dark.  The patient reports that it takes 10-15  minutes to fall asleep, then continues to sleep for 2-4 hours uninterrupted , then woken up by cramps , often also  woken by headaches, no longer every night , but every other week 6-7 times- cluster HA,  sharp or stabbing.  :  the sense of inner vibration and 1-4 times by the need to void (Nocturia).   The preferred sleep position is left side , with support of 2 pillows, (non- adjustable bed/ recliner ).  The total estimated sleep time is circa 8 hours.   Dreams are reportedly  frequent/ and can be vivid.  Dream enactment has only been recently  reported.   No sleep paralysis has been experienced.     6.15 AM is the usual week- day rise time. The patient wakes up spontaneously at 5.30-6 AM .  She reports not feeling refreshed and restored in the morning, waking with symptoms such as dry mouth, morning headaches, stiffness or pain, and fatigue- the fatigue gets worse as the day goes on.    Naps in daytime are taken frequently (there is a desire to nap and opportunity and she fights it ), lasting from 5 to 15 and have no refreshing quality.  These do not interfere with nocturnal sleep.    Review of Systems: Out of a complete 14 system review, the patient complains of only the following symptoms, and all other reviewed systems are negative.:  Hypersomnia- yes   Insomnia none   Depression/ anxiety: none   Pain: yes, cramps.   Headaches yes     Snoring, Sleep fragmentation, Nocturia   How likely are you to doze in the following situations: 0 = not likely, 1 = slight chance, 2 = moderate chance, 3 = high chance Sitting and Reading? Watching Television? Sitting inactive in a public place (theater or meeting)? As a passenger in a car for an hour  without a break? Lying down in the afternoon when circumstances permit? Sitting and talking to someone? Sitting quietly after lunch without alcohol? In a car, while stopped for a few minutes in traffic?   Total ESS =12 / 24 points.    FSS endorsed at 40/ 63 points.  GDS: 1/15  Social History   Socioeconomic History   Marital status: Married    Spouse name: Lamar    Number of children: 1   Years of education: 16   Highest education level: Not on file  Occupational History   Occupation: [employer]   Tobacco Use   Smoking status: Never   Smokeless tobacco: Never  Vaping Use   Vaping  status: Never Used  Substance and Sexual Activity   Alcohol use: No    Alcohol/week: 0.0 standard drinks of alcohol   Drug use: No   Sexual activity: Yes    Partners: Male    Birth control/protection: Post-menopausal  Other Topics Concern   Not on file  Social History Narrative   Lives with spouse and son   Caffeine use: 1 cup/day coffee, occasional coca cola   Social Drivers of Corporate investment banker Strain: Not on file  Food Insecurity: No Food Insecurity (01/19/2024)   [facility]   Hunger Vital Sign    Within the past 12 months, you worried that your food would run out before you got the money to buy more.: Never true    Within the past 12 months, the food you bought just didn't last and you didn't have money to get more.: Never true  Transportation Needs: No Transportation Needs (01/19/2024)   Received [facility] - Transportation    Lack of Transportation (Medical): No    Lack of Transportation (Non-Medical): No  Physical Activity: Not on file  Stress: Not on file  Social Connections: Not on file    Family History  Problem Relation Age of Onset   Osteoporosis Mother    CVA Mother    Dementia Mother    Stroke Mother    Cancer Father        pancreatic   Breast cancer Paternal Aunt    Chorea Maternal Grandmother      Past Medical History:  Diagnosis Date   Arthritis    Celiac disease    Colitis    Complication of anesthesia    difficulty waking up   Diverticulosis    Elevated alkaline phosphatase level    Fatty liver    Fibroid    Heart murmur    Dr. Burnard bijou follows   Hot flashes 2025   Lichen planus    Migraines    MVP (mitral valve prolapse)    Ocular rosacea    Peripheral neuropathy    Pernicious anemia    injections every 4 to 6 weeks -last injestion 09-15-14   Seizures (HCC)    see notes in anesthesia   Upper airway resistance syndrome    After sleep study   Upper respiratory disease    upper respiratory airway disease    Past Surgical History:  Procedure Laterality Date   ANKLE SURGERY     CESAREAN SECTION     COLONOSCOPY WITH PROPOFOL  N/A 11/28/2014   Procedure: COLONOSCOPY WITH PROPOFOL ;  Surgeon: Renaye Sous, MD;  Location: [facility];  Service: Endoscopy;  Laterality: N/A;   ESOPHAGOGASTRODUODENOSCOPY (EGD) WITH PROPOFOL  N/A 11/28/2014   Procedure: ESOPHAGOGASTRODUODENOSCOPY (EGD) WITH PROPOFOL ;  Surgeon: Renaye Sous, MD;  Location: [facility];  Service: Endoscopy;  Laterality: N/A;   INCISION AND DRAINAGE     bartholin cyst   KNEE SURGERY     bilateral   MOHS SURGERY Left    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 Swallow whole. 90 tablet 3   Cholecalciferol (VITAMIN D3) 1.25 MG (50000 UT) CAPS Take 50,000 Units by mouth every Sunday.      cyanocobalamin (VITAMIN B12) 1000 MCG/ML injection Inject into the muscle.     EPIPEN  2-PAK 0.3  MG/0.3ML SOAJ injection Inject 0.3 mg as directed as needed (for allergies/never used). Reported on 08/30/2015     metoprolol  succinate (TOPROL -XL) 25 MG 24 hr tablet TAKE 1 TABLET BY MOUTH EVERY DAY WITH OR IMMEDIATELY FOLLOWING A MEAL 90 tablet 0   metroNIDAZOLE (METROGEL) 1 % gel Apply 1 application topically at bedtime as needed (irritation).      [name] SURECLICK 140 MG/ML SOAJ Inject into the skin.     sodium chloride  0.9 % SOLN 100 mL with Eptinezumab-jjmr 100 MG/ML SOLN 100 mg Inject 100 mg into the vein every 3 (three) months. (Patient not taking: Reported on 01/19/2024) 100 mg 4   No current facility-administered medications on file prior to visit.    Allergies  Allergen Reactions   Gentamicin Anaphylaxis   Influenza Vaccines Anaphylaxis   Other Anaphylaxis and Other (See Comments)    Almond-   Almond Oil     almonds   Amoxicillin Hives   Doxycycline     Cant remember   Egg-Derived Products Nausea And Vomiting    11-20-14 Pt. States had anaphylaxis with flu vaccine shot with egg based   Hydrocodone-Acetaminophen  Nausea And Vomiting    VIOLENTLY SHAKES   Ibuprofen  Itching   Indomethacin Other (See Comments)    Stomach pain & black stool   Influenza Virus Vaccine     Other Reaction(s): Unknown   Morphine Nausea And Vomiting and Other (See Comments)    VIOLENTLY SHAKES   Pyridium [Phenazopyridine Hcl]     Cant remember   Soybean-Containing Drug Products Other (See Comments)    Swelling of the tongue   Soybeans [Soybean Oil]    Tetracycline Other (See Comments)   Tetracyclines & Related Hives    swelling   Tramadol Itching    severe   Wheat     Cant remember   Erythromycin Base Rash    Vitals:   02/17/24 0956  BP: 130/76  Pulse: 63   BMI 34.77 ,  200 lbs in clothing, height  is 5.4   Physical exam:   General: The patient was alert and appears not in acute distress.  Mood and affect are appropriate .  The patient's interactions are: Cooperative, makes eye  contact, follows the instructions and answers questions coherently.  The patient is groomed and appropriately groomed and dressed. Head: Normocephalic, atraumatic.  Neck is supple. Mallampati: 3.  The neck circumference measured 15. 5 inches. Nasal airflow was patent ,  post surgery.  Overbite / Retrognathia was noted.  Dental status: biological  Cardiovascular:  Regular rate and cardiac rhythm by palpable pulse. Respiratory: no audible wheezing, no tachypnoea.   Skin:  Without evidence of ankle edema. No discoloration.  Trunk:  BMI is 34.8 The patient's posture was erect.   Neurologic exam : The patient was awake and alert, oriented to place and time.   Attention span & concentration ability appeared normal.  Speech was fluent, without dysarthria, dysphonia or aphasia, and of normal volume.     Cranial nerves:  There was no loss of smell or taste reported  Pupil are  different in size and both round,  only right briskly reactive to light.  Left is larger and  esotropia is noted)    Funduscopic exam was deferred.  Extraocular movements in vertical and horizontal planes were intact . (No Diplopia reported). Visual fields by finger perimetry are intact. Hearing was intact to soft voice.    Facial sensation intact to fine touch.  Facial motor strength: Symmetric movement and tongue and uvula move midline.  Neck ROM: rotation, tilt and flexion extension were intact for age and shoulder shrug was symmetrical.    Motor exam:  Symmetric bulk, strength and ROM.   Normal tone without cog- wheeling, and symmetric grip strength.   Sensory:  intact.  Proprioception tested in the upper extremities was normal.   Coordination: The Finger-to-nose maneuver was intact without evidence of ataxia, there was mild dysmetria on first attempt on the right side  , neither side wihth tremor.   Gait and station: Patient could rise unassisted from a seated position, without bracing, and walked without  assistive device.  Deep tendon reflexes: Upper extremities did show symmetric DTRs. Lower extremity DTRs were symmetric and brisk/ attenuated.   Babinski response was deferred normal .     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   ASSESSMENT :   This patient reports that 25 months ago she began gaining more weight she was physically less active because of vertigo, felt miserable and also started to become more more fatigued.  No matter how long she slept she felt never restored or refreshed.    At the same time she developed very severe and frequent migraines these were not a new condition but the exacerbation of her pre-existing migraine condition.    Her migraines also manifest as clusters at night and wake her up from sleep she also has morning headaches upon waking.    She does have variable frequency of nocturia, she no longer is a loud snorer after a turbinectomy under Dr. Tyler guidance.   She does have some autoimmune conditions including ulcerative colitis and sees tend to increase inflammatory markers and also increased chronic fatigue. Another concern as a fairly new onset of clenching and in a sensation of vibration at night plus she does have frequent leg cramps waking her.   Risk factors for OSA were present,  including : Body mass index is 34.77 kg/m., neck size and upper airway anatomy.      My Plan is to proceed with:  Split at [facility], early arrival for patient with EDS, obesity dx of UARS, cluster HA - but  not snoring.   I would like to invite this patient for an in-lab sleep study.  This has 2 reasons #1 she has carried a diagnosis of upper airway resistance syndrome since 2015 but never qualified before for obstructive sleep apnea diagnosis with a weight gain this may well be the case now.  In spite of not snoring  as loudly she can still have apnea.  #2 the leg cramps the sensation of vibration could relate to a secondary sleep disorder and then there is a cluster headache history which is more prone in patients that experience nocturnal hypoxia.  For this reason I will order a polysomnography in the lab which allows me to collect data for all these conditions.  PSG; SPLIT at [facility]/ h.  RV in 4 months with Np or me    A total time  of  45  minutes consistent of a part of face to face encounter , exam and interview,  and additional preparation time for chart review was spent .  At today's visit, we discussed treatment options, associated risk and benefits, and engage in counseling as needed including, but not limited to:  Sleep hygiene, Quality Sleep Habits, and Safety concerns for patients with daytime sleepiness who are warned to not operate machinery/ motor vehicles when drowsy. Risk factors for sleep apnea were identified:.story, family and social background, as well as relevant laboratory results, imaging findings, and medical notes, where applicable.  This note was generated by myself in part by using dictation software, and as a result, it may contain unintentional typos and errors.  Nevertheless, effort was made to accurately convey the pertinent aspects of the patient's visit.   Dedra Gores, MD  [facility] and [facility] Sleep Board certified in Sleep Medicine by The [employer] of Sleep Medicine and Diplomate of the [employer] of [employer] (AASM) . Board certified In Neurology, Diplomat of the ABPN,  Fellow of the [employer] of Neu[employer].

## 2024-02-17 NOTE — Patient Instructions (Addendum)
 Hypersomnia Hypersomnia is a condition in which a person feels very tired during the day even though the person gets plenty of sleep at night. A person with this condition may take naps during the day and may find it very difficult to wake up from sleep. Hypersomnia may affect a person's ability to think, concentrate, drive, or remember things. What are the causes? The cause of this condition may not be known. Possible causes include: Taking certain medicines. Using drugs or alcohol. Sleep disorders, such as narcolepsy and sleep apnea. Injury to the head, brain, or spinal cord. Tumors. Certain medical conditions. These include: Depression. Diabetes. Gastroesophageal reflux disease (GERD). An underactive thyroid  gland (hypothyroidism). What are the signs or symptoms? The main symptoms of hypersomnia include: Feeling very tired throughout the day, regardless of how much sleep you got the night before. Having trouble waking up. Others may find it difficult to wake you up when you are sleeping. Sleeping for longer and longer periods at a time. Taking naps throughout the day. Other symptoms may include: Feeling restless, anxious, or annoyed. Lacking energy. Having trouble with: Remembering. Speaking. Thinking. Loss of appetite. Seeing, hearing, tasting, smelling, or feeling things that are not real (hallucinations). How is this diagnosed? This condition may be diagnosed based on: Your symptoms and medical history. Your sleeping habits. Your health care provider may ask you to write down your sleeping habits in a daily sleep log, along with any symptoms you have. A series of tests that are done while you sleep (sleep study or polysomnogram). A test that measures how quickly you can fall asleep during the day (daytime nap study or multiple sleep latency test). How is this treated? This condition may be treated by: Following a regular sleep routine. Making lifestyle changes, such  as changing your eating habits, getting regular exercise, and avoiding alcohol or caffeinated beverages. Taking medicines to make you more alert (stimulants) during the day. Treating any underlying medical causes of hypersomnia. Follow these instructions at home: Sleep habits Stick to a routine that includes going to bed and waking up at the same times every day and night. Practice a relaxing bedtime routine. This may include reading, meditation, deep breathing, or taking a warm bath before going to sleep. Exercise regularly as told by your health care provider. However, avoid exercising in the hours right before bedtime. Keep your sleep environment at a cooler temperature, darkened, and quiet. Sleep with pillows and a mattress that are comfortable and supportive. Schedule short 20-minute naps for when you feel sleepiest during the day. Talk with your employer or teachers about your hypersomnia. If possible, adjust your schedule so that: You have a regular daytime work schedule. You can take a scheduled nap during the day. You do not have to work or be active at night. Do not eat a heavy meal for a few hours before bedtime. Eat your meals at about the same times every day. Safety  Do not drive or use machinery if you are sleepy. Ask your health care provider if it is safe for you to drive. Wear a life jacket when swimming or spending time near water. General instructions  Take over-the-counter and prescription medicines only as told by your health care provider. This includes supplements. Avoid drinking alcohol or caffeinated beverages. Keep a sleep log that will help your health care provider manage your condition. This may include information about: What time you go to bed each night. How often you wake up at night. How many  hours you sleep at night. How often and for how long you nap during the day. Any observations from others, such as leg movements during sleep, sleep walking, or  snoring. Keep all follow-up visits. This is important. Contact a health care provider if: You have new symptoms. Your symptoms get worse. Get help right away if: You have thoughts about hurting yourself or someone else. Get help right away if you feel like you may hurt yourself or others, or have thoughts about taking your own life. Go to your nearest emergency room or: Call 911. Call the National Suicide Prevention Lifeline at (909) 397-8830 or 988. This is open 24 hours a day. Text the Crisis Text Line at (828)209-1184. Summary Hypersomnia refers to a condition in which you feel very tired during the day even though you get plenty of sleep at night. A person with this condition may take naps during the day and may find it very difficult to wake up from sleep. Hypersomnia may affect a person's ability to think, concentrate, drive, or remember things. Treatment may include a regular sleep routine and making some lifestyle changes. This information is not intended to replace advice given to you by your health care provider. Make sure you discuss any questions you have with your health care provider. Document Revised: 05/13/2021 Document Reviewed: 05/13/2021 Elsevier Patient Education  2024 Elsevier Inc.Split at [facility], early arrival for patient with EDS, obesity dx of UARS, cluster HA - but  not snoring.

## 2024-02-22 ENCOUNTER — Telehealth: Payer: Self-pay | Admitting: Neurology

## 2024-02-22 DIAGNOSIS — G43E01 Chronic migraine with aura, not intractable, with status migrainosus: Secondary | ICD-10-CM

## 2024-02-22 DIAGNOSIS — G478 Other sleep disorders: Secondary | ICD-10-CM

## 2024-02-22 DIAGNOSIS — E6609 Other obesity due to excess calories: Secondary | ICD-10-CM

## 2024-02-22 DIAGNOSIS — Z9189 Other specified personal risk factors, not elsewhere classified: Secondary | ICD-10-CM

## 2024-02-22 NOTE — Telephone Encounter (Signed)
 Split [employer] pending.

## 2024-02-24 NOTE — Telephone Encounter (Signed)
 [employer] denied the Split night sleep study. See below for denial reasons. Would you like to order a HST?

## 2024-02-24 NOTE — Telephone Encounter (Signed)
Home sleep test ordered 

## 2024-02-24 NOTE — Addendum Note (Signed)
 Addended by: DELFINO AUGUSTIN BROCKS on: 02/24/2024 08:18 AM   Modules accepted: Orders

## 2024-03-15 ENCOUNTER — Ambulatory Visit (INDEPENDENT_AMBULATORY_CARE_PROVIDER_SITE_OTHER): Admitting: Neurology

## 2024-03-15 DIAGNOSIS — R002 Palpitations: Secondary | ICD-10-CM

## 2024-03-15 DIAGNOSIS — Z9189 Other specified personal risk factors, not elsewhere classified: Secondary | ICD-10-CM

## 2024-03-15 DIAGNOSIS — G478 Other sleep disorders: Secondary | ICD-10-CM

## 2024-03-15 DIAGNOSIS — G4733 Obstructive sleep apnea (adult) (pediatric): Secondary | ICD-10-CM

## 2024-03-15 DIAGNOSIS — G43E01 Chronic migraine with aura, not intractable, with status migrainosus: Secondary | ICD-10-CM

## 2024-03-15 DIAGNOSIS — E6609 Other obesity due to excess calories: Secondary | ICD-10-CM

## 2024-03-17 NOTE — Progress Notes (Signed)
 [facility] KIRSTEIN BAXLEY 63 year old female [birth year]   HOME SLEEP TEST REPORT ( by Watch PAT)   STUDY DATE:  03-15-2024/ data received 03-17-2024   ORDERING CLINICIAN:  Dedra Gores, MD  REFERRING CLINICIAN:  Dr Onetha Epp ,MD   Dr Glendia Freeman , GMA    CLINICAL INFORMATION/HISTORY:  Dr Sharion patient was referred based on her history of chronic  intractable migraine, ENT surgery, non restorative sleep and being at risk for OSA. ENT surgery (Sinusitis  reduced after right turbinate reduction, snoring : surgery helped , and had childhood Tonsillectomy,  Sleep talking, Autoimmune disorders- pernicious Anemia, ulcerative colitis,  Covid- had CT scan and was discovered to have CAD with calcium  deposits, now on [name] - cholesterolemia. Vyepti.    Epworth sleepiness score: 12 / 24 points.    FSS endorsed at 40/ 63 points.  GDS: 1/15    BMI:  34.8 kg/m   Neck Circumference:  15.5''      Sleep Summary:   Total Recording Time (hours, min):     8 hours and 6 minutes   Total Sleep Time (hours, min):    6 hours and 2 minutes              Percent REM (%):         28%                                Respiratory Indices: CMS    Calculated pAHI (per hour):   4.5 /h                          REM pAHI:  3.3/h                                               NREM pAHI:     4.9/h      Supine sleep AHI 8/h and non supine AHI was < 2/h.                      Snoring was mild, 41 dB mean Volume.                                             Oxygen Saturation Statistics:   Oxygen Saturation (%) Mean:      95%         O2 Saturation Range (%):    between 89 and 100 bpm                                    O2 Saturation (minutes) <89%:  0 minutes         Pulse Rate Statistics:   Pulse Mean (bpm):   68 bpm              Pulse Range:   between 54 and 101 BPM,.   There is no information about the heart rhythm provided, the watch pat device does not  provide these data .  IMPRESSION:  This HST  did not detect sleep apnea to be present at a degree that warrants intervention- the AHI was < 5/h. There were  no hypoxia or bradycardia  present.    RECOMMENDATION:  A follow up visit with Sleep Neurology is not needed.      INTERPRETING PHYSICIAN:  Dedra Gores, MD  [facility] and [facility] Sleep Board certified in Sleep Medicine by [employer] and Diplomate of the [employer] ([employer]ASM) . Board certified In Neurology, Diplomat of the ABPN,  Fellow of the [employer].

## 2024-03-21 ENCOUNTER — Ambulatory Visit: Payer: Self-pay | Admitting: Neurology

## 2024-03-21 NOTE — Procedures (Signed)
 [facility] MIAMI LATULIPPE 63 year old female 01-19-1961   HOME SLEEP TEST REPORT ( by Watch PAT)   STUDY DATE:  03-15-2024/ data received 03-17-2024   ORDERING CLINICIAN:  Dedra Gores, MD  REFERRING CLINICIAN:  Dr Onetha Epp ,MD   Dr Glendia Freeman , GMA    CLINICAL INFORMATION/HISTORY:  Dr Sharion patient was referred based on her history of chronic  intractable migraine, ENT surgery, non restorative sleep and being at risk for OSA. ENT surgery (Sinusitis  reduced after right turbinate reduction, snoring : surgery helped , and had childhood Tonsillectomy,  Sleep talking, Autoimmune disorders- pernicious Anemia, ulcerative colitis,  Covid- had CT scan and was discovered to have CAD with calcium  deposits, now on [name] - cholesterolemia. Vyepti.    Epworth sleepiness score: 12 / 24 points.    FSS endorsed at 40/ 63 points.  GDS: 1/15    BMI:  34.8 kg/m   Neck Circumference:  15.5''      Sleep Summary:   Total Recording Time (hours, min):     8 hours and 6 minutes   Total Sleep Time (hours, min):    6 hours and 2 minutes              Percent REM (%):         28%                                Respiratory Indices: CMS    Calculated pAHI (per hour):   4.5 /h                          REM pAHI:  3.3/h                                               NREM pAHI:     4.9/h      Supine sleep AHI 8/h and non supine AHI was < 2/h.                      Snoring was mild, 41 dB mean Volume.                                             Oxygen Saturation Statistics:   Oxygen Saturation (%) Mean:      95%         O2 Saturation Range (%):    between 89 and 100 bpm                                    O2 Saturation (minutes) <89%:  0 minutes         Pulse Rate Statistics:   Pulse Mean (bpm):   68 bpm              Pulse Range:   between 54 and 101 BPM,.   There is no information about the heart rhythm provided, the watch pat device does not provide  these data .  IMPRESSION:  This HST  did not detect sleep apnea to be present at a degree that warrants intervention- the AHI was < 5/h. There were  no hypoxia or bradycardia  present.    RECOMMENDATION:  A follow up visit with Sleep Neurology is not needed.      INTERPRETING PHYSICIAN:  Dedra Gores, MD  [facility] and [facility] Sleep Board certified in Sleep Medicine by [employer] and Diplomate of the [employer] (AASM) . Board certified In Neurology, Diplomat of the ABPN,  Fellow of the [employer].

## 2024-03-23 ENCOUNTER — Other Ambulatory Visit: Payer: Self-pay | Admitting: Nurse Practitioner

## 2024-04-19 ENCOUNTER — Other Ambulatory Visit: Payer: Self-pay | Admitting: Nurse Practitioner

## 2024-05-23 ENCOUNTER — Telehealth: Payer: Self-pay | Admitting: Nurse Practitioner

## 2024-05-23 NOTE — Telephone Encounter (Signed)
 Patient came in to schedule her apt with a provider since Dr Burnard retired. Reached out to Scheduler for sleep studies. Informed pt someone would reach out to schedule.

## 2024-05-23 NOTE — Telephone Encounter (Signed)
 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 Follow up scheduled with APP.

## 2024-07-05 ENCOUNTER — Other Ambulatory Visit: Payer: Self-pay | Admitting: Orthopaedic Surgery

## 2024-07-05 DIAGNOSIS — M25571 Pain in right ankle and joints of right foot: Secondary | ICD-10-CM

## 2024-07-06 ENCOUNTER — Ambulatory Visit
Admission: RE | Admit: 2024-07-06 | Discharge: 2024-07-06 | Disposition: A | Discharge status: Home / Self Care | Source: Ambulatory Visit | Attending: Orthopaedic Surgery | Admitting: Orthopaedic Surgery

## 2024-07-06 DIAGNOSIS — M25571 Pain in right ankle and joints of right foot: Secondary | ICD-10-CM

## 2024-07-22 ENCOUNTER — Other Ambulatory Visit: Payer: Self-pay | Admitting: Orthopaedic Surgery

## 2024-07-28 ENCOUNTER — Ambulatory Visit: Admitting: Neurology

## 2024-07-29 ENCOUNTER — Ambulatory Visit: Admitting: Nurse Practitioner

## 2024-08-11 ENCOUNTER — Ambulatory Visit (HOSPITAL_BASED_OUTPATIENT_CLINIC_OR_DEPARTMENT_OTHER): Admit: 2024-08-11 | Admitting: Orthopaedic Surgery

## 2024-08-11 ENCOUNTER — Encounter (HOSPITAL_BASED_OUTPATIENT_CLINIC_OR_DEPARTMENT_OTHER): Payer: Self-pay

## 2025-01-19 ENCOUNTER — Ambulatory Visit: Admitting: Nurse Practitioner
